# Patient Record
Sex: Female | Born: 1960 | Race: Black or African American | Hispanic: No | Marital: Married | Smoking: Current every day smoker
Health system: Southern US, Community
[De-identification: ages and names within clinical notes are randomized; demographics above are authoritative.]

## PROBLEM LIST (undated history)

## (undated) ENCOUNTER — Emergency Department (HOSPITAL_COMMUNITY): Admission: EM | Payer: 59

## (undated) ENCOUNTER — Emergency Department (HOSPITAL_COMMUNITY): Admission: EM | Payer: BLUE CROSS/BLUE SHIELD | Source: Home / Self Care

## (undated) DIAGNOSIS — R42 Dizziness and giddiness: Secondary | ICD-10-CM

## (undated) DIAGNOSIS — E785 Hyperlipidemia, unspecified: Secondary | ICD-10-CM

## (undated) DIAGNOSIS — K859 Acute pancreatitis without necrosis or infection, unspecified: Secondary | ICD-10-CM

## (undated) DIAGNOSIS — I82409 Acute embolism and thrombosis of unspecified deep veins of unspecified lower extremity: Secondary | ICD-10-CM

## (undated) HISTORY — DX: Acute embolism and thrombosis of unspecified deep veins of unspecified lower extremity: I82.409

## (undated) HISTORY — DX: Hyperlipidemia, unspecified: E78.5

## (undated) HISTORY — PX: ENDOMETRIAL ABLATION W/ NOVASURE: SUR434

## (undated) HISTORY — PX: TUBAL LIGATION: SHX77

---

## 1998-02-18 ENCOUNTER — Inpatient Hospital Stay (HOSPITAL_COMMUNITY): Admission: AD | Admit: 1998-02-18 | Discharge: 1998-02-18 | Payer: Self-pay | Admitting: Obstetrics & Gynecology

## 2000-01-07 ENCOUNTER — Emergency Department (HOSPITAL_COMMUNITY): Admission: EM | Admit: 2000-01-07 | Discharge: 2000-01-07 | Payer: Self-pay | Admitting: Emergency Medicine

## 2001-02-04 ENCOUNTER — Encounter: Payer: Self-pay | Admitting: Emergency Medicine

## 2001-02-04 ENCOUNTER — Emergency Department (HOSPITAL_COMMUNITY): Admission: EM | Admit: 2001-02-04 | Discharge: 2001-02-04 | Payer: Self-pay | Admitting: Emergency Medicine

## 2001-02-15 ENCOUNTER — Encounter: Admission: RE | Admit: 2001-02-15 | Discharge: 2001-02-15 | Payer: Self-pay | Admitting: Family Medicine

## 2001-02-15 ENCOUNTER — Other Ambulatory Visit: Admission: RE | Admit: 2001-02-15 | Discharge: 2001-02-15 | Payer: Self-pay | Admitting: Family Medicine

## 2001-02-23 ENCOUNTER — Encounter: Admission: RE | Admit: 2001-02-23 | Discharge: 2001-05-24 | Payer: Self-pay | Admitting: Family Medicine

## 2001-03-10 ENCOUNTER — Encounter: Admission: RE | Admit: 2001-03-10 | Discharge: 2001-03-10 | Payer: Self-pay | Admitting: Family Medicine

## 2001-07-09 ENCOUNTER — Encounter: Admission: RE | Admit: 2001-07-09 | Discharge: 2001-07-09 | Payer: Self-pay | Admitting: Family Medicine

## 2001-07-14 ENCOUNTER — Encounter: Admission: RE | Admit: 2001-07-14 | Discharge: 2001-07-14 | Payer: Self-pay | Admitting: Family Medicine

## 2002-01-31 ENCOUNTER — Encounter: Admission: RE | Admit: 2002-01-31 | Discharge: 2002-01-31 | Payer: Self-pay | Admitting: Family Medicine

## 2002-02-03 ENCOUNTER — Encounter: Admission: RE | Admit: 2002-02-03 | Discharge: 2002-02-03 | Payer: Self-pay | Admitting: Family Medicine

## 2002-03-11 ENCOUNTER — Encounter: Admission: RE | Admit: 2002-03-11 | Discharge: 2002-03-11 | Payer: Self-pay | Admitting: Family Medicine

## 2002-07-15 ENCOUNTER — Encounter: Admission: RE | Admit: 2002-07-15 | Discharge: 2002-07-15 | Payer: Self-pay | Admitting: Family Medicine

## 2002-10-06 ENCOUNTER — Encounter: Admission: RE | Admit: 2002-10-06 | Discharge: 2002-10-06 | Payer: Self-pay | Admitting: Family Medicine

## 2002-10-11 ENCOUNTER — Encounter: Payer: Self-pay | Admitting: Sports Medicine

## 2002-10-11 ENCOUNTER — Encounter: Admission: RE | Admit: 2002-10-11 | Discharge: 2002-10-11 | Payer: Self-pay | Admitting: Sports Medicine

## 2002-11-08 ENCOUNTER — Encounter: Admission: RE | Admit: 2002-11-08 | Discharge: 2002-11-08 | Payer: Self-pay | Admitting: Sports Medicine

## 2002-11-10 ENCOUNTER — Other Ambulatory Visit: Admission: RE | Admit: 2002-11-10 | Discharge: 2002-11-10 | Payer: Self-pay | Admitting: *Deleted

## 2002-11-22 ENCOUNTER — Ambulatory Visit (HOSPITAL_COMMUNITY): Admission: RE | Admit: 2002-11-22 | Discharge: 2002-11-22 | Payer: Self-pay | Admitting: *Deleted

## 2002-11-22 ENCOUNTER — Encounter (INDEPENDENT_AMBULATORY_CARE_PROVIDER_SITE_OTHER): Payer: Self-pay | Admitting: *Deleted

## 2002-11-29 ENCOUNTER — Encounter: Admission: RE | Admit: 2002-11-29 | Discharge: 2002-11-29 | Payer: Self-pay | Admitting: Family Medicine

## 2002-11-29 ENCOUNTER — Inpatient Hospital Stay (HOSPITAL_COMMUNITY): Admission: AD | Admit: 2002-11-29 | Discharge: 2002-11-29 | Payer: Self-pay | Admitting: Obstetrics and Gynecology

## 2002-11-30 ENCOUNTER — Encounter: Admission: RE | Admit: 2002-11-30 | Discharge: 2002-11-30 | Payer: Self-pay | Admitting: Family Medicine

## 2002-12-08 ENCOUNTER — Encounter: Admission: RE | Admit: 2002-12-08 | Discharge: 2002-12-08 | Payer: Self-pay | Admitting: Family Medicine

## 2003-07-14 ENCOUNTER — Encounter: Admission: RE | Admit: 2003-07-14 | Discharge: 2003-07-14 | Payer: Self-pay | Admitting: Family Medicine

## 2003-08-07 ENCOUNTER — Encounter: Admission: RE | Admit: 2003-08-07 | Discharge: 2003-08-07 | Payer: Self-pay | Admitting: Obstetrics and Gynecology

## 2003-08-18 ENCOUNTER — Encounter: Admission: RE | Admit: 2003-08-18 | Discharge: 2003-08-18 | Payer: Self-pay | Admitting: Sports Medicine

## 2003-09-08 ENCOUNTER — Encounter (INDEPENDENT_AMBULATORY_CARE_PROVIDER_SITE_OTHER): Payer: Self-pay | Admitting: *Deleted

## 2003-09-08 ENCOUNTER — Ambulatory Visit (HOSPITAL_COMMUNITY): Admission: RE | Admit: 2003-09-08 | Discharge: 2003-09-08 | Payer: Self-pay | Admitting: *Deleted

## 2003-11-22 ENCOUNTER — Emergency Department (HOSPITAL_COMMUNITY): Admission: EM | Admit: 2003-11-22 | Discharge: 2003-11-22 | Payer: Self-pay | Admitting: Emergency Medicine

## 2003-12-08 ENCOUNTER — Encounter: Admission: RE | Admit: 2003-12-08 | Discharge: 2003-12-08 | Payer: Self-pay | Admitting: Sports Medicine

## 2004-02-12 ENCOUNTER — Emergency Department (HOSPITAL_COMMUNITY): Admission: EM | Admit: 2004-02-12 | Discharge: 2004-02-12 | Payer: Self-pay | Admitting: Emergency Medicine

## 2004-05-01 ENCOUNTER — Emergency Department (HOSPITAL_COMMUNITY): Admission: EM | Admit: 2004-05-01 | Discharge: 2004-05-01 | Payer: Self-pay | Admitting: Family Medicine

## 2004-05-14 ENCOUNTER — Ambulatory Visit: Payer: Self-pay | Admitting: Sports Medicine

## 2004-08-12 ENCOUNTER — Ambulatory Visit: Payer: Self-pay

## 2004-08-14 ENCOUNTER — Ambulatory Visit: Payer: Self-pay | Admitting: Sports Medicine

## 2004-08-21 ENCOUNTER — Ambulatory Visit: Payer: Self-pay | Admitting: Family Medicine

## 2004-10-16 ENCOUNTER — Ambulatory Visit: Payer: Self-pay | Admitting: Sports Medicine

## 2004-10-29 ENCOUNTER — Ambulatory Visit: Payer: Self-pay | Admitting: Family Medicine

## 2004-11-06 ENCOUNTER — Ambulatory Visit: Payer: Self-pay | Admitting: Family Medicine

## 2004-11-29 ENCOUNTER — Ambulatory Visit: Payer: Self-pay | Admitting: Family Medicine

## 2004-12-05 ENCOUNTER — Encounter: Admission: RE | Admit: 2004-12-05 | Discharge: 2004-12-05 | Payer: Self-pay | Admitting: Family Medicine

## 2004-12-16 ENCOUNTER — Ambulatory Visit: Payer: Self-pay | Admitting: Sports Medicine

## 2005-01-13 ENCOUNTER — Ambulatory Visit (HOSPITAL_COMMUNITY): Admission: RE | Admit: 2005-01-13 | Discharge: 2005-01-13 | Payer: Self-pay | Admitting: Family Medicine

## 2005-01-13 ENCOUNTER — Encounter (INDEPENDENT_AMBULATORY_CARE_PROVIDER_SITE_OTHER): Payer: Self-pay | Admitting: *Deleted

## 2005-01-30 ENCOUNTER — Emergency Department (HOSPITAL_COMMUNITY): Admission: EM | Admit: 2005-01-30 | Discharge: 2005-01-30 | Payer: Self-pay | Admitting: Family Medicine

## 2005-04-30 ENCOUNTER — Ambulatory Visit: Payer: Self-pay | Admitting: Family Medicine

## 2005-09-03 ENCOUNTER — Encounter (INDEPENDENT_AMBULATORY_CARE_PROVIDER_SITE_OTHER): Payer: Self-pay | Admitting: *Deleted

## 2005-09-05 ENCOUNTER — Ambulatory Visit: Payer: Self-pay | Admitting: Family Medicine

## 2005-09-12 ENCOUNTER — Encounter: Admission: RE | Admit: 2005-09-12 | Discharge: 2005-09-12 | Payer: Self-pay | Admitting: Sports Medicine

## 2006-01-16 ENCOUNTER — Ambulatory Visit: Payer: Self-pay | Admitting: Family Medicine

## 2006-02-17 ENCOUNTER — Emergency Department (HOSPITAL_COMMUNITY): Admission: EM | Admit: 2006-02-17 | Discharge: 2006-02-17 | Payer: Self-pay | Admitting: Emergency Medicine

## 2006-06-26 ENCOUNTER — Ambulatory Visit: Payer: Self-pay | Admitting: Family Medicine

## 2006-07-05 ENCOUNTER — Emergency Department (HOSPITAL_COMMUNITY): Admission: EM | Admit: 2006-07-05 | Discharge: 2006-07-05 | Payer: Self-pay | Admitting: Family Medicine

## 2006-07-13 ENCOUNTER — Ambulatory Visit: Payer: Self-pay | Admitting: Family Medicine

## 2006-08-27 DIAGNOSIS — E119 Type 2 diabetes mellitus without complications: Secondary | ICD-10-CM | POA: Insufficient documentation

## 2006-08-27 DIAGNOSIS — E7800 Pure hypercholesterolemia, unspecified: Secondary | ICD-10-CM

## 2006-08-27 DIAGNOSIS — I10 Essential (primary) hypertension: Secondary | ICD-10-CM

## 2006-08-27 DIAGNOSIS — E1121 Type 2 diabetes mellitus with diabetic nephropathy: Secondary | ICD-10-CM | POA: Insufficient documentation

## 2006-08-27 DIAGNOSIS — F17200 Nicotine dependence, unspecified, uncomplicated: Secondary | ICD-10-CM

## 2006-08-27 DIAGNOSIS — E1165 Type 2 diabetes mellitus with hyperglycemia: Secondary | ICD-10-CM | POA: Insufficient documentation

## 2006-08-28 ENCOUNTER — Encounter (INDEPENDENT_AMBULATORY_CARE_PROVIDER_SITE_OTHER): Payer: Self-pay | Admitting: *Deleted

## 2006-08-31 ENCOUNTER — Ambulatory Visit: Payer: Self-pay | Admitting: Family Medicine

## 2006-09-10 ENCOUNTER — Ambulatory Visit: Payer: Self-pay | Admitting: Family Medicine

## 2006-09-14 ENCOUNTER — Encounter: Payer: Self-pay | Admitting: *Deleted

## 2006-09-29 ENCOUNTER — Telehealth: Payer: Self-pay | Admitting: *Deleted

## 2006-10-28 ENCOUNTER — Ambulatory Visit: Payer: Self-pay | Admitting: Family Medicine

## 2006-10-28 LAB — CONVERTED CEMR LAB: Hgb A1c MFr Bld: 7.6 %

## 2006-12-01 ENCOUNTER — Emergency Department (HOSPITAL_COMMUNITY): Admission: EM | Admit: 2006-12-01 | Discharge: 2006-12-01 | Payer: Self-pay | Admitting: Emergency Medicine

## 2006-12-02 ENCOUNTER — Emergency Department (HOSPITAL_COMMUNITY): Admission: EM | Admit: 2006-12-02 | Discharge: 2006-12-02 | Payer: Self-pay | Admitting: Emergency Medicine

## 2006-12-02 ENCOUNTER — Telehealth: Payer: Self-pay | Admitting: *Deleted

## 2006-12-04 ENCOUNTER — Ambulatory Visit: Payer: Self-pay | Admitting: Sports Medicine

## 2006-12-07 ENCOUNTER — Ambulatory Visit: Payer: Self-pay | Admitting: Sports Medicine

## 2006-12-22 ENCOUNTER — Ambulatory Visit: Payer: Self-pay | Admitting: Sports Medicine

## 2007-05-20 ENCOUNTER — Telehealth (INDEPENDENT_AMBULATORY_CARE_PROVIDER_SITE_OTHER): Payer: Self-pay | Admitting: Family Medicine

## 2007-06-07 ENCOUNTER — Telehealth (INDEPENDENT_AMBULATORY_CARE_PROVIDER_SITE_OTHER): Payer: Self-pay | Admitting: Family Medicine

## 2007-06-25 ENCOUNTER — Emergency Department (HOSPITAL_COMMUNITY): Admission: EM | Admit: 2007-06-25 | Discharge: 2007-06-25 | Payer: Self-pay | Admitting: Family Medicine

## 2007-07-05 ENCOUNTER — Ambulatory Visit: Payer: Self-pay | Admitting: Sports Medicine

## 2007-07-05 DIAGNOSIS — E049 Nontoxic goiter, unspecified: Secondary | ICD-10-CM | POA: Insufficient documentation

## 2007-07-09 ENCOUNTER — Encounter (INDEPENDENT_AMBULATORY_CARE_PROVIDER_SITE_OTHER): Payer: Self-pay | Admitting: Family Medicine

## 2007-07-13 ENCOUNTER — Emergency Department (HOSPITAL_COMMUNITY): Admission: EM | Admit: 2007-07-13 | Discharge: 2007-07-13 | Payer: Self-pay | Admitting: Family Medicine

## 2007-08-31 ENCOUNTER — Encounter (INDEPENDENT_AMBULATORY_CARE_PROVIDER_SITE_OTHER): Payer: Self-pay | Admitting: Family Medicine

## 2007-09-21 ENCOUNTER — Other Ambulatory Visit: Admission: RE | Admit: 2007-09-21 | Discharge: 2007-09-21 | Payer: Self-pay | Admitting: Family Medicine

## 2007-09-21 ENCOUNTER — Ambulatory Visit: Payer: Self-pay | Admitting: Family Medicine

## 2007-09-21 ENCOUNTER — Encounter (INDEPENDENT_AMBULATORY_CARE_PROVIDER_SITE_OTHER): Payer: Self-pay | Admitting: Family Medicine

## 2007-09-23 ENCOUNTER — Encounter: Admission: RE | Admit: 2007-09-23 | Discharge: 2007-09-23 | Payer: Self-pay | Admitting: Family Medicine

## 2007-09-24 ENCOUNTER — Ambulatory Visit: Payer: Self-pay | Admitting: Family Medicine

## 2007-09-24 ENCOUNTER — Encounter (INDEPENDENT_AMBULATORY_CARE_PROVIDER_SITE_OTHER): Payer: Self-pay | Admitting: Family Medicine

## 2007-09-27 ENCOUNTER — Encounter (INDEPENDENT_AMBULATORY_CARE_PROVIDER_SITE_OTHER): Payer: Self-pay | Admitting: Family Medicine

## 2007-09-28 LAB — CONVERTED CEMR LAB
Albumin: 4.1 g/dL (ref 3.5–5.2)
CO2: 20 meq/L (ref 19–32)
Cholesterol: 183 mg/dL (ref 0–200)
Glucose, Bld: 166 mg/dL — ABNORMAL HIGH (ref 70–99)
HDL: 33 mg/dL — ABNORMAL LOW (ref >39)
LDL Cholesterol: 98 mg/dL (ref 0–99)
Potassium: 4.6 meq/L (ref 3.5–5.3)
Sodium: 138 meq/L (ref 135–145)
TSH: 1.14 microintl units/mL (ref 0.350–5.50)
Total Bilirubin: 0.3 mg/dL (ref 0.3–1.2)
Total CHOL/HDL Ratio: 5.5

## 2007-10-15 ENCOUNTER — Encounter: Admission: RE | Admit: 2007-10-15 | Discharge: 2007-10-15 | Payer: Self-pay | Admitting: Family Medicine

## 2007-11-08 ENCOUNTER — Telehealth (INDEPENDENT_AMBULATORY_CARE_PROVIDER_SITE_OTHER): Payer: Self-pay | Admitting: *Deleted

## 2007-11-29 ENCOUNTER — Telehealth: Payer: Self-pay | Admitting: *Deleted

## 2008-07-17 ENCOUNTER — Emergency Department (HOSPITAL_COMMUNITY): Admission: EM | Admit: 2008-07-17 | Discharge: 2008-07-17 | Payer: Self-pay | Admitting: Family Medicine

## 2008-07-31 ENCOUNTER — Emergency Department (HOSPITAL_COMMUNITY): Admission: EM | Admit: 2008-07-31 | Discharge: 2008-07-31 | Payer: Self-pay | Admitting: Family Medicine

## 2008-08-09 ENCOUNTER — Telehealth (INDEPENDENT_AMBULATORY_CARE_PROVIDER_SITE_OTHER): Payer: Self-pay | Admitting: Family Medicine

## 2008-11-14 ENCOUNTER — Emergency Department (HOSPITAL_COMMUNITY): Admission: EM | Admit: 2008-11-14 | Discharge: 2008-11-14 | Payer: Self-pay | Admitting: Family Medicine

## 2008-12-13 ENCOUNTER — Telehealth: Payer: Self-pay | Admitting: *Deleted

## 2008-12-20 ENCOUNTER — Ambulatory Visit: Payer: Self-pay | Admitting: Family Medicine

## 2008-12-20 ENCOUNTER — Encounter (INDEPENDENT_AMBULATORY_CARE_PROVIDER_SITE_OTHER): Payer: Self-pay | Admitting: Family Medicine

## 2008-12-20 LAB — CONVERTED CEMR LAB
ALT: 15 units/L (ref 0–35)
AST: 13 units/L (ref 0–37)
Albumin: 4.3 g/dL (ref 3.5–5.2)
BUN: 15 mg/dL (ref 6–23)
Calcium: 9.1 mg/dL (ref 8.4–10.5)
Cholesterol: 211 mg/dL — ABNORMAL HIGH (ref 0–200)
Creatinine, Ser: 0.91 mg/dL (ref 0.40–1.20)
Glucose, Bld: 254 mg/dL — ABNORMAL HIGH (ref 70–99)
Hgb A1c MFr Bld: 11 %
Potassium: 4.5 meq/L (ref 3.5–5.3)
TSH: 0.672 microintl units/mL (ref 0.350–4.500)

## 2008-12-21 ENCOUNTER — Telehealth (INDEPENDENT_AMBULATORY_CARE_PROVIDER_SITE_OTHER): Payer: Self-pay | Admitting: Family Medicine

## 2009-01-08 ENCOUNTER — Emergency Department (HOSPITAL_COMMUNITY): Admission: EM | Admit: 2009-01-08 | Discharge: 2009-01-08 | Payer: Self-pay | Admitting: Family Medicine

## 2009-01-18 ENCOUNTER — Emergency Department (HOSPITAL_COMMUNITY): Admission: EM | Admit: 2009-01-18 | Discharge: 2009-01-19 | Payer: Self-pay | Admitting: Emergency Medicine

## 2009-04-09 ENCOUNTER — Emergency Department (HOSPITAL_COMMUNITY): Admission: EM | Admit: 2009-04-09 | Discharge: 2009-04-10 | Payer: Self-pay | Admitting: Emergency Medicine

## 2009-07-23 ENCOUNTER — Emergency Department (HOSPITAL_COMMUNITY): Admission: EM | Admit: 2009-07-23 | Discharge: 2009-07-23 | Payer: Self-pay | Admitting: Emergency Medicine

## 2009-08-23 ENCOUNTER — Telehealth: Payer: Self-pay | Admitting: Family Medicine

## 2009-08-24 ENCOUNTER — Ambulatory Visit: Payer: Self-pay | Admitting: Family Medicine

## 2009-09-07 ENCOUNTER — Ambulatory Visit: Payer: Self-pay | Admitting: Family Medicine

## 2009-09-07 DIAGNOSIS — J301 Allergic rhinitis due to pollen: Secondary | ICD-10-CM

## 2009-09-07 LAB — CONVERTED CEMR LAB: Hgb A1c MFr Bld: 12.1 %

## 2010-05-15 ENCOUNTER — Telehealth: Payer: Self-pay | Admitting: Family Medicine

## 2010-06-07 ENCOUNTER — Ambulatory Visit: Payer: Self-pay | Admitting: Family Medicine

## 2010-06-07 LAB — CONVERTED CEMR LAB: Hgb A1c MFr Bld: 12.2 %

## 2010-06-10 ENCOUNTER — Telehealth: Payer: Self-pay | Admitting: Family Medicine

## 2010-06-12 ENCOUNTER — Telehealth: Payer: Self-pay | Admitting: Family Medicine

## 2010-07-20 ENCOUNTER — Encounter: Payer: Self-pay | Admitting: Sports Medicine

## 2010-07-20 ENCOUNTER — Encounter: Payer: Self-pay | Admitting: Obstetrics and Gynecology

## 2010-07-21 ENCOUNTER — Encounter: Payer: Self-pay | Admitting: Sports Medicine

## 2010-07-30 NOTE — Progress Notes (Signed)
Summary: refill  Phone Note Refill Request Call back at [phone] Message from:  Patient  Refills Requested: Medication #1:  LISINOPRIL 20 MG TABS 2 tablet by mouth daily for blood pressure. [facility] has a stated they have been calling for several days and no response... pt is now out  Initial call taken by: De Nurse,  August 23, 2009 2:33 PM  Follow-up for Phone Call        we have no record of [facility] sending requests. I filled enough for 4 days & called pt to get her to come in. she will be here at 8:30am tomorow. she has not been coming in as she has no insurance. told her about [facility]. she will come in the am so she can get refills on meds. told her to bring at least $5 towards the bill. states she could do that. Follow-up by: Golden Circle RN,  August 23, 2009 2:39 PM    Prescriptions: LISINOPRIL 20 MG TABS (LISINOPRIL) 2 tablet by mouth daily for blood pressure  #8 tabs x 0   Entered by:   Golden Circle RN   Authorized by:   Helane Rima DO   Signed by:   Golden Circle RN on 08/23/2009   Method used:   Electronically to        [facility] (retail)       [address]       [place], [place]  [address]       Ph: [phone]       Fax: [phone]   RxID:   [id number]

## 2010-07-30 NOTE — Assessment & Plan Note (Signed)
Summary: meet doc/med refills/eo   Vital Signs:  Patient profile:   50 year old female Height:      66.5 inches Weight:      194 pounds BMI:     30.95 Temp:     98.2 degrees F oral Pulse rate:   100 / minute BP sitting:   123 / 87  (left arm) Cuff size:   regular  Vitals Entered By: Gladstone Pih (September 07, 2009 3:27 PM) CC: Meet New Doctor Is Patient Diabetic? Yes Did you bring your meter with you today? No Pain Assessment Patient in pain? no        Primary Care Provider:  Helane Rima DO  CC:  Meet New Doctor.  History of Present Illness: 50 yo with:  1.  HTN: Rx Lisinopril 40mg  daily.  No side effects.  Does not check BP at home.  Needs meds refilled.  No CP or shortness of breath.  2.  DM2: Uses Metformin intermittently as she does not like to take medications. She has not been watching diet or exercising.  Has not checked CBGs recently.    3.  HLD: Not taking pravastation.  Wants to wait on checking lipids.    Habits & Providers  Alcohol-Tobacco-Diet     Tobacco Status: current     Tobacco Counseling: to quit use of tobacco products     Cigarette Packs/Day: 0.5  Current Medications (verified): 1)  Bayer Childrens Aspirin 81 Mg Chew (Aspirin) .... Take 1 Tablet By Mouth Once A Day 2)  Metformin Hcl 1000 Mg  Tabs (Metformin Hcl) .Marland Kitchen.. 1 Tablet By Mouth Bid 3)  Ibuprofen 800 Mg  Tabs (Ibuprofen) .Marland Kitchen.. 1 Tablet By Mouth Two Times A Day As Needed For Pain 4)  Pravastatin Sodium 40 Mg  Tabs (Pravastatin Sodium) .Marland Kitchen.. 1 Tablet By Mouth Daily 5)  Lisinopril 20 Mg Tabs (Lisinopril) .... 2 Tablet By Mouth Daily For Blood Pressure  Allergies (verified): No Known Drug Allergies  Past History:  Past Medical History: Breast abscess (Left) 10/2002 tx`d with doxy [id number] - h/o TAB HTN HLD DM2 Goiter Tobacco Abuse Seasonal Allergies PMH-FH-SH reviewed for relevance  Social History: Smokes 1/2 ppd x 25 yrs.  Occas [employer]; single with 2 children; Currently  unemployed as of 06/2008.  Review of Systems General:  Denies chills and fever. CV:  Denies chest pain or discomfort and shortness of breath with exertion. Resp:  Denies cough and shortness of breath. GI:  Denies change in bowel habits. Derm:  Denies lesion(s) and rash. Neuro:  Denies headaches, numbness, and tingling. Psych:  Denies anxiety and depression. Allergy:  Complains of seasonal allergies.  Physical Exam  General:  Well-developed, well-nourished, in no acute distress; alert, appropriate and cooperative throughout examination. Vitals reviewed. Ears:  R ear normal and L ear normal.   Neck:  No deformities, masses, or tenderness noted. Lungs:  Normal respiratory effort, chest expands symmetrically. Lungs are clear to auscultation, no crackles or wheezes. Heart:  Normal rate and regular rhythm. S1 and S2 normal without gallop, murmur, click, rub or other extra sounds. Pulses:  R and L dorsalis pedis and posterior tibial pulses are full and equal bilaterally. Extremities:  No edema. Psych:  Oriented X3 and memory intact for recent and remote.  Slightly defensive.  Diabetes Management Exam:    Foot Exam (with socks and/or shoes not present):       Sensory-Pinprick/Light touch:          Left medial foot (L-4):  normal          Left dorsal foot (L-5): normal          Left lateral foot (S-1): normal          Right medial foot (L-4): normal          Right dorsal foot (L-5): normal          Right lateral foot (S-1): normal       Sensory-Monofilament:          Left foot: normal          Right foot: normal       Inspection:          Left foot: normal          Right foot: normal       Nails:          Left foot: normal          Right foot: normal   Impression & Recommendations:  Problem # 1:  DIABETES MELLITUS II, UNCOMPLICATED (ICD-250.00) Assessment Deteriorated The patient has been taking Metformin only when she thinks that she "needs it." She admits that she only takes it  every other day or so. She admits to eating high sugar foods and not exercising. I explained that a HgbA1c > 10 may require more that one medication. She declined adding another medication, but stated that she would start taking her Metformin more regularly. She also declined nutrition education. Her updated medication list for this problem includes:    Bayer Childrens Aspirin 81 Mg Chew (Aspirin) .Marland Kitchen... Take 1 tablet by mouth once a day    Metformin Hcl 1000 Mg Tabs (Metformin hcl) .Marland Kitchen... 1 tablet by mouth bid    Lisinopril 20 Mg Tabs (Lisinopril) .Marland Kitchen... 2 tablet by mouth daily for blood pressure  Orders: A1C-[facility] (08657) [facility]- Est  Level 4 (99214)Future Orders: Vit D, 25 OH-[facility] ([id number]) ... 09/07/2010  Problem # 2:  HYPERTENSION, BENIGN SYSTEMIC (ICD-401.1) Assessment: Unchanged  Her updated medication list for this problem includes:    Lisinopril 20 Mg Tabs (Lisinopril) .Marland Kitchen... 2 tablet by mouth daily for blood pressure  Orders: [facility]- Est  Level 4 (99214)Future Orders: Comp Met-[facility] ([id number]) ... 08/30/2010 Vit D, 25 [facility] ([id number]) ... 09/07/2010  Problem # 3:  HYPERCHOLESTEROLEMIA (ICD-272.0) Assessment: Unchanged Encouraged patient to restart medication. We will recheck her FLP in 3 months. Her updated medication list for this problem includes:    Pravastatin Sodium 40 Mg Tabs (Pravastatin sodium) .Marland Kitchen... 1 tablet by mouth daily  Orders: [facility]- Est  Level 4 (99214)Future Orders: Lipid-[facility] ([id number]) ... 09/13/2010  Problem # 4:  TOBACCO DEPENDENCE (ICD-305.1) Assessment: Unchanged Encouraged cessation. Provided information on free Smoking Cessation Classes. Orders: [facility]- Est  Level 4 (99214)Future Orders: Vit D, 25 OH-[facility] ([id number]) ... 09/07/2010  Problem # 5:  ALLERGIC RHINITIS, SEASONAL (ICD-477.0) Assessment: New Rec daily Zyrtec. However, the patient declines as she does not want to take a daily medication.  Complete Medication List: 1)  Bayer Childrens  Aspirin 81 Mg Chew (Aspirin) .... Take 1 tablet by mouth once a day 2)  Metformin Hcl 1000 Mg Tabs (Metformin hcl) .Marland Kitchen.. 1 tablet by mouth bid 3)  Ibuprofen 800 Mg Tabs (Ibuprofen) .Marland Kitchen.. 1 tablet by mouth two times a day as needed for pain 4)  Pravastatin Sodium 40 Mg Tabs (Pravastatin sodium) .Marland Kitchen.. 1 tablet by mouth daily 5)  Lisinopril 20 Mg Tabs (Lisinopril) .... 2 tablet by mouth daily for blood pressure  Other  Orders: Future Orders: TSH-[facility] ([id number]) ... 09/06/2010  Patient Instructions: 1)  Get [place]. 2)  Smoking Cessation Classes. 3)  Make an appt 3 - 6 month for Physical with Pap. Prescriptions: LISINOPRIL 20 MG TABS (LISINOPRIL) 2 tablet by mouth daily for blood pressure  #60 x 6   Entered and Authorized by:   Helane Rima DO   Signed by:   Helane Rima DO on 09/07/2009   Method used:   Electronically to        [facility] (retail)       [address]       [place], [place]  [address]       Ph: [phone]       Fax: [phone]   RxID:   [id number] PRAVASTATIN SODIUM 40 MG  TABS (PRAVASTATIN SODIUM) 1 tablet by mouth daily  #30 x 6   Entered and Authorized by:   Helane Rima DO   Signed by:   Helane Rima DO on 09/07/2009   Method used:   Electronically to        [facility] (retail)       [address]       [place], [place]  [address]       Ph: [phone]       Fax: [phone]   RxID:   [id number] IBUPROFEN 800 MG  TABS (IBUPROFEN) 1 tablet by mouth two times a day as needed for pain  #60 x 6   Entered and Authorized by:   Helane Rima DO   Signed by:   Helane Rima DO on 09/07/2009   Method used:   Electronically to        [facility] (retail)       [address]       [place], [place]  [address]       Ph: [phone]       Fax: [phone]   RxID:   [id number] METFORMIN HCL 1000 MG  TABS (METFORMIN HCL) 1 tablet by mouth bid  #60 x 6    Entered and Authorized by:   Helane Rima DO   Signed by:   Helane Rima DO on 09/07/2009   Method used:   Electronically to        [facility] (retail)       [address]       [place], [place]  [address]       Ph: [phone]       Fax: [phone]   RxID:   [id number]   Prevention & Chronic Care Immunizations   Influenza vaccine: Not documented   Influenza vaccine deferral: Refused  (09/07/2009)    Tetanus booster: Not documented   Td booster deferral: Refused  (09/07/2009)    Pneumococcal vaccine: Not documented  Other Screening   Pap smear: normal  (09/21/2007)   Pap smear action/deferral: Deferred  (09/07/2009)   Pap smear due: 09/20/2008    Mammogram: normal  (10/15/2007)   Mammogram action/deferral: Deferred  (09/07/2009)   Mammogram due: 10/14/2008   Smoking status: current  (09/07/2009)   Smoking cessation counseling: yes  (09/21/2007)  Diabetes Mellitus   HgbA1C: 12.1  (09/07/2009)   Hemoglobin A1C due: 10/03/2007    Eye exam: normal  (08/18/2007)  Diabetic eye exam action/deferral: Refused  (09/07/2009)   Eye exam due: 08/17/2008    Foot exam: yes  (09/07/2009)   High risk foot: Not documented   Foot care education: Not documented    Urine microalbumin/creatinine ratio: Not documented   Urine microalbumin action/deferral: Not indicated    Diabetes flowsheet reviewed?: Yes   Progress toward A1C goal: Deteriorated  Lipids   Total Cholesterol: 211  (12/20/2008)   LDL: See Comment mg/dL  (16/03/9603)   LDL Direct: Not documented   HDL: 31  (12/20/2008)   Triglycerides: 454  (12/20/2008)    SGOT (AST): 13  (12/20/2008)   SGPT (ALT): 15  (12/20/2008) CMP ordered    Alkaline phosphatase: 86  (12/20/2008)   Total bilirubin: 0.3  (12/20/2008)    Lipid flowsheet reviewed?: Yes   Progress toward LDL goal: Unchanged  Hypertension   Last Blood Pressure: 123 / 87  (09/07/2009)   Serum creatinine: 0.91   (12/20/2008)   Serum potassium 4.5  (12/20/2008) CMP ordered     Hypertension flowsheet reviewed?: Yes   Progress toward BP goal: At goal  Self-Management Support :   Personal Goals (by the next clinic visit) :     Personal A1C goal: 7  (09/07/2009)     Personal blood pressure goal: 130/80  (09/07/2009)     Personal LDL goal: 100  (09/07/2009)    Patient will work on the following items until the next clinic visit to reach self-care goals:     Medications and monitoring: take my medicines every day, bring all of my medications to every visit  (09/07/2009)     Eating: drink diet soda or water instead of juice or soda, eat more vegetables, use fresh or frozen vegetables, eat foods that are low in salt, eat baked foods instead of fried foods, eat fruit for snacks and desserts, limit or avoid alcohol  (09/07/2009)     Activity: take a 30 minute walk every day, take the stairs instead of the elevator, park at the far end of the parking lot  (09/07/2009)    Diabetes self-management support: Written self-care plan  (09/07/2009)   Diabetes care plan printed    Hypertension self-management support: Written self-care plan  (09/07/2009)   Hypertension self-care plan printed.    Lipid self-management support: Written self-care plan  (09/07/2009)   Lipid self-care plan printed.  Laboratory Results   Blood Tests   Date/Time Received: September 07, 2009 3:52 PM  Date/Time Reported: September 07, 2009 4:01 PM   HGBA1C: 12.1%   (Normal Range: Non-Diabetic - 3-6%   Control Diabetic - 6-8%)  Comments: ...........test performed by...........Marland KitchenTerese Door, CMA

## 2010-07-30 NOTE — Assessment & Plan Note (Signed)
Summary: dm/eo   Vital Signs:  Patient profile:   50 year old female Weight:      194.3 pounds Pulse rate:   80 / minute BP sitting:   124 / 80  (right arm) Cuff size:   large  Vitals Entered By: Arlyss Repress CMA, (June 07, 2010 8:37 AM) CC: f/up DM, HTN, HLD. refill meds. Is Patient Diabetic? Yes Pain Assessment Patient in pain? no        Primary Care Provider:  Helane Rima DO  CC:  f/up DM, HTN, and HLD. refill meds..  History of Present Illness: 50 yo with:  1.  HTN: Rx Lisinopril 40mg  daily.  No side effects.  Does not check BP at home.  Needs meds refilled.  No CP or shortness of breath.  2.  DM2: Uses Metformin intermittently as she does not like to take medications. She has not been watching diet or exercising.  Has not checked CBGs recently.    3.  HLD: Taking pravastation.  Denies myalgias.    Habits & Providers  Alcohol-Tobacco-Diet     Tobacco Status: current     Tobacco Counseling: to quit use of tobacco products     Cigarette Packs/Day: 0.5  Current Medications (verified): 1)  Bayer Childrens Aspirin 81 Mg Chew (Aspirin) .... Take 1 Tablet By Mouth Once A Day 2)  Metformin Hcl 1000 Mg  Tabs (Metformin Hcl) .Marland Kitchen.. 1 Tablet By Mouth Bid 3)  Ibuprofen 800 Mg  Tabs (Ibuprofen) .Marland Kitchen.. 1 Tablet By Mouth Two Times A Day As Needed For Pain 4)  Pravastatin Sodium 40 Mg  Tabs (Pravastatin Sodium) .Marland Kitchen.. 1 Tablet By Mouth Daily 5)  Lisinopril 20 Mg Tabs (Lisinopril) .... 2 Tablet By Mouth Daily For Blood Pressure  Allergies (verified): No Known Drug Allergies PMH-FH-SH reviewed for relevance  Review of Systems General:  Denies chills and fever. CV:  Denies chest pain or discomfort, palpitations, and shortness of breath with exertion. GI:  Denies abdominal pain, constipation, diarrhea, nausea, and vomiting. Neuro:  Denies poor balance and tingling. Endo:  Denies cold intolerance, excessive hunger, excessive thirst, excessive urination, heat intolerance, and  weight change.  Physical Exam  General:  Well-developed, well-nourished, in no acute distress; alert, appropriate and cooperative throughout examination. Vitals reviewed. Lungs:  Normal respiratory effort, chest expands symmetrically. Lungs are clear to auscultation, no crackles or wheezes. Heart:  Normal rate and regular rhythm. S1 and S2 normal without gallop, murmur, click, rub or other extra sounds. Pulses:  R and L dorsalis pedis and posterior tibial pulses are full and equal bilaterally.  Diabetes Management Exam:    Foot Exam (with socks and/or shoes not present):       Sensory-Pinprick/Light touch:          Left medial foot (L-4): normal          Left dorsal foot (L-5): normal          Left lateral foot (S-1): normal          Right medial foot (L-4): normal          Right dorsal foot (L-5): normal          Right lateral foot (S-1): normal       Sensory-Monofilament:          Left foot: normal          Right foot: normal       Inspection:          Left foot:  normal          Right foot: normal       Nails:          Left foot: normal          Right foot: normal   Impression & Recommendations:  Problem # 1:  DIABETES MELLITUS II, UNCOMPLICATED (ICD-250.00) Assessment Deteriorated Discussed A1c = 12.2 which averages around 320. Patient states that she thought she could tell when her BG high. Patient endorses wanting to restart medications, work on DM diet, attend DM education again. We discussed these issues at length. See Patient Instructions. Her updated medication list for this problem includes:    Bayer Childrens Aspirin 81 Mg Chew (Aspirin) .Marland Kitchen... Take 1 tablet by mouth once a day    Metformin Hcl 1000 Mg Tabs (Metformin hcl) .Marland Kitchen... 1 tablet by mouth bid    Lisinopril 20 Mg Tabs (Lisinopril) .Marland Kitchen... 2 tablet by mouth daily for blood pressure  Orders: A1C-[facility] ([address])  Problem # 2:  HYPERTENSION, BENIGN SYSTEMIC (ICD-401.1) Assessment: Unchanged  Her updated medication  list for this problem includes:    Lisinopril 20 Mg Tabs (Lisinopril) .Marland Kitchen... 2 tablet by mouth daily for blood pressure  Problem # 3:  HYPERCHOLESTEROLEMIA (ICD-272.0) Assessment: Unchanged Continue current treatment. Her updated medication list for this problem includes:    Pravastatin Sodium 40 Mg Tabs (Pravastatin sodium) .Marland Kitchen... 1 tablet by mouth daily  Problem # 4:  TOBACCO DEPENDENCE (ICD-305.1) Assessment: Unchanged Discussed smoking cessation.  Complete Medication List: 1)  Bayer Childrens Aspirin 81 Mg Chew (Aspirin) .... Take 1 tablet by mouth once a day 2)  Metformin Hcl 1000 Mg Tabs (Metformin hcl) .Marland Kitchen.. 1 tablet by mouth bid 3)  Ibuprofen 800 Mg Tabs (Ibuprofen) .Marland Kitchen.. 1 tablet by mouth two times a day as needed for pain 4)  Pravastatin Sodium 40 Mg Tabs (Pravastatin sodium) .Marland Kitchen.. 1 tablet by mouth daily 5)  Lisinopril 20 Mg Tabs (Lisinopril) .... 2 tablet by mouth daily for blood pressure  Patient Instructions: 1)  It was nice to see you today. 2)  Work on getting [employer]. 3)  Restart your medications. 4)  Walk 30 minutes, 4 times per week. 5)  Record your blood sugars at least every morning. 6)  Eat every 3-4 hours, limit sugar and carbohydrates. 7)  Follow-up in 1 month after you have [facility] for a FULL PHYSICAL WITH PAP. 8)  We will also send you to see the nutritionist when you have [facility].   Orders Added: 1)  A1C-[facility] [[id number]]    Laboratory Results   Blood Tests   Date/Time Received: June 07, 2010 8:39 AM  Date/Time Reported: June 07, 2010 9:10 AM   HGBA1C: 12.2%   (Normal Range: Non-Diabetic - 3-6%   Control Diabetic - 6-8%)  Comments: ...............test performed by......Marland KitchenBonnie A. [place], MLS (ASCP)cm      Prevention & Chronic Care Immunizations   Influenza vaccine: Not documented   Influenza vaccine deferral: Refused  (09/07/2009)    Tetanus booster: Not documented   Td booster deferral: Refused  (09/07/2009)    Pneumococcal  vaccine: Not documented  Other Screening   Pap smear: normal  (09/21/2007)   Pap smear action/deferral: Deferred  (09/07/2009)   Pap smear due: 09/20/2008    Mammogram: normal  (10/15/2007)   Mammogram action/deferral: Deferred  (09/07/2009)   Mammogram due: 10/14/2008   Smoking status: current  (06/07/2010)   Smoking cessation counseling: yes  (09/21/2007)  Diabetes Mellitus  HgbA1C: 12.2  (06/07/2010)   Hemoglobin A1C due: 10/03/2007    Eye exam: normal  (08/18/2007)   Diabetic eye exam action/deferral: Refused  (09/07/2009)   Eye exam due: 08/17/2008    Foot exam: yes  (06/07/2010)   High risk foot: Not documented   Foot care education: Not documented    Urine microalbumin/creatinine ratio: Not documented   Urine microalbumin action/deferral: Not indicated    Diabetes flowsheet reviewed?: Yes   Progress toward A1C goal: Deteriorated  Lipids   Total Cholesterol: 211  (12/20/2008)   LDL: See Comment mg/dL  (04/26/2535)   LDL Direct: Not documented   HDL: 31  (12/20/2008)   Triglycerides: 454  (12/20/2008)    SGOT (AST): 13  (12/20/2008)   SGPT (ALT): 15  (12/20/2008)   Alkaline phosphatase: 86  (12/20/2008)   Total bilirubin: 0.3  (12/20/2008)    Lipid flowsheet reviewed?: Yes   Progress toward LDL goal: Unchanged  Hypertension   Last Blood Pressure: 124 / 80  (06/07/2010)   Serum creatinine: 0.91  (12/20/2008)   Serum potassium 4.5  (12/20/2008)    Hypertension flowsheet reviewed?: Yes   Progress toward BP goal: At goal  Self-Management Support :   Personal Goals (by the next clinic visit) :     Personal A1C goal: 7  (09/07/2009)     Personal blood pressure goal: 130/80  (09/07/2009)     Personal LDL goal: 100  (09/07/2009)    Patient will work on the following items until the next clinic visit to reach self-care goals:     Medications and monitoring: take my medicines every day, bring all of my medications to every visit  (06/07/2010)     Eating:  drink diet soda or water instead of juice or soda, eat more vegetables, use fresh or frozen vegetables, eat foods that are low in salt, eat baked foods instead of fried foods, eat fruit for snacks and desserts, limit or avoid alcohol  (06/07/2010)     Activity: take a 30 minute walk every day, take the stairs instead of the elevator, park at the far end of the parking lot  (06/07/2010)    Diabetes self-management support: CBG self-monitoring log, Written self-care plan  (06/07/2010)   Diabetes care plan printed    Hypertension self-management support: Written self-care plan  (06/07/2010)   Hypertension self-care plan printed.    Lipid self-management support: Written self-care plan  (06/07/2010)   Lipid self-care plan printed.  Appended Document: Orders Update    Clinical Lists Changes  Orders: Added new Test order of [facility]- Est  Level 4 ([address]403) - Signed

## 2010-07-30 NOTE — Progress Notes (Signed)
----   Converted from flag ---- ---- 05/14/2010 5:52 PM, Milinda Antis MD wrote: Please call pt and let her know whe must make an appt with Dr. Earlene Plater before any medications will be given  Phone [phone] ------------------------------  called pt and told her that she needs to come in for an office visit before any meds will be refilled. she says she is completely out of her BP meds and would like enough until she can be seen. Pt has appt with Earlene Plater Dec 2.Molly Maduro [facility] CMA  May 15, 2010 4:06 PM Prescriptions: LISINOPRIL 20 MG TABS (LISINOPRIL) 2 tablet by mouth daily for blood pressure  #30 x 0   Entered and Authorized by:   Milinda Antis MD   Signed by:   Milinda Antis MD on 05/15/2010   Method used:   Electronically to        [facility] (retail)       [address]       [place], [place]  [address]       Ph: [phone]       Fax: [phone]   RxID:   [id number]   Appended Document:  called pt informed of Rx sent to pharmacy.

## 2010-07-30 NOTE — Assessment & Plan Note (Signed)
Summary: needs meds refilled/[place]    Complete Medication List: 1)  Bayer Childrens Aspirin 81 Mg Chew (Aspirin) .... Take 1 tablet by mouth once a day 2)  Metformin Hcl 1000 Mg Tabs (Metformin hcl) .Marland Kitchen.. 1 tablet by mouth bid 3)  Ibuprofen 800 Mg Tabs (Ibuprofen) .Marland Kitchen.. 1 tablet by mouth two times a day as needed for pain 4)  Pravastatin Sodium 40 Mg Tabs (Pravastatin sodium) .Marland Kitchen.. 1 tablet by mouth daily 5)  Lisinopril 20 Mg Tabs (Lisinopril) .... 2 tablet by mouth daily for blood pressure  Other Orders: No Charge Patient Arrived (NCPA0) (NCPA0)

## 2010-08-01 NOTE — Progress Notes (Signed)
Summary: meds prob  Phone Note Refill Request Call back at Home Phone [phone] Message from:  Patient  Refills Requested: Medication #1:  METFORMIN HCL 1000 MG  TABS 1 tablet by mouth bid  Medication #2:  IBUPROFEN 800 MG  TABS 1 tablet by mouth two times a day as needed for pain   Notes: used for her periods  Medication #3:  PRAVASTATIN SODIUM 40 MG  TABS 1 tablet by mouth daily  Medication #4:  LISINOPRIL 20 MG TABS 2 tablet by mouth daily for blood pressure. pt states that this was supposed to get filled last Friday- hasn't had any Lisinopril all weekend  Initial call taken by: De Nurse,  June 10, 2010 2:00 PM  Follow-up for Phone Call        rx refilled for metformin, pravastatin, lisinopril one time  . will send message to MD about ibuprofen and additioanl refills on three other three meds if she wants .  Follow-up by: Theresia Lo RN,  June 10, 2010 2:19 PM    Prescriptions: LISINOPRIL 20 MG TABS (LISINOPRIL) 2 tablet by mouth daily for blood pressure  #30 x 6   Entered and Authorized by:   Helane Rima DO   Signed by:   Helane Rima DO on 06/10/2010   Method used:   Electronically to        [facility] (retail)       [address]       [place], [place]  [address]       Ph: [phone]       Fax: [phone]   RxID:   [id number] PRAVASTATIN SODIUM 40 MG  TABS (PRAVASTATIN SODIUM) 1 tablet by mouth daily  #30 x 6   Entered and Authorized by:   Helane Rima DO   Signed by:   Helane Rima DO on 06/10/2010   Method used:   Electronically to        [facility] (retail)       [address]       [place], [place]  [address]       Ph: [phone]       Fax: [phone]   RxID:   [id number] IBUPROFEN 800 MG  TABS (IBUPROFEN) 1 tablet by mouth two times a day as needed for pain  #60 x 6   Entered and Authorized by:   Helane Rima DO   Signed by:   Helane Rima DO on  06/10/2010   Method used:   Electronically to        [facility] (retail)       [address]       [place], [place]  [address]       Ph: [phone]       Fax: [phone]   RxID:   [id number] METFORMIN HCL 1000 MG  TABS (METFORMIN HCL) 1 tablet by mouth bid  #60 x 6   Entered and Authorized by:   Helane Rima DO   Signed by:   Helane Rima DO on 06/10/2010   Method used:   Electronically to        [facility] (retail)       [address]       [place], [place]  [address]       Ph: [phone]       Fax: [phone]   RxID:   [id number]

## 2010-08-01 NOTE — Progress Notes (Signed)
  Phone Note Call from Patient   Caller: Patient Summary of Call: Pt calling regarding refill.  Lisinopril was suppose to be a 90 day supply.  Only received enough for 15 days.  Pt is taking 2 tabs a day. Initial call taken by: Abundio Miu,  June 12, 2010 11:32 AM    New/Updated Medications: LISINOPRIL 40 MG TABS (LISINOPRIL) 1 by mouth once daily Prescriptions: LISINOPRIL 40 MG TABS (LISINOPRIL) 1 by mouth once daily  #90 x 3   Entered and Authorized by:   Helane Rima DO   Signed by:   Helane Rima DO on 06/12/2010   Method used:   Electronically to        [facility] (retail)       [address]       [place], [place]  [address]       Ph: [phone]       Fax: [phone]   RxID:   [id number]

## 2010-08-03 ENCOUNTER — Encounter: Payer: Self-pay | Admitting: *Deleted

## 2010-09-12 ENCOUNTER — Emergency Department (HOSPITAL_COMMUNITY)
Admission: EM | Admit: 2010-09-12 | Discharge: 2010-09-13 | Disposition: A | Discharge status: Home / Self Care | Payer: Self-pay | Attending: Emergency Medicine | Admitting: Emergency Medicine

## 2010-09-12 DIAGNOSIS — I10 Essential (primary) hypertension: Secondary | ICD-10-CM | POA: Insufficient documentation

## 2010-09-12 DIAGNOSIS — R079 Chest pain, unspecified: Secondary | ICD-10-CM | POA: Insufficient documentation

## 2010-09-12 DIAGNOSIS — F17200 Nicotine dependence, unspecified, uncomplicated: Secondary | ICD-10-CM | POA: Insufficient documentation

## 2010-09-12 DIAGNOSIS — E119 Type 2 diabetes mellitus without complications: Secondary | ICD-10-CM | POA: Insufficient documentation

## 2010-09-12 DIAGNOSIS — R071 Chest pain on breathing: Secondary | ICD-10-CM | POA: Insufficient documentation

## 2010-09-13 ENCOUNTER — Encounter (HOSPITAL_COMMUNITY): Payer: Self-pay

## 2010-09-13 ENCOUNTER — Emergency Department (HOSPITAL_COMMUNITY): Payer: Self-pay

## 2010-09-13 LAB — BASIC METABOLIC PANEL
BUN: 21 mg/dL (ref 6–23)
CO2: 21 mEq/L (ref 19–32)
Calcium: 9.1 mg/dL (ref 8.4–10.5)
Creatinine, Ser: 0.87 mg/dL (ref 0.4–1.2)
GFR calc non Af Amer: >60 mL/min (ref >60)
Glucose, Bld: 449 mg/dL — ABNORMAL HIGH (ref 70–99)

## 2010-09-13 LAB — POCT I-STAT, CHEM 8
BUN: 26 mg/dL — ABNORMAL HIGH (ref 6–23)
Calcium, Ion: 1.14 mmol/L (ref 1.12–1.32)
Creatinine, Ser: 0.9 mg/dL (ref 0.4–1.2)
Glucose, Bld: 435 mg/dL — ABNORMAL HIGH (ref 70–99)
Hemoglobin: 14.3 g/dL (ref 12.0–15.0)
Sodium: 134 mEq/L — ABNORMAL LOW (ref 135–145)
TCO2: 19 mmol/L (ref 0–100)

## 2010-09-13 LAB — CBC
Hemoglobin: 13.3 g/dL (ref 12.0–15.0)
MCH: 26.7 pg (ref 26.0–34.0)
MCHC: 33.4 g/dL (ref 30.0–36.0)
MCV: 79.9 fL (ref 78.0–100.0)
RBC: 4.98 MIL/uL (ref 3.87–5.11)

## 2010-09-13 LAB — DIFFERENTIAL
Basophils Relative: 0 % (ref 0–1)
Eosinophils Absolute: 0.1 10*3/uL (ref 0.0–0.7)
Lymphs Abs: 4.5 10*3/uL — ABNORMAL HIGH (ref 0.7–4.0)
Monocytes Absolute: 0.6 10*3/uL (ref 0.1–1.0)
Monocytes Relative: 6 % (ref 3–12)
Neutro Abs: 5.1 10*3/uL (ref 1.7–7.7)
Neutrophils Relative %: 49 % (ref 43–77)

## 2010-09-18 ENCOUNTER — Emergency Department (HOSPITAL_COMMUNITY)
Admission: EM | Admit: 2010-09-18 | Discharge: 2010-09-18 | Disposition: A | Discharge status: Home / Self Care | Payer: Self-pay | Attending: Emergency Medicine | Admitting: Emergency Medicine

## 2010-09-18 ENCOUNTER — Emergency Department (HOSPITAL_COMMUNITY): Payer: Self-pay

## 2010-09-18 DIAGNOSIS — S92919A Unspecified fracture of unspecified toe(s), initial encounter for closed fracture: Secondary | ICD-10-CM | POA: Insufficient documentation

## 2010-09-18 DIAGNOSIS — I10 Essential (primary) hypertension: Secondary | ICD-10-CM | POA: Insufficient documentation

## 2010-09-18 DIAGNOSIS — W2209XA Striking against other stationary object, initial encounter: Secondary | ICD-10-CM | POA: Insufficient documentation

## 2010-09-18 DIAGNOSIS — E119 Type 2 diabetes mellitus without complications: Secondary | ICD-10-CM | POA: Insufficient documentation

## 2010-09-18 DIAGNOSIS — M79609 Pain in unspecified limb: Secondary | ICD-10-CM | POA: Insufficient documentation

## 2010-10-06 LAB — GLUCOSE, CAPILLARY

## 2010-11-15 NOTE — Op Note (Signed)
Kristen Garrett, Kristen Garrett                          ACCOUNT NO.:  [id number]   MEDICAL RECORD NO.:  [id number]                   PATIENT TYPE:  AMB   LOCATION:  SDC                                  [facility]ILITY:  [facility]   PHYSICIAN:  [place] B. Earlene Plater, M.D.               DATE OF BIRTH:  1961/06/27   DATE OF PROCEDURE:  09/08/2003  DATE OF DISCHARGE:                                 OPERATIVE REPORT   PREOPERATIVE DIAGNOSES:  Abnormal bleeding, desires minimally invasive  permanent treatment and tubal sterilization.   POSTOPERATIVE DIAGNOSES:  Abnormal bleeding, desires minimally invasive  permanent treatment and tubal sterilization.   PROCEDURE:  1. Open laparoscopic tubal sterilization.  2. Hysteroscopy D&C.  3. NovaSure endometrial ablation.   SURGEON:  Chester Holstein. Earlene Plater, M.D.   ANESTHESIA:  General.   FINDINGS:  Normal-appearing uterus, tubes, and ovaries with filmy tubal  adhesions bilaterally to the pelvic sidewall.  Normal-appearing upper  abdomen, normal endometrial cavity.  Blood loss less than 50 mL.  Complications none.  Fluid deficit 30 mL of saline.   INDICATION:  A patient with a several year history of heavy menses, abnormal  bleeding.  In June 2004, she underwent a diagnostic hysteroscopy and was  found to have no intracavitary defect and benign endometrial pathology.  Has  been unsuccessfully managed with birth control pills.  She desires a  minimally invasive but permanent treatment for her abnormal bleeding and  tubal sterilization.   The risks of the procedure, including infection, bleeding, uterine  perforation, damage to surrounding organs and fluid overload were discussed.  In addition the failure rate of laparoscopic tubal as well as ectopic risks  were discussed.   DESCRIPTION OF PROCEDURE:  The patient was taken to the operating room and  general anesthesia obtained.  She was prepped and draped in standard fashion  and the bladder emptied with a red rubber  catheter.  Hulka tenaculum  attached to the anterior lip of the cervix.   A 10 mm vertical infraumbilical skin fold incision was made with a knife,  carried sharply to the underlying fascia.  The fascia was divided sharply  with the knife, elevated with Kocher clamps.  The posterior sheath and  peritoneum were elevated and divided sharply.  Pursestring suture of 0  Vicryl placed around the fascial defect.  Hasson cannula inserted and  secured.  Pneumoperitoneum obtained with CO2 gas.   Trendelenburg position was obtained and bowel mobilized superiorly with  blunt probe after being inspected and free of abnormality.  The pelvis and  abdomen were with respect of the above findings noted.   The right tube was identified and followed to its fimbriated end.  It was  then grasped approximately 2 cm distal to the cornu and cauterized in a  triple-burn fashion with bipolar cautery.  Repeated on the opposite side in  a similar fashion.   The scope was removed.  Gas released, and Hasson cannula removed.  Inserted  my index finger through the fascial defect and snugged down the pursestring  suture.  This obliterated the defect and no intra-abdominal contents were  injured prior to closure.  The skin was closed with a subcuticular 4-0  Vicryl.   Attention was turned to the vagina, speculum inserted.  single-tooth  attached to the anterior lip of the cervix after the Hulka was removed.  The  cervix was dilated to #21 without difficulty.  Diagnostic hysteroscope  inserted after being flushed with saline.  With good uterine distention, no  intracavitary defects were noted.   Prior to dilation of the cervix, the uterus was sounded to 8 cm and the  cervical length estimated by Hagar dilator at 4 cm.  Therefore the cavity  length was 4 cm, and this was entered into the NovaSure computer.   The NovaSure device was then inserted to the fundus, drawn back slightly,  already deployed, and placed to the  fundus.  The standard maneuvers were  undertaken to ensure adequate coverage of the cavity.  The cavity width was  then displayed at 4 cm.   The CO2 test was performed and was satisfactory.  The ablation was then  done.  This took 100 seconds at 88 watts power.   The ray was retracted and device removed.  The hysteroscope was reinserted,  and good coverage of the endometrium was noted.   The single-tooth was removed, and the cervix was hemostatic.   The patient tolerated the procedure well, and there were no complications.  She was taken to the recovery room awake, alert, in stable condition.                                               Gerri Spore B. Earlene Plater, M.D.    WBD/MEDQ  D:  09/08/2003  T:  09/09/2003  Job:  [id number]

## 2010-11-15 NOTE — H&P (Signed)
Kristen Garrett, Kristen Garrett                          ACCOUNT NO.:  [id number]   MEDICAL RECORD NO.:  [id number]                   PATIENT TYPE:  AMB   LOCATION:  SDC                                  [facility]  [facility]   PHYSICIAN:  [place] B. Earlene Plater, M.D.               DATE OF BIRTH:  1961-05-22   DATE OF ADMISSION:  DATE OF DISCHARGE:                                HISTORY & PHYSICAL   CHIEF COMPLAINT:  Abnormal bleeding.   INTENDED PROCEDURES:  1. Hysteroscopy.  2. Dilatation and curettage.   HISTORY OF PRESENT ILLNESS:  A 50 year old [place] American female, gravida  3, para 2, A 1, initially seen at the request of Douglass Rivers, M.D., for  evaluation of menorrhagia and dysmenorrhea.  She has not responded to  nonsteroidals and has bleeding for about seven days per month, which is  excessive, making her wear a diaper due to the bleeding.  She had a pelvic  ultrasound which showed an inhomogeneous thickening to the endometrial  stripe.  The patient refused endometrial biopsy or saline sonogram due  concerns regarding.  She presents for additional evaluation under sedation.   PAST MEDICAL HISTORY:  1. Hypercholesterolemia.  2. Diabetes.   PAST OBSTETRICAL HISTORY:  Vaginal delivery x 2.   PAST SURGICAL HISTORY:  D&C for spontaneous abortion.   MEDICATIONS:  1. Aspirin.  2. Glucophage.  3. Ibuprofen.  4. Lipitor.   ALLERGIES:  None.   SOCIAL HISTORY:  One-and-a-half a day smoker.  Occasional alcohol.  No other  drugs.   FAMILY HISTORY:  Noncontributory.   REVIEW OF SYSTEMS:  Otherwise negative.   PHYSICAL EXAMINATION:  VITAL SIGNS:  Blood pressure 130/86, pulse 84.  WEIGHT:  216 pounds.  GENERAL APPEARANCE:  An obese, African American female in no acute distress.  SKIN:  Warm and dry.  No lesions.  HEART:  Regular rate and rhythm.  LUNGS:  Clear to auscultation.  ABDOMEN:  Liver and spleen normal.  No hernia.  PELVIC:  Normal external genitalia, vagina, and cervix.  On  bimanual exam,  the uterus is difficult to palpate due to body habitus, but does not feel  enlarged.  No adnexal masses or tenderness.   ASSESSMENT:  1. Abnormal bleeding.  2. Thickened endometrium on pelvic ultrasound as the only abnormality.   RECOMMENDATIONS:  Recommend additional endometrial evaluation.  The patient  declines any office based procedures, such as endometrial biopsy or saline  sonogram due to concern about pain.  Therefore, presents for hysteroscopy  and D&C.  Operative risks discussed, including infection, bleeding, uterine  perforation, or damage to surrounding organs.  All questions answered.  The  patient wishes to proceed.  Gerri Spore B. Earlene Plater, M.D.    WBD/MEDQ  D:  11/21/2002  T:  11/21/2002  Job:  [id number]   cc:   Douglass Rivers, M.D.  [facility].  Family [facility] Resident  [place], [place] [address]  Fax: [phone]

## 2010-11-15 NOTE — Op Note (Signed)
[name], Kristen Garrett                          ACCOUNT NO.:  [id number]   MEDICAL RECORD NO.:  [id number]                   PATIENT TYPE:  AMB   LOCATION:  SDC                                  [facility]:  [facility]   PHYSICIAN:  [place] B. Earlene Plater, M.D.               DATE OF BIRTH:  02/09/1961   DATE OF PROCEDURE:  11/22/2002  DATE OF DISCHARGE:                                 OPERATIVE REPORT   PREOPERATIVE DIAGNOSIS:  Abnormal uterine bleeding, irregularly thickened  endometrial stripe; the patient declined endometrial biopsy or  sonohysterogram due to concerns about pain.   POSTOPERATIVE DIAGNOSIS:  Abnormal uterine bleeding, irregularly thickened  endometrial stripe; the patient declined endometrial biopsy or  sonohysterogram due to concerns about pain.   PROCEDURE:  Hysteroscopy D&C.   SURGEON:  Chester Holstein. Earlene Plater, M.D.   ANESTHESIA:  LMA general.   FINDINGS:  Shaggy endometrium, no definitive polyp identified, with normal-  appearing tubal ostia.   ESTIMATED BLOOD LOSS:  Less than 50 mL.   FLUID DEFICIT:  40 mL of sorbitol.   COMPLICATIONS:  None.   SPECIMEN:  Endometrial curettings.   INDICATION:  The patient with a history of abnormal uterine bleeding which  has been heavy and irregular.  Office ultrasound showed an irregularly-  thickened endometrial stripe.  The patient was offered endometrial biopsy or  saline sonogram as additional office-based steps for evaluation; however,  she declined these due to concern about pain.  Had had previous bad  experience with endometrial biopsy.   DESCRIPTION OF PROCEDURE:  The patient taken to the operating room and LMA  general anesthesia obtained.  She was placed in the decubitus position and  prepped and draped in the standard fashion.  The bladder was emptied with a  red rubber catheter.  Exam under anesthesia showed normal-size anterior  uterus and no adnexal masses palpable.   Speculum inserted.  Single tooth tenaculum  attached to the anterior lip of  the cervix.  The uterus sounded to 7 cm.  The cervix easily dilated to #21.  The diagnostic hysteroscope was flushed with sorbitol and inserted.  With  good uterine distention a shaggy endometrium was noted and no definitive  polyps were seen; no focal lesions were seen.  The uterus was then gently  curetted and specimens sent to pathology.   The single tooth was removed and the cervix was hemostatic.   The patient tolerated the procedure well and there were no complications.  She was taken to the recovery room awake, alert, in stable condition.                                               Gerri Spore B. Earlene Plater, M.D.    WBD/MEDQ  D:  11/22/2002  T:  11/22/2002  Job:  [id number]

## 2011-04-17 LAB — POCT URINALYSIS DIP (DEVICE)
Bilirubin Urine: NEGATIVE
Bilirubin Urine: NEGATIVE
Glucose, UA: NEGATIVE
Hgb urine dipstick: NEGATIVE
Ketones, ur: NEGATIVE
Ketones, ur: NEGATIVE
Nitrite: NEGATIVE
Specific Gravity, Urine: 1.015
Specific Gravity, Urine: 1.015
pH: 5.5
pH: 5.5

## 2011-05-20 ENCOUNTER — Emergency Department (HOSPITAL_COMMUNITY)
Admission: EM | Admit: 2011-05-20 | Discharge: 2011-05-20 | Disposition: A | Discharge status: Home / Self Care | Payer: Self-pay

## 2011-06-26 ENCOUNTER — Other Ambulatory Visit: Payer: Self-pay | Admitting: Family Medicine

## 2011-06-26 NOTE — Telephone Encounter (Signed)
Refill request

## 2011-07-02 ENCOUNTER — Telehealth: Payer: Self-pay | Admitting: Family Medicine

## 2011-07-02 ENCOUNTER — Other Ambulatory Visit: Payer: Self-pay | Admitting: Family Medicine

## 2011-07-02 MED ORDER — LISINOPRIL 40 MG PO TABS: 40.0000 mg | ORAL_TABLET | Freq: Every day | ORAL | Status: DC

## 2011-07-02 NOTE — Telephone Encounter (Signed)
Will ask administrative staff to please call pt and let her know that I did send in a refill for lisinopril for 30 days to get her through until she can follow up here at the office.  We need to see her here in the office and perform routine blood work before giving further prescription refills.  Please call pt to schedule follow up appointment.  Thanks, [employer]

## 2011-07-05 ENCOUNTER — Other Ambulatory Visit: Payer: Self-pay | Admitting: Family Medicine

## 2011-07-05 NOTE — Telephone Encounter (Signed)
Refill request

## 2011-09-30 ENCOUNTER — Other Ambulatory Visit: Payer: Self-pay | Admitting: Family Medicine

## 2011-10-10 ENCOUNTER — Encounter: Payer: Self-pay | Admitting: Family Medicine

## 2011-10-10 ENCOUNTER — Ambulatory Visit (INDEPENDENT_AMBULATORY_CARE_PROVIDER_SITE_OTHER): Payer: Self-pay | Admitting: Family Medicine

## 2011-10-10 VITALS — BP 154/92 | HR 78 | Ht 66.5 in | Wt 180.0 lb

## 2011-10-10 DIAGNOSIS — E049 Nontoxic goiter, unspecified: Secondary | ICD-10-CM

## 2011-10-10 DIAGNOSIS — I10 Essential (primary) hypertension: Secondary | ICD-10-CM

## 2011-10-10 DIAGNOSIS — E7800 Pure hypercholesterolemia, unspecified: Secondary | ICD-10-CM

## 2011-10-10 DIAGNOSIS — Z0000 Encounter for general adult medical examination without abnormal findings: Secondary | ICD-10-CM

## 2011-10-10 DIAGNOSIS — M25512 Pain in left shoulder: Secondary | ICD-10-CM

## 2011-10-10 DIAGNOSIS — F17200 Nicotine dependence, unspecified, uncomplicated: Secondary | ICD-10-CM

## 2011-10-10 DIAGNOSIS — M25519 Pain in unspecified shoulder: Secondary | ICD-10-CM

## 2011-10-10 DIAGNOSIS — E119 Type 2 diabetes mellitus without complications: Secondary | ICD-10-CM

## 2011-10-10 LAB — COMPREHENSIVE METABOLIC PANEL
ALT: 12 U/L (ref 0–35)
AST: 11 U/L (ref 0–37)
Albumin: 4.1 g/dL (ref 3.5–5.2)
BUN: 12 mg/dL (ref 6–23)
CO2: 24 mEq/L (ref 19–32)
Calcium: 9.7 mg/dL (ref 8.4–10.5)
Chloride: 104 mEq/L (ref 96–112)
Potassium: 4.7 mEq/L (ref 3.5–5.3)

## 2011-10-10 LAB — LIPID PANEL
LDL Cholesterol: 133 mg/dL — ABNORMAL HIGH (ref 0–99)
Triglycerides: 189 mg/dL — ABNORMAL HIGH (ref <150)

## 2011-10-10 LAB — TSH: TSH: 0.74 u[IU]/mL (ref 0.350–4.500)

## 2011-10-10 MED ORDER — IBUPROFEN 800 MG PO TABS: 800.0000 mg | ORAL_TABLET | Freq: Three times a day (TID) | ORAL | Status: DC | PRN

## 2011-10-10 MED ORDER — METFORMIN HCL 1000 MG PO TABS: 1000.0000 mg | ORAL_TABLET | Freq: Two times a day (BID) | ORAL | Status: DC

## 2011-10-10 MED ORDER — LISINOPRIL 20 MG PO TABS: 40.0000 mg | ORAL_TABLET | Freq: Every day | ORAL | Status: DC

## 2011-10-10 NOTE — Progress Notes (Signed)
  Subjective:    Patient ID: Kristen Garrett, female    DOB: [birth year], 51 y.o.   MRN: [id number]  HPI Cholesterol- Not taking medication.  Didn't like pravastatin because caused aching.   DM- Not taking metformin.  Not checking BG at home. Not up to date on eye and dental appointments.    Thyroid- Has noticed thyroid enlargment 8 years ago.  Thyroid was checked and everything was normal.  No fatigue.  No hairloss.  No constipation. No tremors.     Left should pain- Pain in left shoulder, can't lift, problems with sleeping, numbness occasionally in arm.   x1 year.  Off and on.  Worse with laying on shoulder.  Pain with overuse- carrying things.  Not moving makes pain better.  Motrin makes this better.  No joint swelling.  No joint redness.  No fever.   Blood pressure- Elevated 154/92, out of blood pressure medicine.  Runs 120/80's at home per patient.    Review of Systems As per above    Objective:   Physical Exam  Constitutional: She appears well-developed and well-nourished.  HENT:  Head: Normocephalic and atraumatic.  Eyes: Pupils are equal, round, and reactive to light.  Neck: Normal range of motion. Thyromegaly (no nodules of thyroid palpated) present.  Cardiovascular: Normal rate, regular rhythm and normal heart sounds.   No murmur heard. Pulmonary/Chest: Effort normal. No respiratory distress. She has no wheezes. She has no rales.  Abdominal: Soft. She exhibits no distension.  Musculoskeletal: She exhibits no edema.       Left shoulder exam: Unable to abduct arm above shoulder height.  Pain when raising above level of head.  Pain with internal rotation of left shoulder.  + empty can sign.  No problems with sensation.  Grip strength equal bilateral.   See diabetic foot exam.   Lymphadenopathy:    She has no cervical adenopathy.  Neurological: She is alert.  Skin: No rash noted.  Psychiatric: She has a normal mood and affect.          Assessment & Plan:

## 2011-10-10 NOTE — Patient Instructions (Signed)
Left shoulder pain: I feel is due to a rotator cuff injury.   Lab work: I will mail it to you.  Or if I need to adjust medication I will call.  HTN: Refill is sent in.  Continue to monitor.  BP goal is to be less than 130/80.  Cigarettes: Dr. Raymondo Band- consider meeting with him for smoking cessation call.   Diabetes: Will check A1C.  Go to [employer] and buy the cheapest.    Return once you get the orange card.  For complete physical and followup.     Impingement Syndrome, Rotator Cuff, Bursitis with Rehab Impingement syndrome is a condition that involves inflammation of the tendons of the rotator cuff and the subacromial bursa, that causes pain in the shoulder. The rotator cuff consists of four tendons and muscles that control much of the shoulder and upper arm function. The subacromial bursa is a fluid filled sac that helps reduce friction between the rotator cuff and one of the bones of the shoulder (acromion). Impingement syndrome is usually an overuse injury that causes swelling of the bursa (bursitis), swelling of the tendon (tendonitis), and/or a tear of the tendon (strain). Strains are classified into three categories. Grade 1 strains cause pain, but the tendon is not lengthened. Grade 2 strains include a lengthened ligament, due to the ligament being stretched or partially ruptured. With grade 2 strains there is still function, although the function may be decreased. Grade 3 strains include a complete tear of the tendon or muscle, and function is usually impaired. SYMPTOMS   Pain around the shoulder, often at the outer portion of the upper arm.   Pain that gets worse with shoulder function, especially when reaching overhead or lifting.   Sometimes, aching when not using the arm.   Pain that wakes you up at night.   Sometimes, tenderness, swelling, warmth, or redness over the affected area.   Loss of strength.   Limited motion of the shoulder, especially reaching behind the back  (to the back pocket or to unhook bra) or across your body.   Crackling sound (crepitation) when moving the arm.   Biceps tendon pain and inflammation (in the front of the shoulder). Worse when bending the elbow or lifting.  CAUSES  Impingement syndrome is often an overuse injury, in which chronic (repetitive) motions cause the tendons or bursa to become inflamed. A strain occurs when a force is paced on the tendon or muscle that is greater than it can withstand. Common mechanisms of injury include: Stress from sudden increase in duration, frequency, or intensity of training.  Direct hit (trauma) to the shoulder.   Aging, erosion of the tendon with normal use.   Bony bump on shoulder (acromial spur).  RISK INCREASES WITH:  Contact sports (football, wrestling, boxing).   Throwing sports (baseball, tennis, volleyball).   Weightlifting and bodybuilding.   Heavy labor.   Previous injury to the rotator cuff, including impingement.   Poor shoulder strength and flexibility.   Failure to warm up properly before activity.   Inadequate protective equipment.   Old age.   Bony bump on shoulder (acromial spur).  PREVENTION   Warm up and stretch properly before activity.   Allow for adequate recovery between workouts.   Maintain physical fitness:   Strength, flexibility, and endurance.   Cardiovascular fitness.   Learn and use proper exercise technique.  PROGNOSIS  If treated properly, impingement syndrome usually goes away within 6 weeks. Sometimes surgery is required.  RELATED COMPLICATIONS  Longer healing time if not properly treated, or if not given enough time to heal.   Recurring symptoms, that result in a chronic condition.   Shoulder stiffness, frozen shoulder, or loss of motion.   Rotator cuff tendon tear.   Recurring symptoms, especially if activity is resumed too soon, with overuse, with a direct blow, or when using poor technique.  TREATMENT  Treatment  first involves the use of ice and medicine, to reduce pain and inflammation. The use of strengthening and stretching exercises may help reduce pain with activity. These exercises may be performed at home or with a therapist. If non-surgical treatment is unsuccessful after more than 6 months, surgery may be advised. After surgery and rehabilitation, activity is usually possible in 3 months.  MEDICATION  If pain medicine is needed, nonsteroidal anti-inflammatory medicines (aspirin and ibuprofen), or other minor pain relievers (acetaminophen), are often advised.   Do not take pain medicine for 7 days before surgery.   Prescription pain relievers may be given, if your caregiver thinks they are needed. Use only as directed and only as much as you need.   Corticosteroid injections may be given by your caregiver. These injections should be reserved for the most serious cases, because they may only be given a certain number of times.  HEAT AND COLD  Cold treatment (icing) should be applied for 10 to 15 minutes every 2 to 3 hours for inflammation and pain, and immediately after activity that aggravates your symptoms. Use ice packs or an ice massage.   Heat treatment may be used before performing stretching and strengthening activities prescribed by your caregiver, physical therapist, or athletic trainer. Use a heat pack or a warm water soak.  SEEK MEDICAL CARE IF:   Symptoms get worse or do not improve in 4 to 6 weeks, despite treatment.   New, unexplained symptoms develop. (Drugs used in treatment may produce side effects.)  EXERCISES  RANGE OF MOTION (ROM) AND STRETCHING EXERCISES - Impingement Syndrome (Rotator Cuff  Tendinitis, Bursitis) These exercises may help you when beginning to rehabilitate your injury. Your symptoms may go away with or without further involvement from your physician, physical therapist or athletic trainer. While completing these exercises, remember:   Restoring tissue  flexibility helps normal motion to return to the joints. This allows healthier, less painful movement and activity.   An effective stretch should be held for at least 30 seconds.   A stretch should never be painful. You should only feel a gentle lengthening or release in the stretched tissue.  STRETCH - Flexion, Standing  Stand with good posture. With an underhand grip on your right / left hand, and an overhand grip on the opposite hand, grasp a broomstick or cane so that your hands are a little more than shoulder width apart.   Keeping your right / left elbow straight and shoulder muscles relaxed, push the stick with your opposite hand, to raise your right / left arm in front of your body and then overhead. Raise your arm until you feel a stretch in your right / left shoulder, but before you have increased shoulder pain.   Try to avoid shrugging your right / left shoulder as your arm rises, by keeping your shoulder blade tucked down and toward your mid-back spine. Hold for __________ seconds.   Slowly return to the starting position.  Repeat __________ times. Complete this exercise __________ times per day. STRETCH - Abduction, Supine  Lie on your back. With an underhand grip  on your right / left hand and an overhand grip on the opposite hand, grasp a broomstick or cane so that your hands are a little more than shoulder width apart.   Keeping your right / left elbow straight and your shoulder muscles relaxed, push the stick with your opposite hand, to raise your right / left arm out to the side of your body and then overhead. Raise your arm until you feel a stretch in your right / left shoulder, but before you have increased shoulder pain.   Try to avoid shrugging your right / left shoulder as your arm rises, by keeping your shoulder blade tucked down and toward your mid-back spine. Hold for __________ seconds.   Slowly return to the starting position.  Repeat __________ times. Complete this  exercise __________ times per day. ROM - Flexion, Active-Assisted  Lie on your back. You may bend your knees for comfort.   Grasp a broomstick or cane so your hands are about shoulder width apart. Your right / left hand should grip the end of the stick, so that your hand is positioned "thumbs-up," as if you were about to shake hands.   Using your healthy arm to lead, raise your right / left arm overhead, until you feel a gentle stretch in your shoulder. Hold for __________ seconds.   Use the stick to assist in returning your right / left arm to its starting position.  Repeat __________ times. Complete this exercise __________ times per day.  ROM - Internal Rotation, Supine   Lie on your back on a firm surface. Place your right / left elbow about 60 degrees away from your side. Elevate your elbow with a folded towel, so that the elbow and shoulder are the same height.   Using a broomstick or cane and your strong arm, pull your right / left hand toward your body until you feel a gentle stretch, but no increase in your shoulder pain. Keep your shoulder and elbow in place throughout the exercise.   Hold for __________ seconds. Slowly return to the starting position.  Repeat __________ times. Complete this exercise __________ times per day. STRETCH - Internal Rotation  Place your right / left hand behind your back, palm up.   Throw a towel or belt over your opposite shoulder. Grasp the towel with your right / left hand.   While keeping an upright posture, gently pull up on the towel, until you feel a stretch in the front of your right / left shoulder.   Avoid shrugging your right / left shoulder as your arm rises, by keeping your shoulder blade tucked down and toward your mid-back spine.   Hold for __________ seconds. Release the stretch, by lowering your healthy hand.  Repeat __________ times. Complete this exercise __________ times per day. ROM - Internal Rotation   Using an underhand  grip, grasp a stick behind your back with both hands.   While standing upright with good posture, slide the stick up your back until you feel a mild stretch in the front of your shoulder.   Hold for __________ seconds. Slowly return to your starting position.  Repeat __________ times. Complete this exercise __________ times per day.  STRETCH - Posterior Shoulder Capsule   Stand or sit with good posture. Grasp your right / left elbow and draw it across your chest, keeping it at the same height as your shoulder.   Pull your elbow, so your upper arm comes in closer to your chest.  Pull until you feel a gentle stretch in the back of your shoulder.   Hold for __________ seconds.  Repeat __________ times. Complete this exercise __________ times per day. STRENGTHENING EXERCISES - Impingement Syndrome (Rotator Cuff Tendinitis, Bursitis) These exercises may help you when beginning to rehabilitate your injury. They may resolve your symptoms with or without further involvement from your physician, physical therapist or athletic trainer. While completing these exercises, remember:  Muscles can gain both the endurance and the strength needed for everyday activities through controlled exercises.   Complete these exercises as instructed by your physician, physical therapist or athletic trainer. Increase the resistance and repetitions only as guided.   You may experience muscle soreness or fatigue, but the pain or discomfort you are trying to eliminate should never worsen during these exercises. If this pain does get worse, stop and make sure you are following the directions exactly. If the pain is still present after adjustments, discontinue the exercise until you can discuss the trouble with your clinician.   During your recovery, avoid activity or exercises which involve actions that place your injured hand or elbow above your head or behind your back or head. These positions stress the tissues which you  are trying to heal.  STRENGTH - Scapular Depression and Adduction   With good posture, sit on a firm chair. Support your arms in front of you, with pillows, arm rests, or on a table top. Have your elbows in line with the sides of your body.   Gently draw your shoulder blades down and toward your mid-back spine. Gradually increase the tension, without tensing the muscles along the top of your shoulders and the back of your neck.   Hold for __________ seconds. Slowly release the tension and relax your muscles completely before starting the next repetition.   After you have practiced this exercise, remove the arm support and complete the exercise in standing as well as sitting position.  Repeat __________ times. Complete this exercise __________ times per day.  STRENGTH - Shoulder Abductors, Isometric  With good posture, stand or sit about 4-6 inches from a wall, with your right / left side facing the wall.   Bend your right / left elbow. Gently press your right / left elbow into the wall. Increase the pressure gradually, until you are pressing as hard as you can, without shrugging your shoulder or increasing any shoulder discomfort.   Hold for __________ seconds.   Release the tension slowly. Relax your shoulder muscles completely before you begin the next repetition.  Repeat __________ times. Complete this exercise __________ times per day.  STRENGTH - External Rotators, Isometric  Keep your right / left elbow at your side and bend it 90 degrees.   Step into a door frame so that the outside of your right / left wrist can press against the door frame without your upper arm leaving your side.   Gently press your right / left wrist into the door frame, as if you were trying to swing the back of your hand away from your stomach. Gradually increase the tension, until you are pressing as hard as you can, without shrugging your shoulder or increasing any shoulder discomfort.   Hold for  __________ seconds.   Release the tension slowly. Relax your shoulder muscles completely before you begin the next repetition.  Repeat __________ times. Complete this exercise __________ times per day.  STRENGTH - Supraspinatus   Stand or sit with good posture. Grasp a __________ weight,  or an exercise band or tubing, so that your hand is "thumbs-up," like you are shaking hands.   Slowly lift your right / left arm in a "V" away from your thigh, diagonally into the space between your side and straight ahead. Lift your hand to shoulder height or as far as you can, without increasing any shoulder pain. At first, many people do not lift their hands above shoulder height.   Avoid shrugging your right / left shoulder as your arm rises, by keeping your shoulder blade tucked down and toward your mid-back spine.   Hold for __________ seconds. Control the descent of your hand, as you slowly return to your starting position.  Repeat __________ times. Complete this exercise __________ times per day.  STRENGTH - External Rotators  Secure a rubber exercise band or tubing to a fixed object (table, pole) so that it is at the same height as your right / left elbow when you are standing or sitting on a firm surface.   Stand or sit so that the secured exercise band is at your uninjured side.   Bend your right / left elbow 90 degrees. Place a folded towel or small pillow under your right / left arm, so that your elbow is a few inches away from your side.   Keeping the tension on the exercise band, pull it away from your body, as if pivoting on your elbow. Be sure to keep your body steady, so that the movement is coming only from your rotating shoulder.   Hold for __________ seconds. Release the tension in a controlled manner, as you return to the starting position.  Repeat __________ times. Complete this exercise __________ times per day.  STRENGTH - Internal Rotators   Secure a rubber exercise band or  tubing to a fixed object (table, pole) so that it is at the same height as your right / left elbow when you are standing or sitting on a firm surface.   Stand or sit so that the secured exercise band is at your right / left side.   Bend your elbow 90 degrees. Place a folded towel or small pillow under your right / left arm so that your elbow is a few inches away from your side.   Keeping the tension on the exercise band, pull it across your body, toward your stomach. Be sure to keep your body steady, so that the movement is coming only from your rotating shoulder.   Hold for __________ seconds. Release the tension in a controlled manner, as you return to the starting position.  Repeat __________ times. Complete this exercise __________ times per day.  STRENGTH - Scapular Protractors, Standing   Stand arms length away from a wall. Place your hands on the wall, keeping your elbows straight.   Begin by dropping your shoulder blades down and toward your mid-back spine.   To strengthen your protractors, keep your shoulder blades down, but slide them forward on your rib cage. It will feel as if you are lifting the back of your rib cage away from the wall. This is a subtle motion and can be challenging to complete. Ask your caregiver for further instruction, if you are not sure you are doing the exercise correctly.   Hold for __________ seconds. Slowly return to the starting position, resting the muscles completely before starting the next repetition.  Repeat __________ times. Complete this exercise __________ times per day. STRENGTH - Scapular Protractors, Supine  Lie on your back on  a firm surface. Extend your right / left arm straight into the air while holding a __________ weight in your hand.   Keeping your head and back in place, lift your shoulder off the floor.   Hold for __________ seconds. Slowly return to the starting position, and allow your muscles to relax completely before starting  the next repetition.  Repeat __________ times. Complete this exercise __________ times per day. STRENGTH - Scapular Protractors, Quadruped  Get onto your hands and knees, with your shoulders directly over your hands (or as close as you can be, comfortably).   Keeping your elbows locked, lift the back of your rib cage up into your shoulder blades, so your mid-back rounds out. Keep your neck muscles relaxed.   Hold this position for __________ seconds. Slowly return to the starting position and allow your muscles to relax completely before starting the next repetition.  Repeat __________ times. Complete this exercise __________ times per day.  STRENGTH - Scapular Retractors  Secure a rubber exercise band or tubing to a fixed object (table, pole), so that it is at the height of your shoulders when you are either standing, or sitting on a firm armless chair.   With a palm down grip, grasp an end of the band in each hand. Straighten your elbows and lift your hands straight in front of you, at shoulder height. Step back, away from the secured end of the band, until it becomes tense.   Squeezing your shoulder blades together, draw your elbows back toward your sides, as you bend them. Keep your upper arms lifted away from your body throughout the exercise.   Hold for __________ seconds. Slowly ease the tension on the band, as you reverse the directions and return to the starting position.  Repeat __________ times. Complete this exercise __________ times per day. STRENGTH - Shoulder Extensors   Secure a rubber exercise band or tubing to a fixed object (table, pole) so that it is at the height of your shoulders when you are either standing, or sitting on a firm armless chair.   With a thumbs-up grip, grasp an end of the band in each hand. Straighten your elbows and lift your hands straight in front of you, at shoulder height. Step back, away from the secured end of the band, until it becomes tense.    Squeezing your shoulder blades together, pull your hands down to the sides of your thighs. Do not allow your hands to go behind you.   Hold for __________ seconds. Slowly ease the tension on the band, as you reverse the directions and return to the starting position.  Repeat __________ times. Complete this exercise __________ times per day.  STRENGTH - Scapular Retractors and External Rotators   Secure a rubber exercise band or tubing to a fixed object (table, pole) so that it is at the height as your shoulders, when you are either standing, or sitting on a firm armless chair.   With a palm down grip, grasp an end of the band in each hand. Bend your elbows 90 degrees and lift your elbows to shoulder height, at your sides. Step back, away from the secured end of the band, until it becomes tense.   Squeezing your shoulder blades together, rotate your shoulders so that your upper arms and elbows remain stationary, but your fists travel upward to head height.   Hold for __________ seconds. Slowly ease the tension on the band, as you reverse the directions and return to  the starting position.  Repeat __________ times. Complete this exercise __________ times per day.  STRENGTH - Scapular Retractors and External Rotators, Rowing   Secure a rubber exercise band or tubing to a fixed object (table, pole) so that it is at the height of your shoulders, when you are either standing, or sitting on a firm armless chair.   With a palm down grip, grasp an end of the band in each hand. Straighten your elbows and lift your hands straight in front of you, at shoulder height. Step back, away from the secured end of the band, until it becomes tense.   Step 1: Squeeze your shoulder blades together. Bending your elbows, draw your hands to your chest, as if you are rowing a boat. At the end of this motion, your hands and elbow should be at shoulder height and your elbows should be out to your sides.   Step 2:  Rotate your shoulders, to raise your hands above your head. Your forearms should be vertical and your upper arms should be horizontal.   Hold for __________ seconds. Slowly ease the tension on the band, as you reverse the directions and return to the starting position.  Repeat __________ times. Complete this exercise __________ times per day.  STRENGTH - Scapular Depressors  Find a sturdy chair without wheels, such as a dining room chair.   Keeping your feet on the floor, and your hands on the chair arms, lift your bottom up from the seat, and lock your elbows.   Keeping your elbows straight, allow gravity to pull your body weight down. Your shoulders will rise toward your ears.   Raise your body against gravity by drawing your shoulder blades down your back, shortening the distance between your shoulders and ears. Although your feet should always maintain contact with the floor, your feet should progressively support less body weight, as you get stronger.   Hold for __________ seconds. In a controlled and slow manner, lower your body weight to begin the next repetition.  Repeat __________ times. Complete this exercise __________ times per day.  Document Released: 06/16/2005 Document Revised: 06/05/2011 Document Reviewed: 09/28/2008 [facility] Patient Information 2012 [place], [place].Rotator Cuff Injury The rotator cuff is the collective set of muscles and tendons that make up the stabilizing unit of your shoulder. This unit holds in the ball of the humerus (upper arm bone) in the socket of the scapula (shoulder blade). Injuries to this stabilizing unit most commonly come from sports or activities that cause the arm to be moved repeatedly over the head. Examples of this include throwing, weight lifting, swimming, racquet sports, or an injury such as falling on your arm. Chronic (longstanding) irritation of this unit can cause inflammation (soreness), bursitis, and eventual damage to the tendons to  the point of rupture (tear). An acute (sudden) injury of the rotator cuff can result in a partial or complete tear. You may need surgery with complete tears. Small or partial rotator cuff tears may be treated conservatively with temporary immobilization, exercises and rest. Physical therapy may be needed. HOME CARE INSTRUCTIONS   Apply ice to the injury for 15 to 20 minutes 3 to 4 times per day for the first 2 days. Put the ice in a plastic bag and place a towel between the bag of ice and your skin.   If you have a shoulder immobilizer (sling and straps), do not remove it for as long as directed by your caregiver or until you see a caregiver  for a follow-up examination. If you need to remove it, move your arm as little as possible.   You may want to sleep on several pillows or in a recliner at night to lessen swelling and pain.   Only take over-the-counter or prescription medicines for pain, discomfort, or fever as directed by your caregiver.   Do simple hand squeezing exercises with a soft rubber ball to decrease hand swelling.  SEEK MEDICAL CARE IF:   Pain in your shoulder increases or new pain or numbness develops in your arm, hand, or fingers.   Your hand or fingers are colder than your other hand.  SEEK IMMEDIATE MEDICAL CARE IF:   Your arm, hand, or fingers are numb or tingling.   Your arm, hand, or fingers are increasingly swollen and painful, or turn white or blue.  Document Released: 06/13/2000 Document Revised: 06/05/2011 Document Reviewed: 06/06/2008 [facility] Patient Information 2012 [place], [place].Left shoulder:

## 2011-10-11 ENCOUNTER — Encounter: Payer: Self-pay | Admitting: Family Medicine

## 2011-10-11 DIAGNOSIS — Z0000 Encounter for general adult medical examination without abnormal findings: Secondary | ICD-10-CM | POA: Insufficient documentation

## 2011-10-11 DIAGNOSIS — M25512 Pain in left shoulder: Secondary | ICD-10-CM | POA: Insufficient documentation

## 2011-10-11 DIAGNOSIS — E049 Nontoxic goiter, unspecified: Secondary | ICD-10-CM | POA: Insufficient documentation

## 2011-10-11 NOTE — Assessment & Plan Note (Signed)
Symptoms consistent with rotator cuff injury.  Home exercises given.  Motrin prn can be used for pain relief.  If no improvement with home exercises will refer for formal physical therapy.

## 2011-10-11 NOTE — Assessment & Plan Note (Signed)
A chronic issue- no nodules palpated.  Will recheck TSH.

## 2011-10-11 NOTE — Assessment & Plan Note (Signed)
Elevated at 154/92- recommended pt restart bp medication.  Recommended daily exercise.

## 2011-10-11 NOTE — Assessment & Plan Note (Signed)
Lipid panel ordered today.  Pt to return in the next 2-3 weeks and we will review results and decide on treatment plan.

## 2011-10-11 NOTE — Assessment & Plan Note (Addendum)
Pt a1c checked at the end of todays visit- >14.  Pt to go purchase glucometer and track blood glucose at home.  Pt sent a letter with results and the need to f/up asap.  Pt is in the process of applying for assistance through [facility].  Pt is to restart metformin. Discussed the importance of starting a regular exercise plan.  Also discussed diet- plate method.  See pt instructions.

## 2011-10-11 NOTE — Assessment & Plan Note (Signed)
Encouraged smoking cessation.  Recommended that pt attend Dr. Macky Lower smoking cessation classes.

## 2011-10-11 NOTE — Assessment & Plan Note (Signed)
At future appt need to review recommended screenings and get pt uptdate on pap smear.

## 2012-05-06 ENCOUNTER — Inpatient Hospital Stay (HOSPITAL_COMMUNITY)
Admission: AD | Admit: 2012-05-06 | Discharge: 2012-05-06 | Disposition: A | Discharge status: Home / Self Care | Payer: Self-pay | Source: Ambulatory Visit | Attending: Obstetrics & Gynecology | Admitting: Obstetrics & Gynecology

## 2012-05-06 DIAGNOSIS — N644 Mastodynia: Secondary | ICD-10-CM | POA: Insufficient documentation

## 2012-05-06 DIAGNOSIS — M67919 Unspecified disorder of synovium and tendon, unspecified shoulder: Secondary | ICD-10-CM

## 2012-05-06 DIAGNOSIS — M25519 Pain in unspecified shoulder: Secondary | ICD-10-CM | POA: Insufficient documentation

## 2012-05-06 MED ORDER — HYDROCODONE-ACETAMINOPHEN 5-325 MG PO TABS: 1.0000 | ORAL_TABLET | ORAL | Status: DC | PRN

## 2012-05-06 NOTE — MAU Note (Signed)
Pt reports having sharp/hot pain in her left breast. Has been going on x 3 weeks and not getting any better

## 2012-05-06 NOTE — MAU Note (Addendum)
Pt reports intermittent L breast pain; "feels like shooting hot darts" running from top of breast down toward nipple. Reports no lumps felt on palpation, no nipple discharge.  Last mammogram was 2 years ago. Pt reports she wore a bra which was too small for about 3 weeks and that is when she believes pain started.

## 2012-05-06 NOTE — MAU Provider Note (Signed)
Attestation of Attending Supervision of Advanced Practitioner (CNM/NP): Evaluation and management procedures were performed by the Advanced Practitioner under my supervision and collaboration.  I have reviewed the Advanced Practitioner's note and chart, and I agree with the management and plan.  HARRAWAY-SMITH, Adalynd 4:14 PM     

## 2012-05-06 NOTE — MAU Provider Note (Signed)
History     CSN: [id number]  Arrival date and time: 05/06/12 1204   First Provider Initiated Contact with Patient 05/06/12 1310      Chief Complaint  Patient presents with  . Breast Pain   HPI Kristen Garrett is a 51 y.o. female who presents to MAU with breast pain. The pain started 3 weeks ago.   The pain is located in the left breast.  She describes the pains as sharp shooting that comes and goes. She rates the pain as 8/10. She denies having felt any mass an no discharge from nipple. The history was provided by the patient.  OB History    Grav Para Term Preterm Abortions TAB SAB Ect Mult Living                  Past Medical History  Diagnosis Date  . Diabetes mellitus   . Hyperlipidemia     Past Surgical History  Procedure Date  . Tubal ligation   . Endometrial ablation w/ novasure     Family History  Problem Relation Age of Onset  . Diabetes Mother   . Hypertension Mother   . Hyperlipidemia Mother   . Diabetes Father   . Hyperlipidemia Father     History  Substance Use Topics  . Smoking status: Current Every Day Smoker -- 0.5 packs/day  . Smokeless tobacco: Not on file  . Alcohol Use: No    Allergies: No Known Allergies  Prescriptions prior to admission  Medication Sig Dispense Refill  . aspirin 81 MG chewable tablet Chew 81 mg by mouth daily.        Marland Kitchen ibuprofen (ADVIL,MOTRIN) 800 MG tablet Take 1 tablet (800 mg total) by mouth every 8 (eight) hours as needed for pain.  60 tablet  0  . lisinopril (PRINIVIL,ZESTRIL) 20 MG tablet Take 2 tablets (40 mg total) by mouth daily.  180 tablet  11  . metFORMIN (GLUCOPHAGE) 1000 MG tablet Take 1 tablet (1,000 mg total) by mouth 2 (two) times daily with a meal.  60 tablet  1  . pravastatin (PRAVACHOL) 40 MG tablet Take 40 mg by mouth daily.          Review of Systems  Constitutional: Negative for fever, chills and weight loss.  HENT: Negative for ear pain, nosebleeds, congestion, sore throat and neck pain.     Eyes: Negative for blurred vision, double vision, photophobia and pain.  Respiratory: Negative for cough, shortness of breath and wheezing.   Cardiovascular: Negative for chest pain, palpitations and leg swelling.       Left breast pain  Gastrointestinal: Negative for heartburn, nausea, vomiting, abdominal pain, diarrhea and constipation.  Genitourinary: Negative for dysuria, urgency and frequency.  Musculoskeletal: Positive for back pain. Negative for myalgias.       Rotator cuff injury   Skin: Negative for itching and rash.  Neurological: Negative for dizziness, sensory change, speech change, seizures, weakness and headaches.  Endo/Heme/Allergies: Does not bruise/bleed easily.  Psychiatric/Behavioral: Negative for depression. The patient is not nervous/anxious.    Physical Exam   Blood pressure 131/69, pulse 84, temperature 98.3 F (36.8 C), temperature source Oral, resp. rate 18, height 5' 6.5" (1.689 m), weight 174 lb 3.2 oz (79.017 kg).  Physical Exam  Nursing note and vitals reviewed. Constitutional: She is oriented to person, place, and time. She appears well-developed and well-nourished. No distress.  HENT:  Head: Normocephalic and atraumatic.  Eyes: EOM are normal.  Neck: Neck supple.  Cardiovascular:  Normal rate.   Respiratory: Effort normal. Left breast exhibits no inverted nipple, no mass, no nipple discharge, no skin change and no tenderness.       Unable to reproduce the shooting pain the patient describes on exam.  GI: Soft. There is no tenderness.  Musculoskeletal:       The patient has difficulty raising her left arm over her head due to pain over the rotator cuff.  Neurological: She is alert and oriented to person, place, and time.  Skin: Skin is warm and dry.  Psychiatric: She has a normal mood and affect. Her behavior is normal. Judgment and thought content normal.   Assessment: 51 y.o. female with left breast pain   Rotator cuff pain  Plan:  Schedule for  mammogram and evaluation at [facility], I spoke with them and they   will call the patient.   Pain management.  I discussed with the patient that if is difficult to determine if the shooting pain is related to the rotator cuff injury or if it is from the breast itself. However, since it has been 2 years since last mammogram will start there and then she can follow up with her PCP. Patient voices understanding.      Medication List     As of 05/06/2012  1:43 PM    START taking these medications         HYDROcodone-acetaminophen 5-325 MG per tablet   Commonly known as: NORCO/VICODIN   Take 1 tablet by mouth every 4 (four) hours as needed for pain.      CONTINUE taking these medications         aspirin 81 MG chewable tablet      ibuprofen 800 MG tablet   Commonly known as: ADVIL,MOTRIN   Take 1 tablet (800 mg total) by mouth every 8 (eight) hours as needed for pain.      lisinopril 20 MG tablet   Commonly known as: PRINIVIL,ZESTRIL   Take 2 tablets (40 mg total) by mouth daily.      metFORMIN 1000 MG tablet   Commonly known as: GLUCOPHAGE   Take 1 tablet (1,000 mg total) by mouth 2 (two) times daily with a meal.      pravastatin 40 MG tablet   Commonly known as: PRAVACHOL          Where to get your medications    These are the prescriptions that you need to pick up.   You may get these medications from any pharmacy.         HYDROcodone-acetaminophen 5-325 MG per tablet           MAU Course  Procedures   Bryanna Yim, RN, FNP, [facility] 05/06/2012, 1:10 PM

## 2012-05-07 ENCOUNTER — Other Ambulatory Visit: Payer: Self-pay | Admitting: Nurse Practitioner

## 2012-05-07 DIAGNOSIS — N644 Mastodynia: Secondary | ICD-10-CM

## 2012-05-10 ENCOUNTER — Encounter: Payer: Self-pay | Admitting: Home Health Services

## 2012-05-12 ENCOUNTER — Encounter: Payer: Self-pay | Admitting: Home Health Services

## 2012-05-19 ENCOUNTER — Ambulatory Visit: Payer: Self-pay

## 2012-05-19 ENCOUNTER — Encounter (HOSPITAL_COMMUNITY): Payer: Self-pay | Admitting: *Deleted

## 2012-05-19 IMAGING — CT CT ANGIO CHEST
2 of 6 series · 18 of 46 positions shown · IV contrast (APPLIED)
Comparison: Chest radiograph performed earlier today at [DATE] a.m.

CLINICAL DATA: Left-sided chest pain radiating to the back.
History of diabetes and smoking.

CT ANGIOGRAPHY CHEST WITH CONTRAST
TECHNIQUE: Multidetector CT imaging of the chest was performed
using the standard protocol during bolus administration of
intravenous contrast.  Multiplanar CT image reconstructions
including MIPs were obtained to evaluate the vascular anatomy.
Contrast:  100 mL of Omnipaque 300 IV contrast

[Series 5: pe thins @ 1mm · axial · 0.63mm/px · z∈[-55,+186]mm · 15 of 265 slices shown]
[im 12/265  lung]
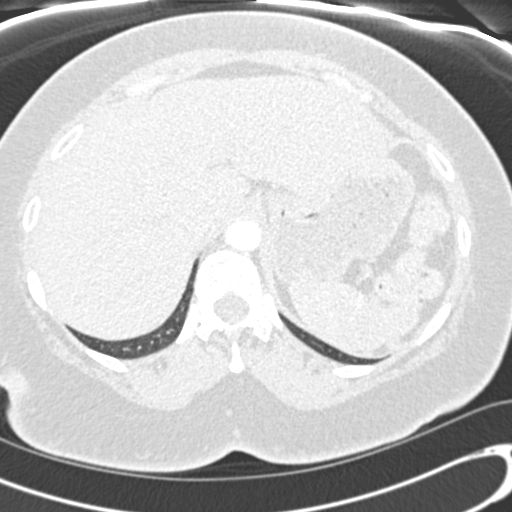
[im 35/265  soft-tissue]
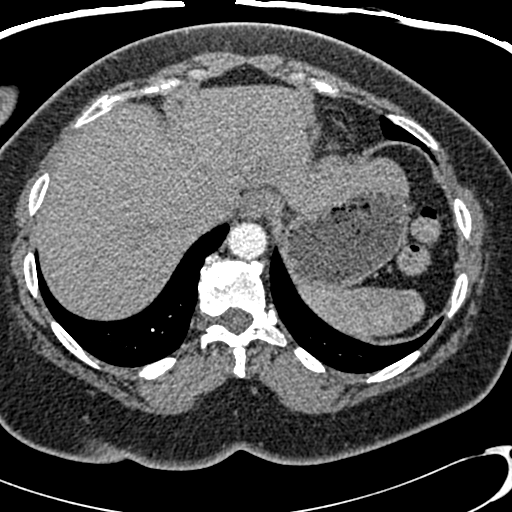
[im 46/265  lung]
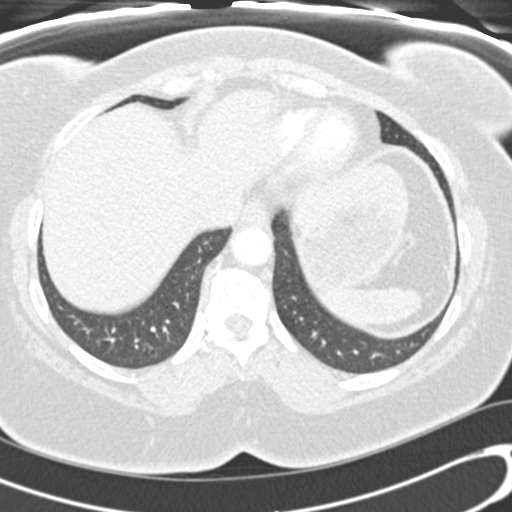
[im 69/265  soft-tissue]
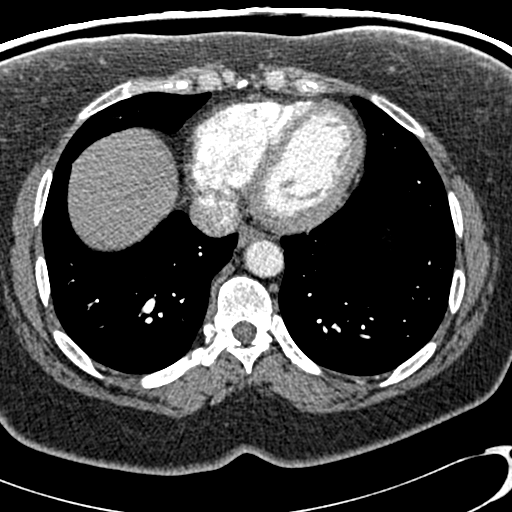
[im 81/265  lung]
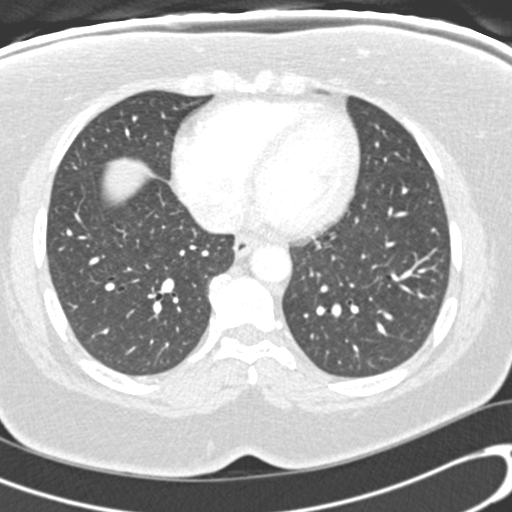
[im 104/265  soft-tissue]
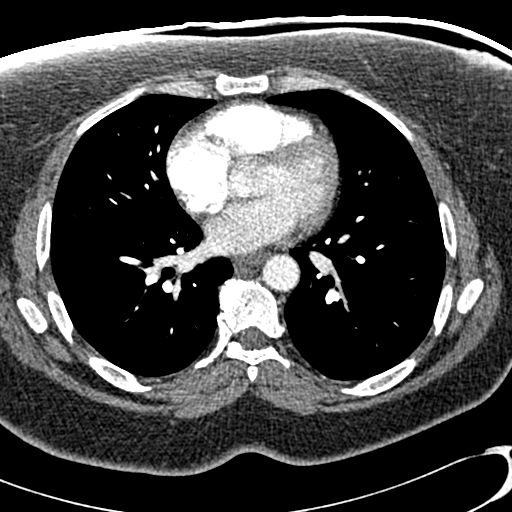
[im 115/265  lung]
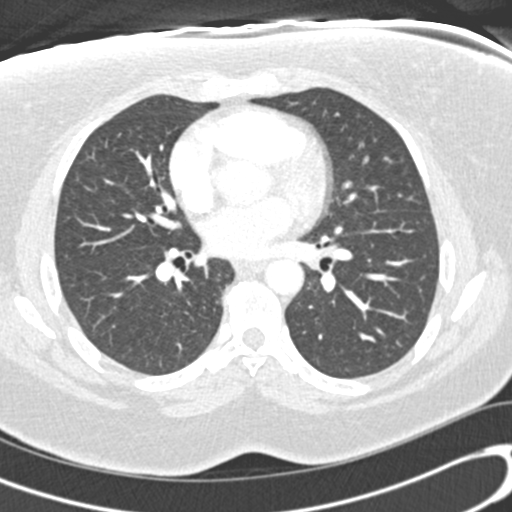
[im 138/265  soft-tissue]
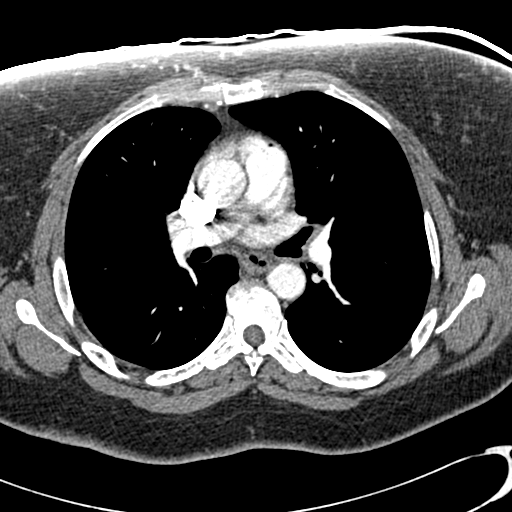
[im 150/265  lung]
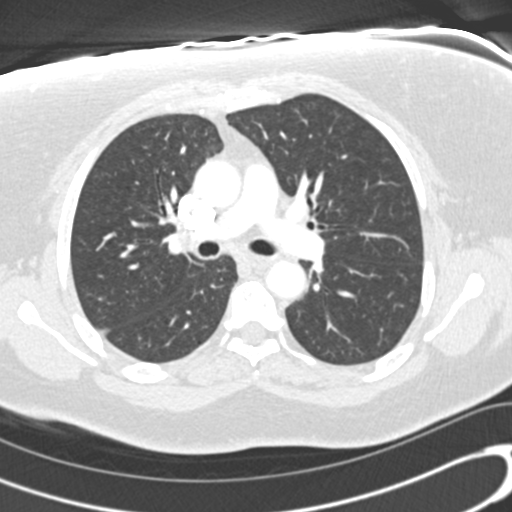
[im 161/265  soft-tissue]
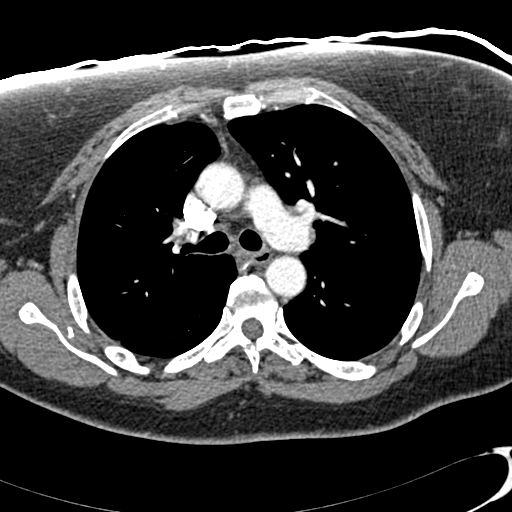
[im 184/265  lung]
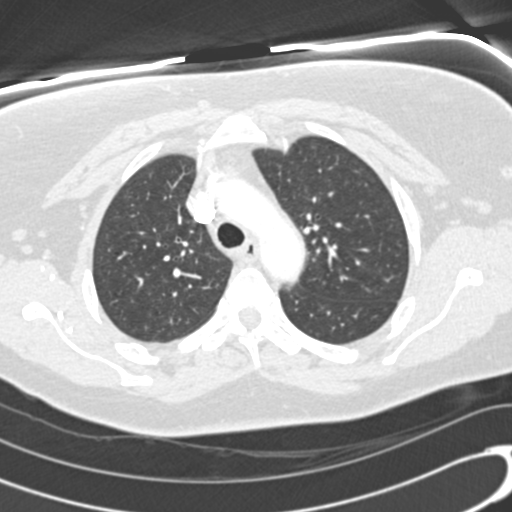
[im 196/265  soft-tissue]
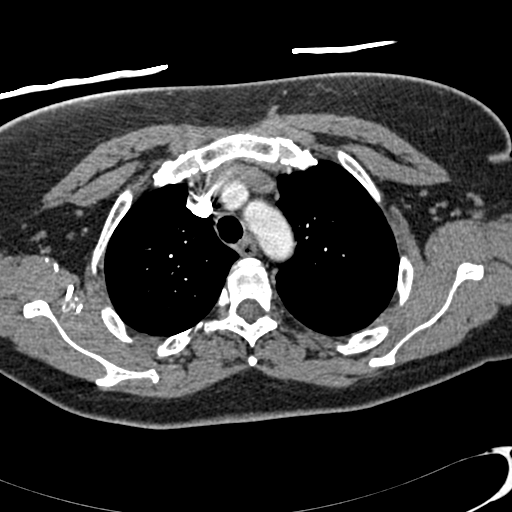
[im 219/265  lung]
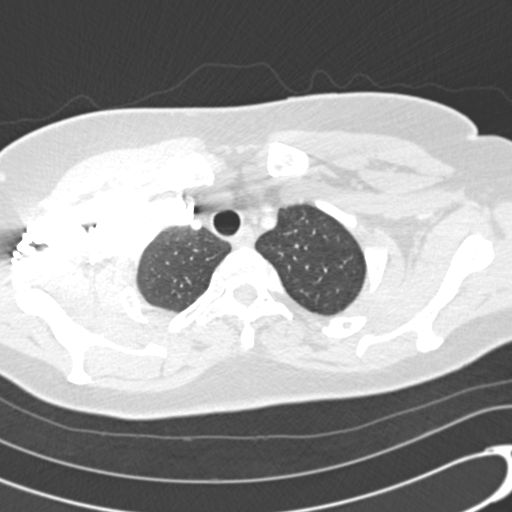
[im 230/265  soft-tissue]
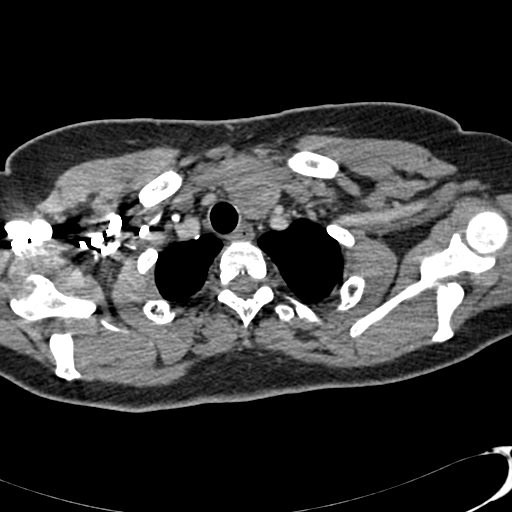
[im 253/265  lung]
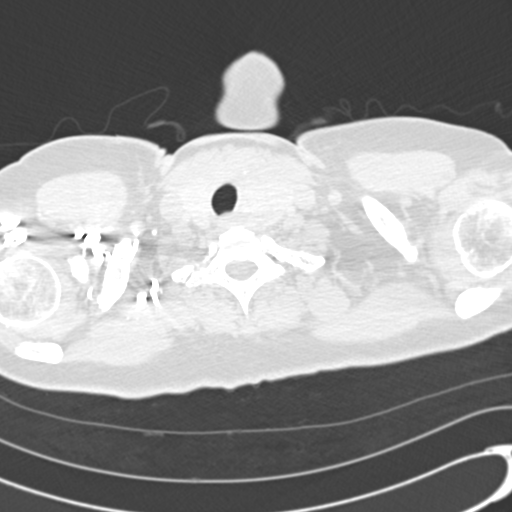

[Series 604: <mpr thick range> · coronal · 0.63mm/px · 3 of 106 slices shown]
[im 27/106  soft-tissue]
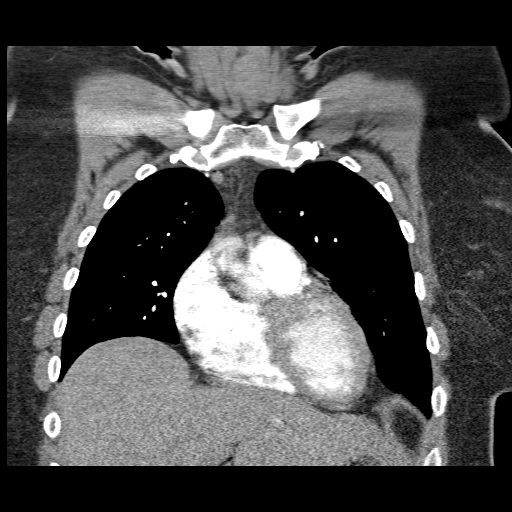
[im 53/106  soft-tissue]
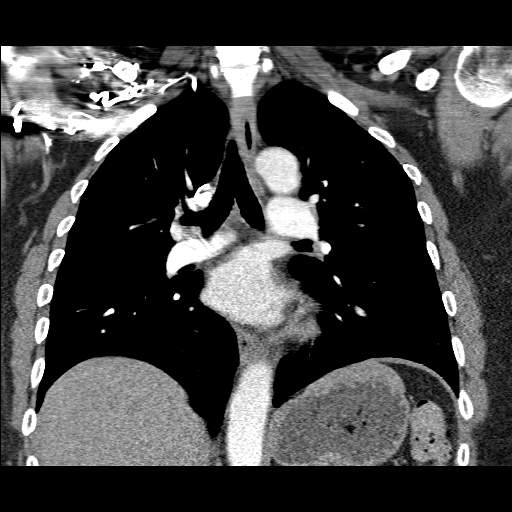
[im 79/106  soft-tissue]
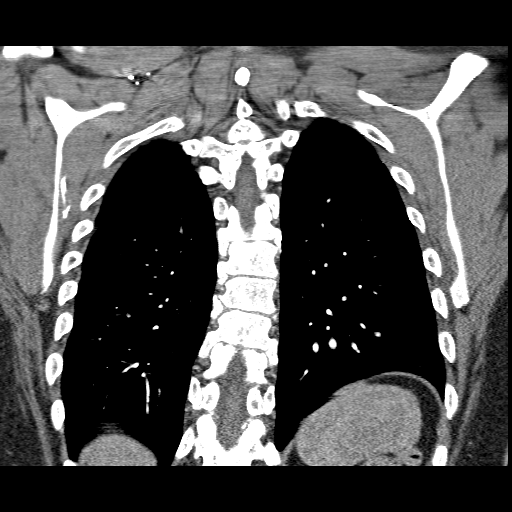

[18 of 46 positions shown; findings below may reference images not displayed]

FINDINGS: There is no evidence of pulmonary embolus.

A single small bleb is noted within the posterior right upper lobe.
The lungs are otherwise clear.  There is no evidence of significant
focal consolidation, pleural effusion or pneumothorax.  No masses
are identified; no abnormal focal contrast enhancement is seen.

The mediastinum is unremarkable in appearance.  There is no
evidence of mediastinal lymphadenopathy.  Scattered small
precarinal and periaortic nodes remain within normal limits.  No
pericardial effusion is seen.  The great vessels are unremarkable
in appearance.  Incidental note is made of the left vertebral
artery arising off the origin of the left subclavian artery.

No axillary lymphadenopathy is seen.  A poorly characterized 4.6 cm
mildly heterogeneous mass is noted arising from the left thyroid
gland.  Thyroid ultrasound would be helpful for further evaluation.

The visualized portions of the liver and spleen are unremarkable.

No acute osseous abnormalities are seen.

Review of the MIP images confirms the above findings.
IMPRESSION: 1.  No evidence of pulmonary embolus.
2.  Single small bleb in the posterior right upper lung lobe; lungs
otherwise clear.
3.  Incidental note of left vertebral artery arising off the origin
of the left subclavian artery.
4.  4.6 cm mildly heterogeneous mass arising from the left thyroid
gland; thyroid ultrasound would be helpful for further evaluation,
to exclude malignancy.

## 2012-06-01 ENCOUNTER — Other Ambulatory Visit: Payer: Self-pay | Admitting: Nurse Practitioner

## 2012-06-01 ENCOUNTER — Encounter (HOSPITAL_COMMUNITY): Payer: Self-pay

## 2012-06-01 ENCOUNTER — Ambulatory Visit (HOSPITAL_COMMUNITY)
Admission: RE | Admit: 2012-06-01 | Discharge: 2012-06-01 | Disposition: A | Discharge status: Home / Self Care | Payer: Self-pay | Source: Ambulatory Visit | Attending: Obstetrics and Gynecology | Admitting: Obstetrics and Gynecology

## 2012-06-01 ENCOUNTER — Other Ambulatory Visit: Payer: Self-pay | Admitting: Obstetrics and Gynecology

## 2012-06-01 VITALS — BP 136/86 | Temp 98.4°F | Ht 66.5 in | Wt 178.0 lb

## 2012-06-01 DIAGNOSIS — N63 Unspecified lump in unspecified breast: Secondary | ICD-10-CM

## 2012-06-01 DIAGNOSIS — N644 Mastodynia: Secondary | ICD-10-CM

## 2012-06-01 DIAGNOSIS — N6320 Unspecified lump in the left breast, unspecified quadrant: Secondary | ICD-10-CM

## 2012-06-01 DIAGNOSIS — Z1239 Encounter for other screening for malignant neoplasm of breast: Secondary | ICD-10-CM

## 2012-06-01 HISTORY — DX: Unspecified lump in the left breast, unspecified quadrant: N63.20

## 2012-06-01 NOTE — Addendum Note (Signed)
Encounter addended by: Saintclair Halsted, RN on: 06/01/2012  2:45 PM<BR>     Documentation filed: Visit Diagnoses, Patient Instructions Section

## 2012-06-01 NOTE — Progress Notes (Signed)
Complaints of left upper breast pain x 2 months that comes and goes. Patient rates pain at a 9-10 out of 10 and describes it as "feels like on fire."  Pap Smear:    Pap smear not performed today. Patients last Pap smear was 09/21/2007 and normal. Per patient she has no history of abnormal Pap smears. Patient stated she has been using monistat 7 cream for a yeast infection. Did not complete Pap smear due to patient using of the Monistat 7 but have rescheduled Pap smear for Tuesday, July 06, 2012. Pap smear result above is in EPIC.  Physical exam: Breasts Breasts symmetrical. No skin abnormalities bilateral breasts. No nipple retraction bilateral breasts. No nipple discharge bilateral breasts. No lymphadenopathy. No lumps palpated right breast. Palpated lump in the left breast around 3:30 o'clock 5 cm from the nipple. Patient complained of tenderness when palpated left breast. [facility] for Diagnostic Mammogram and possible left breast ultrasound. Appointment scheduled for Wednesday, June 09, 2012 at 0900.         Pelvic/Bimanual No Pap smear completed today since patient is currently using Monistat 7 cream. Have rescheduled Pap smear for 07/06/2012.

## 2012-06-01 NOTE — Patient Instructions (Addendum)
Taught patient how to perform BSE and gave educational materials to take home. Did not complete Pap smear today since patient is currently using Monistat 7 cream for a yeast infection. Let her know that she is due for a Pap smear since last Pap smear was 09/21/2007.  Let her know [facility] will cover Pap smears every 3 years unless has a history of abnormal Pap smears. Patient complained of menstrual symptoms that have been changing her mood and are affecting her relationships. [facility]. Appointment scheduled for Thursday, June 17, 2012 at 1500. Let patient know that [facility] does not cover the follow up appointment at the Clinics. Let her know she will need to pay a $20 co-pay and fill out financial assistance paperwork. Patient referred to [facility] for Diagnostic Mammogram and possible left breast ultrasound. Appointment scheduled for Wednesday, June 09, 2012 at 0900. Patient aware of appointments and will be there. Let her know if needs to change either of those appointments to call their offices directly. Patient and daughter verbalized understanding.

## 2012-06-08 NOTE — Addendum Note (Signed)
Encounter addended by: Saintclair Halsted, RN on: 06/08/2012  9:57 AM<BR>     Documentation filed: Visit Diagnoses

## 2012-06-09 ENCOUNTER — Other Ambulatory Visit: Payer: Self-pay

## 2012-06-16 ENCOUNTER — Ambulatory Visit
Admission: RE | Admit: 2012-06-16 | Discharge: 2012-06-16 | Disposition: A | Discharge status: Home / Self Care | Payer: No Typology Code available for payment source | Source: Ambulatory Visit | Attending: Obstetrics and Gynecology | Admitting: Obstetrics and Gynecology

## 2012-06-16 DIAGNOSIS — N644 Mastodynia: Secondary | ICD-10-CM

## 2012-06-16 DIAGNOSIS — N63 Unspecified lump in unspecified breast: Secondary | ICD-10-CM

## 2012-06-17 ENCOUNTER — Encounter: Payer: Self-pay | Admitting: Family

## 2012-07-01 NOTE — Addendum Note (Signed)
Encounter addended by: Saintclair Halsted, RN on: 07/01/2012  4:46 PM<BR>     Documentation filed: Visit Diagnoses, Charges VN

## 2012-07-06 ENCOUNTER — Ambulatory Visit (HOSPITAL_COMMUNITY): Payer: Self-pay

## 2012-08-05 ENCOUNTER — Telehealth: Payer: Self-pay | Admitting: Family Medicine

## 2012-08-05 NOTE — Telephone Encounter (Signed)
Patient reports she has been on metformin for years and she has had diarrhea off and on . She would stop the med for a while then restart.    Night before last developed diarrhea  after taking  metformin. Yesterday AM was better so she took itagain and started diarrhea again. Also started a month ago with tingling  and numbenss in feet . Has appointment 02/12 scheduled. Will forward message to Dr. Clinton Sawyer ( he will be in clinic today ).

## 2012-08-05 NOTE — Telephone Encounter (Signed)
Pt has not been taking her metformin since it makes her sick - she started having symptoms of tingling in her feet and decided to start it again - she started out with 500mg  - it still makes her sick - has an appt w/ [facility] on 2/12 but she would like to talk to nurse about what to do in the mean time.  She doesn't have a DM meter

## 2012-08-05 NOTE — Telephone Encounter (Signed)
Patient notified

## 2012-08-05 NOTE — Telephone Encounter (Signed)
Patient can stop taking metformin until her visit.

## 2012-08-10 ENCOUNTER — Ambulatory Visit (HOSPITAL_COMMUNITY): Payer: Self-pay

## 2012-08-11 ENCOUNTER — Ambulatory Visit: Payer: Self-pay | Admitting: Family Medicine

## 2012-09-15 ENCOUNTER — Encounter: Payer: Self-pay | Admitting: *Deleted

## 2012-09-20 ENCOUNTER — Ambulatory Visit (INDEPENDENT_AMBULATORY_CARE_PROVIDER_SITE_OTHER): Payer: Self-pay | Admitting: Family Medicine

## 2012-09-20 ENCOUNTER — Encounter: Payer: Self-pay | Admitting: Family Medicine

## 2012-09-20 VITALS — BP 139/73 | HR 79 | Temp 98.9°F | Ht 66.5 in | Wt 176.3 lb

## 2012-09-20 DIAGNOSIS — I10 Essential (primary) hypertension: Secondary | ICD-10-CM

## 2012-09-20 DIAGNOSIS — E1165 Type 2 diabetes mellitus with hyperglycemia: Secondary | ICD-10-CM

## 2012-09-20 DIAGNOSIS — E7800 Pure hypercholesterolemia, unspecified: Secondary | ICD-10-CM

## 2012-09-20 DIAGNOSIS — E049 Nontoxic goiter, unspecified: Secondary | ICD-10-CM

## 2012-09-20 MED ORDER — GLYBURIDE 2.5 MG PO TABS: 2.5000 mg | ORAL_TABLET | Freq: Every day | ORAL | Status: DC

## 2012-09-20 NOTE — Patient Instructions (Signed)
Your toes are at the point where we can get things turned around by turning around your blood sugars.  Set up a time to meet with Britta Mccreedy.  Come back as soon as this is done.   Come back in the next 1-2 weeks to get your blood levels checked.  Set up a lab appointment.  I will send in the medication for you.  Take this once a day with food.    I will get you set up with the health coach after you see Britta Mccreedy.

## 2012-09-21 ENCOUNTER — Other Ambulatory Visit: Payer: Self-pay

## 2012-09-21 NOTE — Assessment & Plan Note (Signed)
Endorses that lisinopril and ASA are the only medications she takes -- though not regularly for these either.

## 2012-09-21 NOTE — Assessment & Plan Note (Signed)
Finances main cncern for today. She does not have the [employer]. She does have information which she needs to meet with Britta Mccreedy.  Has this with her today.   Will start on Glyburide, but not likely to obtain goal A1C without insulin. Long discussion (>25 minutes face time with patient) regarding diabetes and its long-term implications.  She knows most of this and has even seen a nutritionist in past. She needs referral to both health coaching as well as pharmacy clinic.  Can place these once [employer] in place.   Will forward note to our health coaching team so she is not lost to follow-up.

## 2012-09-21 NOTE — Assessment & Plan Note (Signed)
TSH next blood draw.

## 2012-09-21 NOTE — Assessment & Plan Note (Signed)
Lipid panel next blood draw.  Needs statin no matter her panel results.  This will be a long conversation with her as she doesn't want to be on "too many medications."

## 2012-09-21 NOTE — Progress Notes (Signed)
Subjective:    Kristen Garrett is a 52 y.o. female who presents to [facility] today with complaints of toe numbness:  1.  Toe numbness:  Present for past 1-2 months.  States "I know this is because of my diabetes."  Bilateral toes.  Worse at night.  Also with paresthesias.  Denies any overt "burning pain" in feet, mostly tingling and "pins and needles."  No injuries to feet.  No back pain.  No other numbness symptoms elsewhere  2.   Diabetes:  Currently on not taking anything, has not been for "many months."  Previously on Metformin but took herself off of this secondary to GI distress.   Last A1C as below.  As above for paresthesias..  Measures blood sugars at home every:    Does not measure.  Has been out of medications due to insurance reasons.  Never had eye/retinal exam.   Lab Results  Component Value Date   HGBA1C >14.0 10/10/2011    3.   HLD:  Last lipid panel listed below.    Currently is not on Statin due finances.   Lab Results  Component Value Date   CHOL 209* 10/10/2011   CHOL 211* 12/20/2008   CHOL 183 09/24/2007   Lab Results  Component Value Date   HDL 38* 10/10/2011   HDL 31* 12/20/2008   HDL 33* 09/24/2007   Lab Results  Component Value Date   LDLCALC 133* 10/10/2011   LDLCALC See Comment mg/dL 1/61/0960   LDLCALC 98 09/24/2007   Lab Results  Component Value Date   TRIG 189* 10/10/2011   TRIG 454* 12/20/2008   TRIG 261* 09/24/2007   Lab Results  Component Value Date   CHOLHDL 5.5 10/10/2011   CHOLHDL 6.8 Ratio 12/20/2008   CHOLHDL 5.5 Ratio 09/24/2007      The following portions of the patient's history were reviewed and updated as appropriate: allergies, current medications, past medical history, family and social history, and problem list. Patient is a nonsmoker.    PMH reviewed.  Past Medical History  Diagnosis Date  . Diabetes mellitus   . Hyperlipidemia   . Hyperlipidemia    Past Surgical History  Procedure Laterality Date  . Tubal ligation    . Endometrial  ablation w/ novasure      Medications reviewed. Current Outpatient Prescriptions  Medication Sig Dispense Refill  . aspirin 81 MG chewable tablet Chew 81 mg by mouth daily.        Marland Kitchen glyBURIDE (DIABETA) 2.5 MG tablet Take 1 tablet (2.5 mg total) by mouth daily with breakfast.  30 tablet  1  . HYDROcodone-acetaminophen (NORCO/VICODIN) 5-325 MG per tablet Take 1 tablet by mouth every 4 (four) hours as needed for pain.  20 tablet  0  . ibuprofen (ADVIL,MOTRIN) 800 MG tablet Take 1 tablet (800 mg total) by mouth every 8 (eight) hours as needed for pain.  60 tablet  0  . lisinopril (PRINIVIL,ZESTRIL) 20 MG tablet Take 2 tablets (40 mg total) by mouth daily.  180 tablet  11  . oxymetazoline (AFRIN) 0.05 % nasal spray Place 2 sprays into the nose 2 (two) times daily as needed. For congestion       No current facility-administered medications for this visit.    ROS as above otherwise neg.  No chest pain, palpitations, SOB, Fever, Chills, Abd pain, N/V/D.   Objective:   Physical Exam BP 139/73  Pulse 79  Temp(Src) 98.9 F (37.2 C) (Oral)  Ht 5' 6.5" (1.689 m)  Wt 176 lb 4.8 oz (79.969 kg)  BMI 28.03 kg/m2 Gen:  Alert, cooperative patient who appears stated age in no acute distress.  Vital signs reviewed. HEENT: EOMI,  MMM Cardiac:  Regular rate and rhythm, Grade I/VI SEM noted.   Pulm:  Clear to auscultation bilaterally with good air movement.  No wheezes or rales noted.   Abd:  Soft/nondistended/nontender.  Good bowel sounds throughout all four quadrants.  No masses noted.  Exts: Non edematous BL  LE, warm and well perfused.  Psych:  Appears somewhat anxious Neuro:  Alert and oriented.  Proprioception intact BL feet.  Diminished but still somewhat intact sensation to monofilament testing, even distal toes.  Strength 5/5 BL feet.    No results found for this or any previous visit (from the past 72 hour(s)).

## 2012-09-22 ENCOUNTER — Other Ambulatory Visit (INDEPENDENT_AMBULATORY_CARE_PROVIDER_SITE_OTHER): Payer: Self-pay

## 2012-09-22 DIAGNOSIS — E049 Nontoxic goiter, unspecified: Secondary | ICD-10-CM

## 2012-09-22 DIAGNOSIS — IMO0001 Reserved for inherently not codable concepts without codable children: Secondary | ICD-10-CM

## 2012-09-22 DIAGNOSIS — E7800 Pure hypercholesterolemia, unspecified: Secondary | ICD-10-CM

## 2012-09-22 LAB — LIPID PANEL
Cholesterol: 196 mg/dL (ref 0–200)
HDL: 32 mg/dL — ABNORMAL LOW (ref >39)
LDL Cholesterol: 136 mg/dL — ABNORMAL HIGH (ref 0–99)
Triglycerides: 142 mg/dL (ref <150)

## 2012-09-22 NOTE — Progress Notes (Signed)
FLP,TSH AND A1C DONE TODAY Kristen Garrett

## 2012-09-23 ENCOUNTER — Encounter: Payer: Self-pay | Admitting: Family Medicine

## 2012-09-23 ENCOUNTER — Other Ambulatory Visit: Payer: Self-pay | Admitting: Family Medicine

## 2012-09-23 DIAGNOSIS — E119 Type 2 diabetes mellitus without complications: Secondary | ICD-10-CM

## 2012-10-05 ENCOUNTER — Telehealth: Payer: Self-pay | Admitting: Family Medicine

## 2012-10-05 NOTE — Telephone Encounter (Signed)
Now that the blood tests have been done, the patient needs to know what the dosage of the Glyburide is going to be and that med with the new dose needs to be sent to [facility] on [address].  And it was Dr. Gwendolyn Grant that prescribed this.  Also, the dose she is currently taking is 2.5mg  and she wants to know if she can take 5mg .

## 2012-10-06 MED ORDER — GLYBURIDE 5 MG PO TABS: 5.0000 mg | ORAL_TABLET | Freq: Every day | ORAL | Status: DC

## 2012-10-06 NOTE — Telephone Encounter (Signed)
I have never seen this patient. Therefore, I will defer to Dr. Gwendolyn Grant who saw her in clinic for this issue.

## 2012-10-06 NOTE — Telephone Encounter (Signed)
Will increase to 5 mg daily.  She needs to be evaluated at Pharmacy clinic for better diabetic control as she's not tolerant of Metformin.    White Team, can you schedule this patient for Pharmacy clinic BEFORE calling her about the 5 mg daily?  That way she can come right in for Pharmacy clinic.  Thanks, Trey Paula

## 2012-10-07 NOTE — Telephone Encounter (Signed)
Left message on voicemail also requested a return call. Kristen Garrett, Kristen Garrett

## 2012-10-18 ENCOUNTER — Encounter: Payer: Self-pay | Admitting: *Deleted

## 2012-10-18 ENCOUNTER — Telehealth (HOSPITAL_COMMUNITY): Payer: Self-pay | Admitting: *Deleted

## 2012-10-18 ENCOUNTER — Encounter (HOSPITAL_COMMUNITY): Payer: Self-pay | Admitting: *Deleted

## 2012-10-18 NOTE — Telephone Encounter (Signed)
Telephoned home # and left message to return call to [facility] 

## 2012-10-18 NOTE — Telephone Encounter (Signed)
Patient returned call. Advised patient was calling to schedule pap smear. Patient states not able to come May 20 due to working and not able to schedule May 27. Patient will call back to schedule.

## 2012-10-25 ENCOUNTER — Other Ambulatory Visit: Payer: Self-pay | Admitting: *Deleted

## 2012-10-26 MED ORDER — LISINOPRIL 20 MG PO TABS: 40.0000 mg | ORAL_TABLET | Freq: Every day | ORAL | Status: DC

## 2012-11-02 ENCOUNTER — Encounter: Payer: Self-pay | Admitting: Family Medicine

## 2012-11-02 ENCOUNTER — Ambulatory Visit (INDEPENDENT_AMBULATORY_CARE_PROVIDER_SITE_OTHER): Payer: Self-pay | Admitting: Family Medicine

## 2012-11-02 VITALS — BP 150/82 | HR 80 | Temp 99.1°F | Ht 66.5 in | Wt 182.0 lb

## 2012-11-02 DIAGNOSIS — E1149 Type 2 diabetes mellitus with other diabetic neurological complication: Secondary | ICD-10-CM

## 2012-11-02 DIAGNOSIS — E1165 Type 2 diabetes mellitus with hyperglycemia: Secondary | ICD-10-CM

## 2012-11-02 DIAGNOSIS — E1142 Type 2 diabetes mellitus with diabetic polyneuropathy: Secondary | ICD-10-CM

## 2012-11-02 DIAGNOSIS — E785 Hyperlipidemia, unspecified: Secondary | ICD-10-CM

## 2012-11-02 DIAGNOSIS — E7800 Pure hypercholesterolemia, unspecified: Secondary | ICD-10-CM

## 2012-11-02 MED ORDER — ATORVASTATIN CALCIUM 40 MG PO TABS: 40.0000 mg | ORAL_TABLET | Freq: Every day | ORAL | Status: DC

## 2012-11-02 MED ORDER — GLYBURIDE 5 MG PO TABS: 10.0000 mg | ORAL_TABLET | Freq: Every day | ORAL | Status: DC

## 2012-11-02 NOTE — Progress Notes (Signed)
  Subjective:    Patient ID: Kristen Garrett, female    DOB: 1960-09-06, 52 y.o.   MRN: [id number]  HPI  52 year old F with type 2 DM and HTN who presents for evaluation of left toe pain and tingling. Located in 3rd, 4th, 5th digits of left foot. Started in past several months and very concerning to patient. Patient evaluated for this problem 3/24 by Dr. Gwendolyn Grant and concern was for diabetic neuropathy. At the time an A1C was 9.4 and her lipids were checked as well.    Review of Systems     Objective:   Physical Exam BP 150/82  Pulse 80  Temp(Src) 99.1 F (37.3 C) (Oral)  Ht 5' 6.5" (1.689 m)  Wt 182 lb (82.555 kg)  BMI 28.94 kg/m2  Gen: middle aged AAF, non ill appearing, pleasant and conversant  Left foot: monofilament test negative, no ulcers, 2+ distal pulses, good cap refill, no skin changes, digits normal appearing Right foot: monofilament test negative, no ulcers, 2+ distal pulses, good cap refill, no skin changes, digits normal appearing      Assessment & Plan:

## 2012-11-03 DIAGNOSIS — E1142 Type 2 diabetes mellitus with diabetic polyneuropathy: Secondary | ICD-10-CM | POA: Insufficient documentation

## 2012-11-03 NOTE — Assessment & Plan Note (Signed)
Small fiber neuropathy as monofilament testing still normal. This is very concerning to patient but not bothersome enough to want medication. Instructed to always wear protective shoes. Continue to follow.

## 2012-11-03 NOTE — Assessment & Plan Note (Signed)
Patient with DM and LDL > 70, so needs statin therapy. Started on lipitor 40 mg.

## 2012-11-03 NOTE — Assessment & Plan Note (Signed)
Patient will increase Glyburide to 10 mg daily and will likely need another agent. Cannot tolerate Metformin. Follow up in 2 weeks.

## 2012-11-11 ENCOUNTER — Encounter: Payer: Self-pay | Admitting: Family Medicine

## 2012-12-23 ENCOUNTER — Telehealth (HOSPITAL_COMMUNITY): Payer: Self-pay | Admitting: *Deleted

## 2012-12-23 NOTE — Telephone Encounter (Signed)
Telephoned patient at home # and left message to return call to [facility] 

## 2013-01-12 ENCOUNTER — Encounter: Payer: Self-pay | Admitting: Family Medicine

## 2013-01-12 ENCOUNTER — Telehealth: Payer: Self-pay | Admitting: Family Medicine

## 2013-01-12 ENCOUNTER — Ambulatory Visit (INDEPENDENT_AMBULATORY_CARE_PROVIDER_SITE_OTHER): Payer: 59 | Admitting: Family Medicine

## 2013-01-12 VITALS — BP 150/88 | HR 89 | Ht 65.6 in | Wt 179.0 lb

## 2013-01-12 DIAGNOSIS — M79662 Pain in left lower leg: Secondary | ICD-10-CM

## 2013-01-12 DIAGNOSIS — E1165 Type 2 diabetes mellitus with hyperglycemia: Secondary | ICD-10-CM

## 2013-01-12 DIAGNOSIS — M79661 Pain in right lower leg: Secondary | ICD-10-CM | POA: Insufficient documentation

## 2013-01-12 DIAGNOSIS — M79604 Pain in right leg: Secondary | ICD-10-CM

## 2013-01-12 DIAGNOSIS — M79609 Pain in unspecified limb: Secondary | ICD-10-CM

## 2013-01-12 DIAGNOSIS — IMO0002 Reserved for concepts with insufficient information to code with codable children: Secondary | ICD-10-CM

## 2013-01-12 LAB — POCT GLYCOSYLATED HEMOGLOBIN (HGB A1C): Hemoglobin A1C: 7.8

## 2013-01-12 LAB — D-DIMER, QUANTITATIVE: D-Dimer, Quant: 1.12 ug/mL-FEU — ABNORMAL HIGH (ref 0.00–0.48)

## 2013-01-12 NOTE — Assessment & Plan Note (Signed)
Differential diagnosis includes neuropathic-type pain from years of uncontrolled DM type 2, DVT, statin myopathy, distal myositis. While DVT is unlikely, this is most concerning given her history of this problem.  - Check D-dimer, if elevated, obtain bilateral duplex doppler of LE - Obtain CK and ESR to look for muscle injury - F/u in 1 week

## 2013-01-12 NOTE — Progress Notes (Signed)
Subjective:     Patient ID: Kristen Garrett, female   DOB: [birth year], 52 y.o.   MRN: [id number]  HPI  52 year old F who presents today with numerous complaints. Her most concerning issue is bilateral leg pain.   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52, unprovoked, cannot remember what side, did take blood thinner for several month Denies: shortness of breath, pain or soreness in thighs, arms and shoulder, denies fever, weight loss or chills   Review of Systems Positive for irregular menstrual cycles, intermittent dysphoria, toe pain and coldness, desire to quit smoking     Objective:   Physical Exam BP 150/88  Pulse 89  Ht 5' 5.6" (1.666 m)  Wt 179 lb (81.194 kg)  BMI 29.25 kg/m2 Gen: middle aged AAF, non ill appearing, talkative Lungs: normal work of breathing, clear to auscultation bilaterally  MSK:   - upper extremities: no tenderness, 5/5 strength bilaterally  - Lower extremities: no tenderness of thighs; marked tenderness of calves bilaterally, mild edema; left calf circumference 38 cm, right calf circumference 37 cm      Assessment:     52 year old F with history of unprovoked DVT and bilateral calf pain and swelling.     Plan:

## 2013-01-12 NOTE — Telephone Encounter (Signed)
Please let the patient know that I would like her to have doppler studies done on her legs on Thursday to make sure she doesn't have a blood clot. I have already placed an order.

## 2013-01-12 NOTE — Patient Instructions (Signed)
Ms. Strom,   We will check labs today and have you come back next week for a Pap smear.   My nurse will call you tomorrow to let you know whether or not we need the ultrasound of the legs.   Sincerely,   Dr. Clinton Sawyer

## 2013-01-13 ENCOUNTER — Emergency Department (HOSPITAL_COMMUNITY)
Admission: EM | Admit: 2013-01-13 | Discharge: 2013-01-13 | Disposition: A | Discharge status: Home / Self Care | Payer: 59 | Attending: Emergency Medicine | Admitting: Emergency Medicine

## 2013-01-13 ENCOUNTER — Inpatient Hospital Stay (HOSPITAL_COMMUNITY): Admission: RE | Admit: 2013-01-13 | Payer: 59 | Source: Ambulatory Visit

## 2013-01-13 ENCOUNTER — Telehealth: Payer: Self-pay | Admitting: *Deleted

## 2013-01-13 ENCOUNTER — Encounter (HOSPITAL_COMMUNITY): Payer: Self-pay | Admitting: Adult Health

## 2013-01-13 DIAGNOSIS — M79669 Pain in unspecified lower leg: Secondary | ICD-10-CM

## 2013-01-13 DIAGNOSIS — M79609 Pain in unspecified limb: Secondary | ICD-10-CM | POA: Insufficient documentation

## 2013-01-13 DIAGNOSIS — Z7982 Long term (current) use of aspirin: Secondary | ICD-10-CM | POA: Insufficient documentation

## 2013-01-13 DIAGNOSIS — Z79899 Other long term (current) drug therapy: Secondary | ICD-10-CM | POA: Insufficient documentation

## 2013-01-13 DIAGNOSIS — E785 Hyperlipidemia, unspecified: Secondary | ICD-10-CM | POA: Insufficient documentation

## 2013-01-13 DIAGNOSIS — E119 Type 2 diabetes mellitus without complications: Secondary | ICD-10-CM | POA: Insufficient documentation

## 2013-01-13 DIAGNOSIS — Z86718 Personal history of other venous thrombosis and embolism: Secondary | ICD-10-CM | POA: Insufficient documentation

## 2013-01-13 DIAGNOSIS — F17200 Nicotine dependence, unspecified, uncomplicated: Secondary | ICD-10-CM | POA: Insufficient documentation

## 2013-01-13 DIAGNOSIS — M7989 Other specified soft tissue disorders: Secondary | ICD-10-CM

## 2013-01-13 DIAGNOSIS — IMO0001 Reserved for inherently not codable concepts without codable children: Secondary | ICD-10-CM | POA: Insufficient documentation

## 2013-01-13 LAB — COMPREHENSIVE METABOLIC PANEL
Albumin: 3.9 g/dL (ref 3.5–5.2)
Alkaline Phosphatase: 87 U/L (ref 39–117)
BUN: 11 mg/dL (ref 6–23)
CO2: 27 mEq/L (ref 19–32)
Calcium: 9.3 mg/dL (ref 8.4–10.5)
Glucose, Bld: 226 mg/dL — ABNORMAL HIGH (ref 70–99)
Potassium: 4.1 mEq/L (ref 3.5–5.3)
Sodium: 135 mEq/L (ref 135–145)
Total Protein: 6.4 g/dL (ref 6.0–8.3)

## 2013-01-13 LAB — SEDIMENTATION RATE: Sed Rate: 7 mm/hr (ref 0–22)

## 2013-01-13 LAB — CK: Total CK: 122 U/L (ref 7–177)

## 2013-01-13 LAB — CBC
HCT: 38 % (ref 36.0–46.0)
Hemoglobin: 12.6 g/dL (ref 12.0–15.0)
MCHC: 33.2 g/dL (ref 30.0–36.0)
RBC: 4.72 MIL/uL (ref 3.87–5.11)

## 2013-01-13 NOTE — ED Provider Notes (Signed)
Medical screening examination/treatment/procedure(s) were performed by non-physician practitioner and as supervising physician I was immediately available for consultation/collaboration.   Marice Angelino Ann Dustyn Armbrister, MD 01/13/13 2309 

## 2013-01-13 NOTE — Telephone Encounter (Signed)
Pt called back. I told the pt, that I would ask Dr.Williamson if she needs the doppler. I will call her back. Kristen Garrett, [name]

## 2013-01-13 NOTE — Telephone Encounter (Signed)
I left message for patient that all labs were normal except D-dimer slightly elevated. Therefore, I would like to get ultrasound studies of her legs to rule out a blood clot. I noted that the nurse would schedule the appointment with the vascular lab and call her afterwards.

## 2013-01-13 NOTE — Telephone Encounter (Signed)
1.) scheduled appt Monday at 4:00 pm. 2.)called pt. Informed of appt. Also informed of Dr.Williamson message. Pt verbalized understanding. [name], [name]

## 2013-01-13 NOTE — ED Provider Notes (Signed)
History    This chart was scribed for Wynetta Emery, non-physician practitioner working with Ashby Dawes, MD by Leone Payor, ED Scribe. This patient was seen in room TR07C/TR07C and the patient's care was started at 1732.  CSN: [id number] Arrival date & time 01/13/13  1732  First MD Initiated Contact with Patient 01/13/13 1748     Chief Complaint  Patient presents with  . Leg Pain    The history is provided by the patient. No language interpreter was used.    HPI Comments: Kristen Garrett is a 52 y.o. female with past medical history of DVT of lower limb, DM, HLD who presents to the Emergency Department complaining of ongoing bilateral lower leg pain starting several months ago. Pt states she was sent her by PCP to have a vascular ultrasound performed to rule out DVTs bilaterally. Pt takes a daily aspirin but denies any regular blood thinners. She rates the pain as 9/10 at max. She has taken ibuprofen with some relief. States her pain is dependent on the type of shoes she was wearing during the day. She denies SOB, chest pain.   Past Medical History  Diagnosis Date  . Diabetes mellitus   . Hyperlipidemia   . Hyperlipidemia   . DVT of lower limb, acute Age 40    Went on blood thinner for a few months   Past Surgical History  Procedure Laterality Date  . Tubal ligation    . Endometrial ablation w/ novasure     Family History  Problem Relation Age of Onset  . Diabetes Mother   . Hypertension Mother   . Hyperlipidemia Mother   . Diabetes Father   . Hyperlipidemia Father    History  Substance Use Topics  . Smoking status: Current Every Day Smoker -- 0.50 packs/day  . Smokeless tobacco: Not on file  . Alcohol Use: No   OB History   Grav Para Term Preterm Abortions TAB SAB Ect Mult Living   3 2 1 1      2      Review of Systems  Constitutional: Negative for fever.  Respiratory: Negative for shortness of breath.   Cardiovascular: Negative for chest pain.   Gastrointestinal: Negative for nausea, vomiting, abdominal pain and diarrhea.  Musculoskeletal: Positive for myalgias.  All other systems reviewed and are negative.    Allergies  Review of patient's allergies indicates no known allergies.  Home Medications   Current Outpatient Rx  Name  Route  Sig  Dispense  Refill  . aspirin 81 MG chewable tablet   Oral   Chew 81 mg by mouth daily.           Marland Kitchen atorvastatin (LIPITOR) 40 MG tablet   Oral   Take 1 tablet (40 mg total) by mouth daily.   30 tablet   11   . glyBURIDE (DIABETA) 5 MG tablet   Oral   Take 2 tablets (10 mg total) by mouth daily with breakfast.   60 tablet   11   . ibuprofen (ADVIL,MOTRIN) 800 MG tablet   Oral   Take 1 tablet (800 mg total) by mouth every 8 (eight) hours as needed for pain.   60 tablet   0     NEEDS MD APPOINTMENT   . lisinopril (PRINIVIL,ZESTRIL) 20 MG tablet   Oral   Take 2 tablets (40 mg total) by mouth daily.   180 tablet   3   . oxymetazoline (AFRIN) 0.05 % nasal spray  Nasal   Place 2 sprays into the nose 2 (two) times daily as needed. For congestion          BP 130/82  Pulse 92  Temp(Src) 99.2 F (37.3 C) (Oral)  SpO2 97% Physical Exam  Nursing note and vitals reviewed. Constitutional: She is oriented to person, place, and time. She appears well-developed and well-nourished. No distress.  HENT:  Head: Normocephalic.  Eyes: Conjunctivae and EOM are normal.  Cardiovascular: Normal rate.   Pulmonary/Chest: Effort normal. No stridor.  Musculoskeletal: Normal range of motion.  Bilateral varicosities. No calf asymmetry, superficial collaterals, palpable cords, edema, Homans sign negative bilaterally.    Neurological: She is alert and oriented to person, place, and time.  Psychiatric: She has a normal mood and affect.    ED Course  Procedures (including critical care time)  DIAGNOSTIC STUDIES: Oxygen Saturation is 97% on RA, adequate by my interpretation.     COORDINATION OF CARE: 6:00 PM Discussed treatment plan with pt at bedside and pt agreed to plan.   Labs Reviewed - No data to display No results found.  1. Calf pain, unspecified laterality     MDM   Filed Vitals:   01/13/13 1742  BP: 130/82  Pulse: 92  Temp: 99.2 F (37.3 C)  TempSrc: Oral  SpO2: 97%     Kristen Garrett is a 52 y.o. female with bilateral calf pain worsening over the course last several months patient has a history of DVT, is not currently anticoagulated.   Verbal report from sonographer says the patient has no SVT or DVTs.  Pt is hemodynamically stable, appropriate for, and amenable to discharge at this time. Pt verbalized understanding and agrees with care plan. Outpatient follow-up and specific return precautions discussed.     Wynetta Emery, PA-C 01/13/13 1906

## 2013-01-13 NOTE — Progress Notes (Signed)
VASCULAR LAB PRELIMINARY  PRELIMINARY  PRELIMINARY  PRELIMINARY  Bilateral lower extremity venous Dopplers completed.    Preliminary report:  There is no DVT or SVT noted in the bilateral lower extremities.  Carlton Buskey, RVT 01/13/2013, 6:32 PM

## 2013-01-13 NOTE — Telephone Encounter (Signed)
I am concerned about the patient waiting until Monday to have an venous duplex doppler ultrasound of her legs. Therefore, I called and left a message that the patient should go to the emergency department after work for evaluation of a possible blood clot.

## 2013-01-13 NOTE — Telephone Encounter (Signed)
Pt needs doppler studies (order in chart) we will schedule it, once pt calls back. Left message for pt to call back. [name], [name]

## 2013-01-13 NOTE — ED Notes (Signed)
Pt reports months of bilateral leg pain, no swelling noted. Sent over by Doctor to have vascular ultrasound to r/o DVTs bilaterally. Denies SOB, denies chest pain.

## 2013-01-17 ENCOUNTER — Ambulatory Visit (HOSPITAL_COMMUNITY): Payer: 59 | Attending: Family Medicine

## 2013-01-31 ENCOUNTER — Other Ambulatory Visit (HOSPITAL_COMMUNITY)
Admission: RE | Admit: 2013-01-31 | Discharge: 2013-01-31 | Disposition: A | Discharge status: Home / Self Care | Payer: 59 | Source: Ambulatory Visit | Attending: Family Medicine | Admitting: Family Medicine

## 2013-01-31 ENCOUNTER — Encounter: Payer: Self-pay | Admitting: Family Medicine

## 2013-01-31 ENCOUNTER — Ambulatory Visit (INDEPENDENT_AMBULATORY_CARE_PROVIDER_SITE_OTHER): Payer: 59 | Admitting: Family Medicine

## 2013-01-31 VITALS — BP 120/78 | HR 91 | Ht 66.5 in | Wt 175.0 lb

## 2013-01-31 DIAGNOSIS — A599 Trichomoniasis, unspecified: Secondary | ICD-10-CM

## 2013-01-31 DIAGNOSIS — Z1151 Encounter for screening for human papillomavirus (HPV): Secondary | ICD-10-CM | POA: Insufficient documentation

## 2013-01-31 DIAGNOSIS — Z124 Encounter for screening for malignant neoplasm of cervix: Secondary | ICD-10-CM

## 2013-01-31 DIAGNOSIS — Z0000 Encounter for general adult medical examination without abnormal findings: Secondary | ICD-10-CM

## 2013-01-31 DIAGNOSIS — N898 Other specified noninflammatory disorders of vagina: Secondary | ICD-10-CM

## 2013-01-31 DIAGNOSIS — Z01419 Encounter for gynecological examination (general) (routine) without abnormal findings: Secondary | ICD-10-CM | POA: Insufficient documentation

## 2013-01-31 DIAGNOSIS — M79661 Pain in right lower leg: Secondary | ICD-10-CM

## 2013-01-31 DIAGNOSIS — M79609 Pain in unspecified limb: Secondary | ICD-10-CM

## 2013-01-31 LAB — POCT WET PREP (WET MOUNT)

## 2013-01-31 MED ORDER — METRONIDAZOLE 375 MG PO CAPS: 375.0000 mg | ORAL_CAPSULE | Freq: Two times a day (BID) | ORAL | Status: DC

## 2013-01-31 NOTE — Patient Instructions (Addendum)
Dear Kristen Garrett,   You have trichomonas. This is easily treated with Flagyl. You husband could have it too, so he should go to an urgent care to be tested and treated if needed.   Please follow up in 2 weeks.   Sincerely,   Dr. Clinton Sawyer     Trichomoniasis Trichomoniasis is an infection, caused by the Trichomonas organism, that affects both women and men. In women, the outer female genitalia and the vagina are affected. In men, the penis is mainly affected, but the prostate and other reproductive organs can also be involved. Trichomoniasis is a sexually transmitted disease (STD) and is most often passed to another person through sexual contact. The majority of people who get trichomoniasis do so from a sexual encounter and are also at risk for other STDs. CAUSES   Sexual intercourse with an infected partner.  It can be present in swimming pools or hot tubs. SYMPTOMS   Abnormal gray-green frothy vaginal discharge in women.  Vaginal itching and irritation in women.  Itching and irritation of the area outside the vagina in women.  Penile discharge with or without pain in males.  Inflammation of the urethra (urethritis), causing painful urination.  Bleeding after sexual intercourse. RELATED COMPLICATIONS  Pelvic inflammatory disease.  Infection of the uterus (endometritis).  Infertility.  Tubal (ectopic) pregnancy.  It can be associated with other STDs, including gonorrhea and chlamydia, hepatitis B, and HIV. COMPLICATIONS DURING PREGNANCY  Early (premature) delivery.  Premature rupture of the membranes (PROM).  Low birth weight. DIAGNOSIS   Visualization of Trichomonas under the microscope from the vagina discharge.  Ph of the vagina greater than 4.5, tested with a test tape.  Trich Rapid Test.  Culture of the organism, but this is not usually needed.  It may be found on a Pap test.  Having a "strawberry cervix,"which means the cervix looks very red like a  strawberry. TREATMENT   You may be given medication to fight the infection. Inform your caregiver if you could be or are pregnant. Some medications used to treat the infection should not be taken during pregnancy.  Over-the-counter medications or creams to decrease itching or irritation may be recommended.  Your sexual partner will need to be treated if infected. HOME CARE INSTRUCTIONS   Take all medication prescribed by your caregiver.  Take over-the-counter medication for itching or irritation as directed by your caregiver.  Do not have sexual intercourse while you have the infection.  Do not douche or wear tampons.  Discuss your infection with your partner, as your partner may have acquired the infection from you. Or, your partner may have been the person who transmitted the infection to you.  Have your sex partner examined and treated if necessary.  Practice safe, informed, and protected sex.  See your caregiver for other STD testing. SEEK MEDICAL CARE IF:   You still have symptoms after you finish the medication.  You have an oral temperature above 102 F (38.9 C).  You develop belly (abdominal) pain.  You have pain when you urinate.  You have bleeding after sexual intercourse.  You develop a rash.  The medication makes you sick or makes you throw up (vomit). Document Released: 12/10/2000 Document Revised: 09/08/2011 Document Reviewed: 01/05/2009 [facility] Patient Information 2014 [place], [place].

## 2013-02-02 DIAGNOSIS — A599 Trichomoniasis, unspecified: Secondary | ICD-10-CM | POA: Insufficient documentation

## 2013-02-02 NOTE — Assessment & Plan Note (Signed)
Performed Pap smear. Patient still need mammogram.

## 2013-02-02 NOTE — Progress Notes (Signed)
  Subjective:    Patient ID: Kristen Garrett, female    DOB: [birth year], 52 y.o.   MRN: [id number]  HPI  52 year old F who presents for a pap smear and evaluation of vaginal discharge.   Cervical Cancer Screening: She has not had a pap smear in "years." No history of abnormal pap. No vaginal bleeding  VAGINAL DISCHARGE Onset - several months Course - comes and goes, mildly improved with monistat but never cured Appearance - white, thick Smell - No Denies bleeding, dyspareunia, dysuria, abdominal pain Sexually active with husband only   Past Surgical History  Procedure Laterality Date  . Tubal ligation    . Endometrial ablation w/ novasure        Review of Systems See HPI    Objective:   Physical Exam  BP 120/78  Pulse 91  Ht 5' 6.5" (1.689 m)  Wt 175 lb (79.379 kg)  BMI 27.83 kg/m2 Gen: mildly distressed but non ill appearing  GU: External: no lesions Vagina: no blood in vault Cervix: no lesion; white, thin cervical discharge, no CMT Uterus: small, mobile Adnexa: no masses; non tender   Microscopic wet-mount exam shows trichomonads.      Assessment & Plan:  52 year old F with trichomoniasis.

## 2013-02-02 NOTE — Assessment & Plan Note (Signed)
NOT A DVT.

## 2013-02-02 NOTE — Assessment & Plan Note (Signed)
Treat with flagyl. Instructed her to have husband tested.

## 2013-02-04 ENCOUNTER — Other Ambulatory Visit: Payer: 59

## 2013-02-07 ENCOUNTER — Encounter: Payer: Self-pay | Admitting: Family Medicine

## 2013-02-08 ENCOUNTER — Other Ambulatory Visit: Payer: 59

## 2013-02-14 ENCOUNTER — Ambulatory Visit (INDEPENDENT_AMBULATORY_CARE_PROVIDER_SITE_OTHER): Payer: 59 | Admitting: Family Medicine

## 2013-02-14 ENCOUNTER — Encounter: Payer: Self-pay | Admitting: Family Medicine

## 2013-02-14 VITALS — BP 126/80 | HR 84 | Ht 66.5 in | Wt 184.0 lb

## 2013-02-14 DIAGNOSIS — A599 Trichomoniasis, unspecified: Secondary | ICD-10-CM

## 2013-02-14 DIAGNOSIS — I872 Venous insufficiency (chronic) (peripheral): Secondary | ICD-10-CM

## 2013-02-14 NOTE — Assessment & Plan Note (Signed)
52 year old female with bilateral leg pain and swelling also likely secondary to venous insufficiency, recent lab work was negative for renal or hepatic cause, no signs or symptoms of cardiac disease, patient did have recent venous Doppler that was negative for DVT  -Patient was counseled to wear compression stockings while at work -If symptoms not improved could consider addition of her HCTZ

## 2013-02-14 NOTE — Assessment & Plan Note (Signed)
Patient is asymptomatic status post treatment with Flagyl. No additional treatment prescribed today.

## 2013-02-14 NOTE — Progress Notes (Signed)
  Subjective:    Patient ID: Kristen Garrett, female    DOB: [birth year], 52 y.o.   MRN: [id number]  HPI 52 year old female presents for followup of recent lab work. She was diagnosed with trichomonas and treated with course of Flagyl. She has been asymptomatic and denies vaginal discharge. Her husband was also treated with course of Flagyl. Pap smear was obtained and was negative for intraepithelial lesion, high-risk HPV was negative  Patient has also had bilateral lower extremity pain off-and-on for the past few years, she had recent evaluation in the ER which included bilateral lower extremity Dopplers which were negative, she states the pain is worse at the end of the day and is associated with some lower extremity swelling, she is relatively pain-free in the morning, the pain is worse in the calves however does radiate to bilateral feet, she has minimal swelling in the morning, she works as a Teacher, English as a foreign language and her swelling worsens throughout the day, she is able to lay flat at night, denies orthopnea, denies paroxysmal nocturnal dyspnea   Review of Systems  Constitutional: Negative for fever, chills and unexpected weight change.  Respiratory: Negative for apnea, cough and chest tightness.   Cardiovascular: Negative for chest pain.  Musculoskeletal: Positive for arthralgias.  Skin: Negative for pallor and rash.       Objective:   Physical Exam Vitals: Reviewed General: Pleasant female in no acute distress Cardiac: Regular rate and rhythm, S1 and S2, no murmurs, no gallops, no heaves or thrills  Respiratory: Clear to auscultation bilaterally, good effort  Extremities: Bilateral lower extremity tenderness to palpation, no palpable cords Vasculature: 2+ dorsalis pedis and posterior tibialis pulses, trace bilateral lower extremity edema       Assessment & Plan:  Please see problem specific assessment and plan

## 2013-02-14 NOTE — Patient Instructions (Addendum)
You are clear of the infection. You do not need to return to the office for this unless you have symptoms.  Please purchase compression stockings to wear when you are on your feet.

## 2013-03-03 ENCOUNTER — Other Ambulatory Visit: Payer: Self-pay | Admitting: Family Medicine

## 2013-03-04 ENCOUNTER — Ambulatory Visit: Payer: 59 | Admitting: Family Medicine

## 2013-03-07 ENCOUNTER — Other Ambulatory Visit: Payer: Self-pay | Admitting: Family Medicine

## 2013-03-16 ENCOUNTER — Ambulatory Visit: Payer: 59 | Admitting: Family Medicine

## 2013-03-18 ENCOUNTER — Encounter: Payer: Self-pay | Admitting: Family Medicine

## 2013-03-18 ENCOUNTER — Ambulatory Visit (INDEPENDENT_AMBULATORY_CARE_PROVIDER_SITE_OTHER): Payer: 59 | Admitting: Family Medicine

## 2013-03-18 VITALS — BP 143/84 | HR 78 | Temp 98.3°F | Ht 66.5 in | Wt 183.0 lb

## 2013-03-18 DIAGNOSIS — B349 Viral infection, unspecified: Secondary | ICD-10-CM

## 2013-03-18 DIAGNOSIS — B9789 Other viral agents as the cause of diseases classified elsewhere: Secondary | ICD-10-CM

## 2013-03-18 DIAGNOSIS — M791 Myalgia, unspecified site: Secondary | ICD-10-CM

## 2013-03-18 DIAGNOSIS — IMO0001 Reserved for inherently not codable concepts without codable children: Secondary | ICD-10-CM

## 2013-03-18 MED ORDER — OSELTAMIVIR PHOSPHATE 75 MG PO CAPS: 75.0000 mg | ORAL_CAPSULE | Freq: Every day | ORAL | Status: AC

## 2013-03-18 NOTE — Progress Notes (Signed)
  Subjective:    Patient ID: Neema Barreira, female    DOB: [birth year], 52 y.o.   MRN: [id number]  HPI  52 year old F with coughing, muscle aches, fatigue, chills, and headache. This started suddenly 1 day ago. She does not know if she has a fever, because she have been taking tylenol daily. Additionally, she denies nausea, vomiting and diarrhea. She does have decreased PO intake, but can tolerate liquids. No sick contacts at home. Pt works in [employer] and notes one of the residents has been sick recently.   No flu vaccine at work.   Review of Systems See HPI    Objective:   Physical Exam BP 143/84  Pulse 78  Temp(Src) 98.3 F (36.8 C) (Oral)  Ht 5' 6.5" (1.689 m)  Wt 183 lb (83.008 kg)  BMI 29.1 kg/m2  Gen: middle aged AAF, ill appearing but non toxic Face: equivocal sinus tenderness Throat: mucous membranes moist, no exudate  Neck: shotty submental and submandibular lymphadenopathy, marked thyromegaly without nodules or tenderness (which patient says is unchanged from baseline) CV: RRR Pulm: CTA-B Muscle: general tenderness without joint effusions     Assessment & Plan:

## 2013-03-18 NOTE — Patient Instructions (Signed)
Dear Kristen Garrett,   I am concerned that you may have the flu. Therefore we will start a medication called Tamiflu to take once daily for 5 days. I also want to take Tylenol as needed. He needs to stay well-hydrated and stay away from the elderly and small children. He may return to work which her symptoms have improved for 24 hours.  Please return for followup in one to 2 weeks.   Sincerely, Dr. Clinton Sawyer

## 2013-03-19 DIAGNOSIS — B349 Viral infection, unspecified: Secondary | ICD-10-CM | POA: Insufficient documentation

## 2013-03-19 NOTE — Assessment & Plan Note (Signed)
Assessment: given diffuse symptoms without clear nidus and sudden onset, presumed viral infection Plan: treat with Tamiflu to see if this reduces symptoms mildly, otherwise supportive care

## 2013-03-28 ENCOUNTER — Telehealth: Payer: Self-pay | Admitting: Family Medicine

## 2013-03-28 NOTE — Telephone Encounter (Signed)
Will fwd. To Dr.Williamson for review. [name], [name]

## 2013-03-28 NOTE — Telephone Encounter (Signed)
Pt needs letter stating she is clear to return to work Please advise

## 2013-03-29 NOTE — Telephone Encounter (Signed)
Please see message: pt wants to go back to work. Thanks. [name], [name]

## 2013-03-29 NOTE — Telephone Encounter (Signed)
If she needed to stay out of work longer than the time that I specified in my previous letter, then she needs a follow up appointment with me or another physician.

## 2013-03-30 ENCOUNTER — Encounter: Payer: Self-pay | Admitting: Family Medicine

## 2013-03-30 ENCOUNTER — Ambulatory Visit (INDEPENDENT_AMBULATORY_CARE_PROVIDER_SITE_OTHER): Payer: 59 | Admitting: Family Medicine

## 2013-03-30 VITALS — BP 119/74 | HR 82 | Temp 98.7°F | Ht 66.5 in | Wt 178.0 lb

## 2013-03-30 DIAGNOSIS — G44209 Tension-type headache, unspecified, not intractable: Secondary | ICD-10-CM

## 2013-03-30 DIAGNOSIS — B9789 Other viral agents as the cause of diseases classified elsewhere: Secondary | ICD-10-CM

## 2013-03-30 DIAGNOSIS — B349 Viral infection, unspecified: Secondary | ICD-10-CM

## 2013-03-30 MED ORDER — KETOROLAC TROMETHAMINE 60 MG/2ML IM SOLN: 60.0000 mg | Freq: Once | INTRAMUSCULAR | Status: DC

## 2013-03-30 NOTE — Assessment & Plan Note (Signed)
Pt's Sx have improved immensely.  Will clear for return to work, and recommend influenza vaccine.

## 2013-03-30 NOTE — Telephone Encounter (Signed)
Letter completed for patient to return to work. She may pick it up.

## 2013-03-30 NOTE — Assessment & Plan Note (Deleted)
Pt with very classic signs of tension HA, no focal deficits.  Will try toradol 60 mg IM x 1, reassess and then have pt take Advil or aleve scheduled for the next couple days.  Red flags to f/u include focal deficit, worst HA of life, blurred vision, or intractable HA after a 2-3 days of compliant tx.

## 2013-03-30 NOTE — Patient Instructions (Addendum)

## 2013-03-30 NOTE — Telephone Encounter (Signed)
LMVM that letter up front for pick up.  Bethania Schlotzhauer, Darlyne Russian, CMA

## 2013-03-30 NOTE — Progress Notes (Signed)
Kristen Garrett is a 52 y.o. female who presents today for f/u for influenza like Sx.  Pt originally with productive yellow cough, myalgias, chills, subjective fevers.  Originially seen on 03/18/13 and has made great improvement over the last three days.  Decrease in cough, decrease in sputum production and has changed from yellow to clear, no fever, no chills.    Current Outpatient Prescriptions on File Prior to Visit  Medication Sig Dispense Refill  . aspirin 81 MG chewable tablet Chew 81 mg by mouth daily.        Marland Kitchen atorvastatin (LIPITOR) 40 MG tablet Take 1 tablet (40 mg total) by mouth daily.  30 tablet  11  . glyBURIDE (DIABETA) 5 MG tablet Take 2 tablets (10 mg total) by mouth daily with breakfast.  60 tablet  11  . ibuprofen (ADVIL,MOTRIN) 800 MG tablet Take 1 tablet (800 mg total) by mouth every 8 (eight) hours as needed for pain.  60 tablet  0  . lisinopril (PRINIVIL,ZESTRIL) 20 MG tablet Take 2 tablets (40 mg total) by mouth daily.  180 tablet  3  . metFORMIN (GLUCOPHAGE) 1000 MG tablet TAKE ONE TABLET BY MOUTH TWICE DAILY WITH MEALS *NEED APPOINTMENT FOR FURTHER REFILLS*  60 tablet  0  . oxymetazoline (AFRIN) 0.05 % nasal spray Place 2 sprays into the nose 2 (two) times daily as needed. For congestion       No current facility-administered medications on file prior to visit.    ROS: Per HPI.  All other systems reviewed and are negative.   Physical Exam Filed Vitals:   03/30/13 0859  BP: 119/74  Pulse: 82  Temp: 98.7 F (37.1 C)    Physical Examination: General appearance - alert, well appearing, and in no distress Neck - supple, no significant adenopathy Chest - clear to auscultation, no wheezes, rales or rhonchi, symmetric air entry Heart - normal rate and regular rhythm, no murmurs noted

## 2013-04-05 ENCOUNTER — Ambulatory Visit: Payer: 59 | Admitting: Family Medicine

## 2013-06-14 ENCOUNTER — Ambulatory Visit: Payer: 59 | Admitting: Family Medicine

## 2013-06-20 ENCOUNTER — Emergency Department (HOSPITAL_COMMUNITY)
Admission: EM | Admit: 2013-06-20 | Discharge: 2013-06-20 | Disposition: A | Discharge status: Home / Self Care | Payer: 59 | Attending: Emergency Medicine | Admitting: Emergency Medicine

## 2013-06-20 ENCOUNTER — Encounter (HOSPITAL_COMMUNITY): Payer: Self-pay | Admitting: Emergency Medicine

## 2013-06-20 DIAGNOSIS — E785 Hyperlipidemia, unspecified: Secondary | ICD-10-CM | POA: Insufficient documentation

## 2013-06-20 DIAGNOSIS — Z86718 Personal history of other venous thrombosis and embolism: Secondary | ICD-10-CM | POA: Insufficient documentation

## 2013-06-20 DIAGNOSIS — Z7982 Long term (current) use of aspirin: Secondary | ICD-10-CM | POA: Insufficient documentation

## 2013-06-20 DIAGNOSIS — R062 Wheezing: Secondary | ICD-10-CM | POA: Insufficient documentation

## 2013-06-20 DIAGNOSIS — E119 Type 2 diabetes mellitus without complications: Secondary | ICD-10-CM | POA: Insufficient documentation

## 2013-06-20 DIAGNOSIS — Z79899 Other long term (current) drug therapy: Secondary | ICD-10-CM | POA: Insufficient documentation

## 2013-06-20 DIAGNOSIS — F17200 Nicotine dependence, unspecified, uncomplicated: Secondary | ICD-10-CM | POA: Insufficient documentation

## 2013-06-20 DIAGNOSIS — J111 Influenza due to unidentified influenza virus with other respiratory manifestations: Secondary | ICD-10-CM | POA: Insufficient documentation

## 2013-06-20 MED ORDER — HYDROCOD POLST-CHLORPHEN POLST 10-8 MG/5ML PO LQCR: 5.0000 mL | Freq: Two times a day (BID) | ORAL | Status: DC | PRN

## 2013-06-20 NOTE — ED Provider Notes (Signed)
CSN: [id number]     Arrival date & time 06/20/13  0935 History   First MD Initiated Contact with Patient 06/20/13 1013     Chief Complaint  Patient presents with  . Nasal Congestion  . Cough  . Sore Throat   (Consider location/radiation/quality/duration/timing/severity/associated sxs/prior Treatment) HPI Comments: Patient's initiated Q. the onset of sore throat, congestion, and sinus pressure, cough, body aches, and fever, 3-4, days, ago.  Most of her coworkers with same.  She has tried rest and decongestant without any relief she states the cough is the most concerning of her symptoms, as it keeps her awake at night  Patient is a 52 y.o. female presenting with cough and pharyngitis. The history is provided by the patient.  Cough Cough characteristics:  Non-productive Severity:  Moderate Onset quality:  Sudden Duration:  4 days Timing:  Intermittent Progression:  Unchanged Chronicity:  New Context: sick contacts   Relieved by:  Nothing Worsened by:  Nothing tried Ineffective treatments:  Rest Associated symptoms: chills, fever, headaches, myalgias, sinus congestion and sore throat   Associated symptoms: no shortness of breath and no wheezing   Sore Throat Associated symptoms include chills, congestion, coughing, a fever, headaches, myalgias and a sore throat. Pertinent negatives include no nausea or vomiting.    Past Medical History  Diagnosis Date  . Diabetes mellitus   . Hyperlipidemia   . Hyperlipidemia   . DVT of lower limb, acute Age 41    Went on blood thinner for a few months   Past Surgical History  Procedure Laterality Date  . Tubal ligation    . Endometrial ablation w/ novasure     Family History  Problem Relation Age of Onset  . Diabetes Mother   . Hypertension Mother   . Hyperlipidemia Mother   . Diabetes Father   . Hyperlipidemia Father    History  Substance Use Topics  . Smoking status: Current Every Day Smoker -- 0.50 packs/day  . Smokeless  tobacco: Not on file     Comment: decreased smoking  . Alcohol Use: No   OB History   Grav Para Term Preterm Abortions TAB SAB Ect Mult Living   3 2 1 1      2      Review of Systems  Constitutional: Positive for fever and chills.  HENT: Positive for congestion, sinus pressure and sore throat.   Respiratory: Positive for cough. Negative for shortness of breath and wheezing.   Gastrointestinal: Negative for nausea and vomiting.  Musculoskeletal: Positive for myalgias.  Neurological: Positive for headaches.  All other systems reviewed and are negative.    Allergies  Review of patient's allergies indicates no known allergies.  Home Medications   Current Outpatient Rx  Name  Route  Sig  Dispense  Refill  . aspirin 81 MG chewable tablet   Oral   Chew 81 mg by mouth daily.           Marland Kitchen atorvastatin (LIPITOR) 40 MG tablet   Oral   Take 1 tablet (40 mg total) by mouth daily.   30 tablet   11   . glyBURIDE (DIABETA) 5 MG tablet   Oral   Take 2 tablets (10 mg total) by mouth daily with breakfast.   60 tablet   11   . ibuprofen (ADVIL,MOTRIN) 200 MG tablet   Oral   Take 600 mg by mouth every 6 (six) hours as needed.         Marland Kitchen lisinopril (PRINIVIL,ZESTRIL) 20  MG tablet   Oral   Take 2 tablets (40 mg total) by mouth daily.   180 tablet   3   . metFORMIN (GLUCOPHAGE) 1000 MG tablet      TAKE ONE TABLET BY MOUTH TWICE DAILY WITH MEALS *NEED APPOINTMENT FOR FURTHER REFILLS*   60 tablet   0   . oxymetazoline (AFRIN) 0.05 % nasal spray   Nasal   Place 2 sprays into the nose 2 (two) times daily as needed. For congestion         . chlorpheniramine-HYDROcodone (TUSSIONEX PENNKINETIC ER) 10-8 MG/5ML LQCR   Oral   Take 5 mLs by mouth every 12 (twelve) hours as needed for cough.   120 mL   0    BP 133/93  Pulse 96  Temp(Src) 98.2 F (36.8 C) (Oral)  Resp 20  Ht 5' 6.5" (1.689 m)  Wt 180 lb 4 oz (81.761 kg)  BMI 28.66 kg/m2  SpO2 99%  LMP 06/16/2013 Physical  Exam  Nursing note and vitals reviewed. Constitutional: She appears well-developed and well-nourished. No distress.  HENT:  Right Ear: External ear normal.  Left Ear: External ear normal.  Mouth/Throat: Oropharynx is clear and moist.  Eyes: Pupils are equal, round, and reactive to light.  Neck: Normal range of motion.  Cardiovascular: Normal rate.   Pulmonary/Chest: Effort normal. She has wheezes.  Nonproductive cough during examination  Abdominal: Soft.  Musculoskeletal: Normal range of motion.  Lymphadenopathy:    She has no cervical adenopathy.  Neurological: She is alert.  Skin: Skin is warm. No rash noted.    ED Course  Procedures (including critical care time) Labs Review Labs Reviewed - No data to display Imaging Review No results found.  EKG Interpretation   None       MDM   1. Influenza     Vision has been advised to take alternating doses of Tylenol or ibuprofen for fever or body aches.  She has been given a prescription for Tussionex to help control the cough.  She did take it out of work until next week to allow for rest.    Arman Filter, NP 06/20/13 1042

## 2013-06-20 NOTE — ED Provider Notes (Deleted)
CSN: [id number]     Arrival date & time 06/20/13  0935 History   First MD Initiated Contact with Patient 06/20/13 1013     Chief Complaint  Patient presents with  . Nasal Congestion  . Cough  . Sore Throat   (Consider location/radiation/quality/duration/timing/severity/associated sxs/prior Treatment) HPI  Past Medical History  Diagnosis Date  . Diabetes mellitus   . Hyperlipidemia   . Hyperlipidemia   . DVT of lower limb, acute Age 52    Went on blood thinner for a few months   Past Surgical History  Procedure Laterality Date  . Tubal ligation    . Endometrial ablation w/ novasure     Family History  Problem Relation Age of Onset  . Diabetes Mother   . Hypertension Mother   . Hyperlipidemia Mother   . Diabetes Father   . Hyperlipidemia Father    History  Substance Use Topics  . Smoking status: Current Every Day Smoker -- 0.50 packs/day  . Smokeless tobacco: Not on file     Comment: decreased smoking  . Alcohol Use: No   OB History   Grav Para Term Preterm Abortions TAB SAB Ect Mult Living   3 2 1 1      2      Review of Systems  Allergies  Review of patient's allergies indicates no known allergies.  Home Medications   Current Outpatient Rx  Name  Route  Sig  Dispense  Refill  . aspirin 81 MG chewable tablet   Oral   Chew 81 mg by mouth daily.           Marland Kitchen atorvastatin (LIPITOR) 40 MG tablet   Oral   Take 1 tablet (40 mg total) by mouth daily.   30 tablet   11   . glyBURIDE (DIABETA) 5 MG tablet   Oral   Take 2 tablets (10 mg total) by mouth daily with breakfast.   60 tablet   11   . ibuprofen (ADVIL,MOTRIN) 200 MG tablet   Oral   Take 600 mg by mouth every 6 (six) hours as needed.         Marland Kitchen lisinopril (PRINIVIL,ZESTRIL) 20 MG tablet   Oral   Take 2 tablets (40 mg total) by mouth daily.   180 tablet   3   . metFORMIN (GLUCOPHAGE) 1000 MG tablet      TAKE ONE TABLET BY MOUTH TWICE DAILY WITH MEALS *NEED APPOINTMENT FOR FURTHER  REFILLS*   60 tablet   0   . oxymetazoline (AFRIN) 0.05 % nasal spray   Nasal   Place 2 sprays into the nose 2 (two) times daily as needed. For congestion         . chlorpheniramine-HYDROcodone (TUSSIONEX PENNKINETIC ER) 10-8 MG/5ML LQCR   Oral   Take 5 mLs by mouth every 12 (twelve) hours as needed for cough.   120 mL   0    BP 133/93  Pulse 96  Temp(Src) 98.2 F (36.8 C) (Oral)  Resp 20  Ht 5' 6.5" (1.689 m)  Wt 180 lb 4 oz (81.761 kg)  BMI 28.66 kg/m2  SpO2 99%  LMP 06/16/2013 Physical Exam  ED Course  Procedures (including critical care time) Labs Review Labs Reviewed - No data to display Imaging Review No results found.  EKG Interpretation   None       MDM   1. Influenza        Arman Filter, NP 06/20/13 1059

## 2013-06-20 NOTE — ED Provider Notes (Signed)
Medical screening examination/treatment/procedure(s) were performed by non-physician practitioner and as supervising physician I was immediately available for consultation/collaboration.  EKG Interpretation   None         Karne Ozga F Demba Nigh, MD 06/20/13 1932 

## 2013-06-20 NOTE — ED Notes (Signed)
Pt c/o nasal congestion, sinus pain, cough, and sore throat x 4 days.  Pain score 8/10.  Congested cough noted.

## 2013-07-27 ENCOUNTER — Encounter: Payer: Self-pay | Admitting: Family Medicine

## 2013-07-27 ENCOUNTER — Ambulatory Visit (INDEPENDENT_AMBULATORY_CARE_PROVIDER_SITE_OTHER): Payer: 59 | Admitting: Family Medicine

## 2013-07-27 VITALS — BP 120/78 | HR 93 | Temp 98.4°F | Resp 18 | Wt 188.0 lb

## 2013-07-27 DIAGNOSIS — M79662 Pain in left lower leg: Secondary | ICD-10-CM

## 2013-07-27 DIAGNOSIS — E1165 Type 2 diabetes mellitus with hyperglycemia: Secondary | ICD-10-CM

## 2013-07-27 DIAGNOSIS — M79609 Pain in unspecified limb: Secondary | ICD-10-CM

## 2013-07-27 DIAGNOSIS — M79661 Pain in right lower leg: Secondary | ICD-10-CM

## 2013-07-27 DIAGNOSIS — E1142 Type 2 diabetes mellitus with diabetic polyneuropathy: Secondary | ICD-10-CM

## 2013-07-27 DIAGNOSIS — IMO0002 Reserved for concepts with insufficient information to code with codable children: Secondary | ICD-10-CM

## 2013-07-27 DIAGNOSIS — E1149 Type 2 diabetes mellitus with other diabetic neurological complication: Secondary | ICD-10-CM

## 2013-07-27 DIAGNOSIS — IMO0001 Reserved for inherently not codable concepts without codable children: Secondary | ICD-10-CM

## 2013-07-27 LAB — POCT GLYCOSYLATED HEMOGLOBIN (HGB A1C): Hemoglobin A1C: 10.2

## 2013-07-27 MED ORDER — GABAPENTIN 300 MG PO CAPS
300.0000 mg | ORAL_CAPSULE | Freq: Three times a day (TID) | ORAL | Status: DC
Start: 2013-07-27 — End: 2014-02-03

## 2013-07-27 MED ORDER — METFORMIN HCL 500 MG PO TABS: 500.0000 mg | ORAL_TABLET | Freq: Two times a day (BID) | ORAL | Status: DC

## 2013-07-27 MED ORDER — IBUPROFEN 200 MG PO TABS: 600.0000 mg | ORAL_TABLET | Freq: Four times a day (QID) | ORAL | Status: DC | PRN

## 2013-07-27 NOTE — Patient Instructions (Signed)
Mrs. Christell ConstantMoore,   It was great to see you. Please read below.   1. Labs - I am very sorry that we could not get these today. Please schedule a time to come back in the next week just to the lab, and they will draw your blood.   2. Diabetes Medications - Please starting taking metformin 500 mg BID and glyburide 10 mg with breakfast and dinner.   3. Meals - You should get a book about diabetic diets, b/c it is very important to improve your eating habits.   4. Foot pain - Please start Neurontin 300 mg each night and increase as needed to 300 mg 3 times per day.   5. Eye Exam - Please let me know the results of your exam.   Come back in 3 months or sooner if needed.   Sincerely,   Dr. Clinton SawyerWilliamson

## 2013-07-27 NOTE — Assessment & Plan Note (Addendum)
A: pt with hgb A1C 10.2 unfortunately due to multiple factors including non compliance with meds, no specific diet, and no regular exercise P:  - Coached on taking meds regularly, using metformin 500 mg BID (> 500 mg causes diarrhea) and glyburide 10 mg BID - Encouraged reguar meals each day and reading about diabetic diet - Pt needs DM eye exam

## 2013-07-27 NOTE — Assessment & Plan Note (Signed)
A: small fiber, since sensation still intact P: start neurontin for symptomatic control, titrate as needed

## 2013-07-27 NOTE — Progress Notes (Signed)
Patient ID: Kristen Garrett, female   DOB: [birth year], 53 y.o.   MRN: [id number]   Subjective:   Diabetes  Recent Issues:  Hypoglycemia: No  Medication Compliance: Diabetes medication: No - intermittently taking glyburide  Taking ACE-I: Yes Taking statin: Yes  Behavioral: Home CBG Monitoring: No Diet changes: No Exercise: No  Health Maintenance: Visual problems: No Last eye exam: uknown Last dental visit: unknown Foot ulcers: No, but having lots of pain in her feet bilaterally, described as burning and cold, worse at night, midfoot distally  Current Outpatient Prescriptions on File Prior to Visit  Medication Sig Dispense Refill  . aspirin 81 MG chewable tablet Chew 81 mg by mouth daily.        Marland Kitchen. atorvastatin (LIPITOR) 40 MG tablet Take 1 tablet (40 mg total) by mouth daily.  30 tablet  11  . chlorpheniramine-HYDROcodone (TUSSIONEX PENNKINETIC ER) 10-8 MG/5ML LQCR Take 5 mLs by mouth every 12 (twelve) hours as needed for cough.  120 mL  0  . glyBURIDE (DIABETA) 5 MG tablet Take 2 tablets (10 mg total) by mouth daily with breakfast.  60 tablet  11  . ibuprofen (ADVIL,MOTRIN) 200 MG tablet Take 600 mg by mouth every 6 (six) hours as needed.      Marland Kitchen. lisinopril (PRINIVIL,ZESTRIL) 20 MG tablet Take 2 tablets (40 mg total) by mouth daily.  180 tablet  3  . metFORMIN (GLUCOPHAGE) 1000 MG tablet TAKE ONE TABLET BY MOUTH TWICE DAILY WITH MEALS *NEED APPOINTMENT FOR FURTHER REFILLS*  60 tablet  0  . oxymetazoline (AFRIN) 0.05 % nasal spray Place 2 sprays into the nose 2 (two) times daily as needed. For congestion       No current facility-administered medications on file prior to visit.     Review of Systems:  Musculoskeletal:positive for myalgias     Objective:   Physical Exam: BP 120/78  Pulse 93  Temp(Src) 98.4 F (36.9 C) (Oral)  Resp 18  Wt 188 lb (85.276 kg)  SpO2 100%  LMP 06/12/2013  General: alert, well appearing, and in no distress, obese Eyes: fundoscopic exam  normal Cardiovascular: RRR, nl S1 and S2 Pulmonary: clear to auscultation bilaterally Feet: warm, good capillary refill, blanching with elevation noted and normal monofilament exam  Labs:   Diabetic Labs:  Lab Results  Component Value Date   HGBA1C 10.2 07/27/2013   HGBA1C 7.8 01/12/2013   HGBA1C 9.4 09/22/2012   Lab Results  Component Value Date   LDLCALC 136* 09/22/2012   CREATININE 0.64 01/12/2013   Last microalbumin: No results found for this basename: MICROALBUR,  [id number]        Assessment & Plan:

## 2013-07-27 NOTE — Assessment & Plan Note (Signed)
A: persistent, no evidence of swelling, r/o DVT in July 2014 P: refill ibuprofen PRN

## 2013-11-14 ENCOUNTER — Other Ambulatory Visit: Payer: Self-pay | Admitting: Family Medicine

## 2014-01-27 ENCOUNTER — Ambulatory Visit: Payer: 59

## 2014-02-03 ENCOUNTER — Telehealth: Payer: Self-pay | Admitting: Family Medicine

## 2014-02-03 ENCOUNTER — Other Ambulatory Visit: Payer: Self-pay | Admitting: *Deleted

## 2014-02-03 DIAGNOSIS — E1142 Type 2 diabetes mellitus with diabetic polyneuropathy: Secondary | ICD-10-CM

## 2014-02-03 MED ORDER — GABAPENTIN 300 MG PO CAPS: 300.0000 mg | ORAL_CAPSULE | Freq: Three times a day (TID) | ORAL | Status: DC

## 2014-02-03 NOTE — Telephone Encounter (Signed)
Pt informed of refill being sent in. Sharronda Schweers CMA

## 2014-02-03 NOTE — Telephone Encounter (Signed)
Patient requesting refill on her Gabapentin.  Out of medication completely.  Please send into [facility] on [address] today if possible.

## 2014-02-03 NOTE — Telephone Encounter (Signed)
Refilled patient's gabapentin.

## 2014-02-23 ENCOUNTER — Other Ambulatory Visit: Payer: Self-pay | Admitting: *Deleted

## 2014-02-23 MED ORDER — LISINOPRIL 20 MG PO TABS: ORAL_TABLET | ORAL | Status: DC

## 2014-02-23 NOTE — Telephone Encounter (Signed)
I have successfully refilled patient's prescription for lisinopril. This medication prescription should be waiting for them at their listed pharmacy. Please call to inform them of this refill and to set up an appointment to meet her new PCP  Thank you!

## 2014-02-23 NOTE — Telephone Encounter (Signed)
Pt informed of medication refill.  Appointment 03/07/2014 at 3 PM.  Clovis Pu, RN

## 2014-02-24 ENCOUNTER — Other Ambulatory Visit: Payer: Self-pay | Admitting: *Deleted

## 2014-02-24 DIAGNOSIS — E1165 Type 2 diabetes mellitus with hyperglycemia: Secondary | ICD-10-CM

## 2014-02-24 DIAGNOSIS — IMO0002 Reserved for concepts with insufficient information to code with codable children: Secondary | ICD-10-CM

## 2014-02-24 MED ORDER — GLYBURIDE 5 MG PO TABS: 10.0000 mg | ORAL_TABLET | Freq: Every day | ORAL | Status: DC

## 2014-02-24 NOTE — Telephone Encounter (Signed)
I have successfully refilled patient's prescription for Diabeta. This medication prescription should be waiting for them at their listed pharmacy.  I look forward to meeting her at her appointment in 2 weeks.   Thank you!

## 2014-02-27 ENCOUNTER — Other Ambulatory Visit: Payer: Self-pay | Admitting: *Deleted

## 2014-02-28 MED ORDER — LISINOPRIL 20 MG PO TABS
ORAL_TABLET | ORAL | Status: DC
Start: 2014-02-28 — End: 2014-04-13

## 2014-03-07 ENCOUNTER — Ambulatory Visit: Payer: 59 | Admitting: Family Medicine

## 2014-03-26 ENCOUNTER — Emergency Department (INDEPENDENT_AMBULATORY_CARE_PROVIDER_SITE_OTHER)
Admission: EM | Admit: 2014-03-26 | Discharge: 2014-03-26 | Disposition: A | Discharge status: Home / Self Care | Payer: Self-pay | Source: Home / Self Care | Attending: Family Medicine | Admitting: Family Medicine

## 2014-03-26 ENCOUNTER — Encounter (HOSPITAL_COMMUNITY): Payer: Self-pay | Admitting: Emergency Medicine

## 2014-03-26 DIAGNOSIS — M5430 Sciatica, unspecified side: Secondary | ICD-10-CM

## 2014-03-26 DIAGNOSIS — M5442 Lumbago with sciatica, left side: Secondary | ICD-10-CM

## 2014-03-26 MED ORDER — HYDROCODONE-ACETAMINOPHEN 5-325 MG PO TABS
ORAL_TABLET | ORAL | Status: AC
  Filled 2014-03-26: qty 1

## 2014-03-26 MED ORDER — HYDROCODONE-ACETAMINOPHEN 5-325 MG PO TABS
1.0000 | ORAL_TABLET | Freq: Once | ORAL | Status: AC
  Administered 2014-03-26: 1 via ORAL

## 2014-03-26 MED ORDER — HYDROCODONE-ACETAMINOPHEN 5-325 MG PO TABS
1.0000 | ORAL_TABLET | Freq: Four times a day (QID) | ORAL | Status: DC | PRN
Start: 2014-03-26 — End: 2014-04-13

## 2014-03-26 NOTE — ED Notes (Signed)
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  No relief with otc pain meds.  Dx with sprained lumbar.  Taking muscle relaxer and naproxen.  Worked last night and spasms started again but more severe this time.

## 2014-03-26 NOTE — ED Provider Notes (Signed)
Medical screening examination/treatment/procedure(s) were performed by non-physician practitioner and as supervising physician I was immediately available for consultation/collaboration.  Leslee Home, M.D.  Reuben Likes, MD 03/26/14 [phone]

## 2014-03-26 NOTE — ED Provider Notes (Signed)
CSN: [id number]     Arrival date & time 03/26/14  1532 History   First MD Initiated Contact with Patient 03/26/14 1559     Chief Complaint  Patient presents with  . Back Pain   (Consider location/radiation/quality/duration/timing/severity/associated sxs/prior Treatment) HPI Comments: Patient presents today with ongoing lower back pain. 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 She did claim workers comp and was see by "their" physician. She was diagnosed with a strain and released to light duty. 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 At times her pain radiates to right leg and buttock. No tingling, loss of strength or loss of bowel of bladder control.  Pain worse with any ROM.   The history is provided by the patient.    Past Medical History  Diagnosis Date  . Diabetes mellitus   . Hyperlipidemia   . Hyperlipidemia   . DVT of lower limb, acute Age 53    Went on blood thinner for a few months   Past Surgical History  Procedure Laterality Date  . Tubal ligation    . Endometrial ablation w/ novasure     Family History  Problem Relation Age of Onset  . Diabetes Mother   . Hypertension Mother   . Hyperlipidemia Mother   . Diabetes Father   . Hyperlipidemia Father    History  Substance Use Topics  . Smoking status: Current Every Day Smoker -- 0.50 packs/day  . Smokeless tobacco: Not on file     Comment: decreased smoking  . Alcohol Use: No   OB History   Grav Para Term Preterm Abortions TAB SAB Ect Mult Living   Review of Systems  All other systems reviewed and are negative.   Allergies  Review of patient's allergies indicates no known allergies.  Home Medications   Prior to Admission medications   Medication Sig Start Date End Date Taking? Authorizing Provider  aspirin 81 MG chewable tablet Chew 81 mg by mouth daily.     Yes Historical  Provider, MD  atorvastatin (LIPITOR) 40 MG tablet Take 1 tablet (40 mg total) by mouth daily. 11/06/12  Yes Garnetta Buddy, MD  gabapentin (NEURONTIN) 300 MG capsule Take 1 capsule (300 mg total) by mouth 3 (three) times daily. 02/03/14  Yes Jacquiline Doe, MD  glyBURIDE (DIABETA) 5 MG tablet Take 2 tablets (10 mg total) by mouth daily with breakfast. 02/24/14  Yes Kathee Delton, MD  ibuprofen (ADVIL,MOTRIN) 200 MG tablet Take 3 tablets (600 mg total) by mouth every 6 (six) hours as needed. 07/27/13  Yes Garnetta Buddy, MD  lisinopril (PRINIVIL,ZESTRIL) 20 MG tablet TAKE TWO TABLETS BY MOUTH ONCE DAILY 02/28/14  Yes Kathee Delton, MD  metFORMIN (GLUCOPHAGE) 500 MG tablet Take 1 tablet (500 mg total) by mouth 2 (two) times daily with a meal. 07/27/13  Yes Garnetta Buddy, MD  HYDROcodone-acetaminophen (NORCO/VICODIN) 5-325 MG per tablet Take 1-2 tablets by mouth every 6 (six) hours as needed for moderate pain. 03/26/14   Riki Sheer, PA-C   BP 164/86  Pulse 83  Temp(Src) 98.1 F (36.7 C) (Oral)  Resp 16  SpO2 100%  LMP 02/25/2014 Physical Exam  Nursing note and vitals reviewed. Constitutional: She appears well-developed and well-nourished. She appears distressed.  HENT:  Head: Normocephalic and atraumatic.  Musculoskeletal:  Painful and decreased ROM in the lumbar spine. Pain with light palpation para spinal region. No definite spasm  Neurological: She is alert. She displays normal reflexes. She exhibits normal muscle tone. Coordination normal.  Skin: Skin is warm and dry. She is not diaphoretic.  Psychiatric: Her behavior is normal.    ED Course  Procedures (including critical care time) Labs Review Labs Reviewed - No data to display  Imaging Review No results found.   MDM   1. Low back pain with left-sided sciatica, unspecified back pain laterality    Add Norco for added relief. Refused Toradol injection here. Work note given; however ultimately will need f/u with   initial physician seen for this. May need Ortho referral. No neuro deficit. Gentle ROM and rest. Ice.  Work note given. F/U if worsens.     Riki Sheer, PA-C 03/26/14 1616

## 2014-03-26 NOTE — Discharge Instructions (Signed)
Back Pain, Adult Back pain is very common. The pain often gets better over time. The cause of back pain is usually not dangerous. Most people can learn to manage their back pain on their own.  HOME CARE   Stay active. Start with short walks on flat ground if you can. Try to walk farther each day.  Do not sit, drive, or stand in one place for more than 30 minutes. Do not stay in bed.  Do not avoid exercise or work. Activity can help your back heal faster.  Be careful when you bend or lift an object. Bend at your knees, keep the object close to you, and do not twist.  Sleep on a firm mattress. Lie on your side, and bend your knees. If you lie on your back, put a pillow under your knees.  Only take medicines as told by your doctor.  Put ice on the injured area.  Put ice in a plastic bag.  Place a towel between your skin and the bag.  Leave the ice on for 15-20 minutes, 03-04 times a day for the first 2 to 3 days. After that, you can switch between ice and heat packs.  Ask your doctor about back exercises or massage.  Avoid feeling anxious or stressed. Find good ways to deal with stress, such as exercise. GET HELP RIGHT AWAY IF:   Your pain does not go away with rest or medicine.  Your pain does not go away in 1 week.  You have new problems.  You do not feel well.  The pain spreads into your legs.  You cannot control when you poop (bowel movement) or pee (urinate).  Your arms or legs feel weak or lose feeling (numbness).  You feel sick to your stomach (nauseous) or throw up (vomit).  You have belly (abdominal) pain.  You feel like you may pass out (faint). MAKE SURE YOU:   Understand these instructions.  Will watch your condition.  Will get help right away if you are not doing well or get worse. Document Released: 12/03/2007 Document Revised: 09/08/2011 Document Reviewed: 10/18/2013 [facility] Patient Information 2015 [place], [place]. This information is not intended  to replace advice given to you by your health care provider. Make sure you discuss any questions you have with your health care provider.  Ice to area 20 min on and off, avoid sleeping on a couch or recliner, use a firm surface. Ok for work note, though will need to make your employer aware of this. Pain meds as needed. Continue use of Naproxyen and muscle relaxer as needed.

## 2014-03-26 NOTE — ED Notes (Signed)
Also reports shooting pain down right leg with standing.  Mw,cma

## 2014-04-13 ENCOUNTER — Emergency Department (HOSPITAL_COMMUNITY): Payer: 59

## 2014-04-13 ENCOUNTER — Encounter (HOSPITAL_COMMUNITY): Payer: Self-pay | Admitting: Emergency Medicine

## 2014-04-13 ENCOUNTER — Emergency Department (HOSPITAL_COMMUNITY)
Admission: EM | Admit: 2014-04-13 | Discharge: 2014-04-13 | Disposition: A | Discharge status: Home / Self Care | Payer: 59 | Attending: Emergency Medicine | Admitting: Emergency Medicine

## 2014-04-13 DIAGNOSIS — M5442 Lumbago with sciatica, left side: Secondary | ICD-10-CM | POA: Insufficient documentation

## 2014-04-13 DIAGNOSIS — Z86718 Personal history of other venous thrombosis and embolism: Secondary | ICD-10-CM | POA: Insufficient documentation

## 2014-04-13 DIAGNOSIS — Z7982 Long term (current) use of aspirin: Secondary | ICD-10-CM | POA: Insufficient documentation

## 2014-04-13 DIAGNOSIS — E119 Type 2 diabetes mellitus without complications: Secondary | ICD-10-CM | POA: Insufficient documentation

## 2014-04-13 DIAGNOSIS — Z79899 Other long term (current) drug therapy: Secondary | ICD-10-CM | POA: Insufficient documentation

## 2014-04-13 DIAGNOSIS — E785 Hyperlipidemia, unspecified: Secondary | ICD-10-CM | POA: Insufficient documentation

## 2014-04-13 MED ORDER — TRAMADOL HCL 50 MG PO TABS
50.0000 mg | ORAL_TABLET | Freq: Once | ORAL | Status: AC
  Administered 2014-04-13: 50 mg via ORAL
  Filled 2014-04-13: qty 1

## 2014-04-13 MED ORDER — TRAMADOL HCL 50 MG PO TABS: 50.0000 mg | ORAL_TABLET | Freq: Four times a day (QID) | ORAL | Status: DC | PRN

## 2014-04-13 NOTE — Discharge Instructions (Signed)
Back Pain, Adult Back pain is very common. The pain often gets better over time. The cause of back pain is usually not dangerous. Most people can learn to manage their back pain on their own.  HOME CARE   Stay active. Start with short walks on flat ground if you can. Try to walk farther each day.  Do not sit, drive, or stand in one place for more than 30 minutes. Do not stay in bed.  Do not avoid exercise or work. Activity can help your back heal faster.  Be careful when you bend or lift an object. Bend at your knees, keep the object close to you, and do not twist.  Sleep on a firm mattress. Lie on your side, and bend your knees. If you lie on your back, put a pillow under your knees.  Only take medicines as told by your doctor.  Put ice on the injured area.  Put ice in a plastic bag.  Place a towel between your skin and the bag.  Leave the ice on for 15-20 minutes, 03-04 times a day for the first 2 to 3 days. After that, you can switch between ice and heat packs.  Ask your doctor about back exercises or massage.  Avoid feeling anxious or stressed. Find good ways to deal with stress, such as exercise. GET HELP RIGHT AWAY IF:   Your pain does not go away with rest or medicine.  Your pain does not go away in 1 week.  You have new problems.  You do not feel well.  The pain spreads into your legs.  You cannot control when you poop (bowel movement) or pee (urinate).  Your arms or legs feel weak or lose feeling (numbness).  You feel sick to your stomach (nauseous) or throw up (vomit).  You have belly (abdominal) pain.  You feel like you may pass out (faint). MAKE SURE YOU:   Understand these instructions.  Will watch your condition.  Will get help right away if you are not doing well or get worse. Document Released: 12/03/2007 Document Revised: 09/08/2011 Document Reviewed: 10/18/2013 [facility] Patient Information 2015 [place]Care, [place]. This information is not intended  to replace advice given to you by your health care provider. Make sure you discuss any questions you have with your health care provider.  Back Exercises These exercises may help you when beginning to rehabilitate your injury. Your symptoms may resolve with or without further involvement from your physician, physical therapist or athletic trainer. While completing these exercises, remember:   Restoring tissue flexibility helps normal motion to return to the joints. This allows healthier, less painful movement and activity.  An effective stretch should be held for at least 30 seconds.  A stretch should never be painful. You should only feel a gentle lengthening or release in the stretched tissue. STRETCH - Extension, Prone on Elbows   Lie on your stomach on the floor, a bed will be too soft. Place your palms about shoulder width apart and at the height of your head.  Place your elbows under your shoulders. If this is too painful, stack pillows under your chest.  Allow your body to relax so that your hips drop lower and make contact more completely with the floor.  Hold this position for __________ seconds.  Slowly return to lying flat on the floor. Repeat __________ times. Complete this exercise __________ times per day.  RANGE OF MOTION - Extension, Prone Press Ups   Lie on your stomach  on the floor, a bed will be too soft. Place your palms about shoulder width apart and at the height of your head.  Keeping your back as relaxed as possible, slowly straighten your elbows while keeping your hips on the floor. You may adjust the placement of your hands to maximize your comfort. As you gain motion, your hands will come more underneath your shoulders.  Hold this position __________ seconds.  Slowly return to lying flat on the floor. Repeat __________ times. Complete this exercise __________ times per day.  RANGE OF MOTION- Quadruped, Neutral Spine   Assume a hands and knees position on a  firm surface. Keep your hands under your shoulders and your knees under your hips. You may place padding under your knees for comfort.  Drop your head and point your tail bone toward the ground below you. This will round out your low back like an angry cat. Hold this position for __________ seconds.  Slowly lift your head and release your tail bone so that your back sags into a large arch, like an old horse.  Hold this position for __________ seconds.  Repeat this until you feel limber in your low back.  Now, find your "sweet spot." This will be the most comfortable position somewhere between the two previous positions. This is your neutral spine. Once you have found this position, tense your stomach muscles to support your low back.  Hold this position for __________ seconds. Repeat __________ times. Complete this exercise __________ times per day.  STRETCH - Flexion, Single Knee to Chest   Lie on a firm bed or floor with both legs extended in front of you.  Keeping one leg in contact with the floor, bring your opposite knee to your chest. Hold your leg in place by either grabbing behind your thigh or at your knee.  Pull until you feel a gentle stretch in your low back. Hold __________ seconds.  Slowly release your grasp and repeat the exercise with the opposite side. Repeat __________ times. Complete this exercise __________ times per day.  STRETCH - Hamstrings, Standing  Stand or sit and extend your right / left leg, placing your foot on a chair or foot stool  Keeping a slight arch in your low back and your hips straight forward.  Lead with your chest and lean forward at the waist until you feel a gentle stretch in the back of your right / left knee or thigh. (When done correctly, this exercise requires leaning only a small distance.)  Hold this position for __________ seconds. Repeat __________ times. Complete this stretch __________ times per day. STRENGTHENING - Deep  Abdominals, Pelvic Tilt   Lie on a firm bed or floor. Keeping your legs in front of you, bend your knees so they are both pointed toward the ceiling and your feet are flat on the floor.  Tense your lower abdominal muscles to press your low back into the floor. This motion will rotate your pelvis so that your tail bone is scooping upwards rather than pointing at your feet or into the floor.  With a gentle tension and even breathing, hold this position for __________ seconds. Repeat __________ times. Complete this exercise __________ times per day.  STRENGTHENING - Abdominals, Crunches   Lie on a firm bed or floor. Keeping your legs in front of you, bend your knees so they are both pointed toward the ceiling and your feet are flat on the floor. Cross your arms over your chest.  Slightly tip your chin down without bending your neck. °· Tense your abdominals and slowly lift your trunk high enough to just clear your shoulder blades. Lifting higher can put excessive stress on the low back and does not further strengthen your abdominal muscles. °· Control your return to the starting position. °Repeat __________ times. Complete this exercise __________ times per day.  °STRENGTHENING - Quadruped, Opposite UE/LE Lift  °· Assume a hands and knees position on a firm surface. Keep your hands under your shoulders and your knees under your hips. You may place padding under your knees for comfort. °· Find your neutral spine and gently tense your abdominal muscles so that you can maintain this position. Your shoulders and hips should form a rectangle that is parallel with the floor and is not twisted. °· Keeping your trunk steady, lift your right hand no higher than your shoulder and then your left leg no higher than your hip. Make sure you are not holding your breath. Hold this position __________ seconds. °· Continuing to keep your abdominal muscles tense and your back steady, slowly return to your starting position.  Repeat with the opposite arm and leg. °Repeat __________ times. Complete this exercise __________ times per day. °Document Released: 07/04/2005 Document Revised: 09/08/2011 Document Reviewed: 09/28/2008 °ExitCare® Patient Information ©2015 [employer], LLC. This information is not intended to replace advice given to you by your health care provider. Make sure you discuss any questions you have with your health care provider. ° °

## 2014-04-13 NOTE — ED Notes (Signed)
Patient pain in lower back that radiates down into the left leg.  Patient states she can't work.   Patient states she was ordered vicodin previously, but "I thought my house was on fire".  Patient states doesn't take naproxyn like she should.

## 2014-04-13 NOTE — ED Provider Notes (Signed)
CSN: [id number]     Arrival date & time 04/13/14  0845 History  This chart was scribed for non-physician practitioner Kristen Garrett, working with Richardean Canalavid H Yao, MD by Carl Bestelina Holson, ED Scribe. This patient was seen in room TR08C/TR08C and the patient's care was started at 9:43 AM.    Chief Complaint  Patient presents with  . Back Pain   Patient is a 53 y.o. female presenting with back pain. The history is provided by the patient. No language interpreter was used.  Back Pain Associated symptoms: no fever    HPI Comments: Kristen Garrett is a 53 y.o. 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  Laying down alleviates the pain however she has trouble getting up.  She has taken Naproxen for her symptoms with no relief to her symptoms.  She was given Hydrocodone for her symptoms but stopped taking it after she started hallucinating.  She did not take the Vicodin as prescribed because she felt as though she was not examined properly.  She denies fever, chills, and incontinence as associated symptoms.  She does not have a history of drug use.   Past Medical History  Diagnosis Date  . Diabetes mellitus   . Hyperlipidemia   . Hyperlipidemia   . DVT of lower limb, acute Age 89    Went on blood thinner for a few months   Past Surgical History  Procedure Laterality Date  . Tubal ligation    . Endometrial ablation w/ novasure     Family History  Problem Relation Age of Onset  . Diabetes Mother   . Hypertension Mother   . Hyperlipidemia Mother   . Diabetes Father   . Hyperlipidemia Father    History  Substance Use Topics  . Smoking status: Current Every Day Smoker -- 0.50 packs/day  . Smokeless tobacco: Not on file     Comment: decreased smoking   . Alcohol Use: No   OB History   Grav Para Term Preterm Abortions TAB SAB Ect Mult Living   3 2 1 1      2      Review of Systems  Constitutional: Negative for fever and chills.  Musculoskeletal: Positive for arthralgias and back pain.  All other systems reviewed and are negative.     Allergies  Review of patient's allergies indicates no known allergies.  Home Medications   Prior to Admission medications   Medication Sig Start Date End Date Taking? Authorizing Provider  aspirin 81 MG chewable tablet Chew 81 mg by mouth daily.      Historical Provider, MD  atorvastatin (LIPITOR) 40 MG tablet Take 1 tablet (40 mg total) by mouth daily. 11/06/12   Garnetta BuddyEdward Williamson V, MD  gabapentin (NEURONTIN) 300 MG capsule Take 1 capsule (300 mg total) by mouth 3 (three) times daily. 02/03/14   Jacquiline Doealeb Parker, MD  glyBURIDE (DIABETA) 5 MG tablet Take 2 tablets (10 mg total) by mouth daily with breakfast. 02/24/14   Kathee DeltonIan D McKeag, MD  HYDROcodone-acetaminophen (NORCO/VICODIN) 5-325 MG per tablet Take 1-2 tablets by mouth every 6 (six) hours as needed for moderate pain. 03/26/14   Riki SheerMichelle G Young, PA-C  ibuprofen (ADVIL,MOTRIN) 200 MG tablet Take 3 tablets (600 mg total) by mouth every 6 (six) hours  as needed. 07/27/13   Garnetta BuddyEdward Williamson V, MD  lisinopril (PRINIVIL,ZESTRIL) 20 MG tablet TAKE TWO TABLETS BY MOUTH ONCE DAILY 02/28/14   Kathee DeltonIan D McKeag, MD  metFORMIN (GLUCOPHAGE) 500 MG tablet Take 1 tablet (500 mg total) by mouth 2 (two) times daily with a meal. 07/27/13   Garnetta BuddyEdward Williamson V, MD   BP 165/71  Pulse 68  Temp(Src) 97.4 F (36.3 C) (Oral)  Ht 5\' 6"  (1.676 m)  Wt 194 lb (87.998 kg)  BMI 31.33 kg/m2  SpO2 100%  LMP 02/25/2014 Physical Exam  Nursing note and vitals reviewed. Constitutional: She is oriented to person, place, and time. She appears well-developed and well-nourished. No distress.  HENT:  Head: Normocephalic and atraumatic.  Right Ear: External ear normal.  Left Ear: External ear  normal.  Nose: Nose normal.  Mouth/Throat: Oropharynx is clear and moist.  Eyes: Conjunctivae and EOM are normal.  Neck: Normal range of motion.  Cardiovascular: Normal rate, regular rhythm and normal heart sounds.   Pulses:      Posterior tibial pulses are 2+ on the right side, and 2+ on the left side.  Pulmonary/Chest: Effort normal and breath sounds normal. No stridor. No respiratory distress. She has no wheezes. She has no rales.  Abdominal: Soft. She exhibits no distension.  Musculoskeletal: Normal range of motion.  Tender to palpation to lumbar spine.  Neurological: She is alert and oriented to person, place, and time. She has normal strength.  Antalgic gait.  Strength 5/5 of lower extremities bilaterally.  Skin: Skin is warm and dry. No lesion and no rash noted. She is not diaphoretic. No erythema.  Psychiatric: She has a normal mood and affect. Her behavior is normal.    ED Course  Procedures (including critical care time)  DIAGNOSTIC STUDIES: Oxygen Saturation is 100% on room air, normal by my interpretation.    COORDINATION OF CARE: 9:47 AM- Discussed obtaining an x-ray of the patient's back and prescribing Tramadol.  Will discharge the patient with a note excusing her from work.  The patient agreed to the treatment plan.   Labs Review Labs Reviewed - No data to display  Imaging Review Dg Lumbar Spine Complete  04/13/2014   CLINICAL DATA:  Back injury  EXAM: LUMBAR SPINE - COMPLETE 4+ VIEW  COMPARISON:  02/12/2004  FINDINGS: No vertebral compression deformity. Stable alignment. L4-5 narrowing is severe with vacuum. This has progressed. Mild narrowing at L3-4 and L5-S1. No pars defect.  IMPRESSION: No acute bony pathology.  Degenerative change.   Electronically Signed   By: Maryclare BeanArt  Hoss M.D.   On: 04/13/2014 10:21     EKG Interpretation None      MDM   Final diagnoses:  Low back pain with left-sided sciatica, unspecified back pain laterality    Patient with back  pain.  No neurological deficits and normal neuro exam.  Patient can walk but states is painful.  No loss of bowel or bladder control.  No concern for cauda equina.  No fever, night sweats, weight loss, h/o cancer, IVDU.  RICE protocol and pain medicine indicated and discussed with patient.    I personally performed the services described in this documentation, which was scribed in my presence. The recorded information has been reviewed and is accurate.    Mora BellmanHannah S Marvette Schamp, PA-C 04/14/14 2008

## 2014-04-13 NOTE — ED Notes (Signed)
Declined W/C at D/C and was escorted to lobby by RN. 

## 2014-04-15 NOTE — ED Provider Notes (Signed)
Medical screening examination/treatment/procedure(s) were performed by non-physician practitioner and as supervising physician I was immediately available for consultation/collaboration.   EKG Interpretation None        Richardean Canalavid H Kyriakos Babler, MD 04/15/14 [phone]

## 2014-05-01 ENCOUNTER — Encounter (HOSPITAL_COMMUNITY): Payer: Self-pay | Admitting: Emergency Medicine

## 2014-05-18 ENCOUNTER — Ambulatory Visit (INDEPENDENT_AMBULATORY_CARE_PROVIDER_SITE_OTHER): Payer: 59 | Admitting: Family Medicine

## 2014-05-18 ENCOUNTER — Encounter: Payer: Self-pay | Admitting: Family Medicine

## 2014-05-18 VITALS — BP 130/78 | HR 85 | Temp 98.1°F | Ht 66.0 in | Wt 189.3 lb

## 2014-05-18 DIAGNOSIS — E049 Nontoxic goiter, unspecified: Secondary | ICD-10-CM

## 2014-05-18 DIAGNOSIS — E1149 Type 2 diabetes mellitus with other diabetic neurological complication: Secondary | ICD-10-CM

## 2014-05-18 DIAGNOSIS — E1142 Type 2 diabetes mellitus with diabetic polyneuropathy: Secondary | ICD-10-CM

## 2014-05-18 DIAGNOSIS — IMO0002 Reserved for concepts with insufficient information to code with codable children: Secondary | ICD-10-CM

## 2014-05-18 DIAGNOSIS — E1342 Other specified diabetes mellitus with diabetic polyneuropathy: Secondary | ICD-10-CM

## 2014-05-18 DIAGNOSIS — G629 Polyneuropathy, unspecified: Secondary | ICD-10-CM

## 2014-05-18 DIAGNOSIS — E1165 Type 2 diabetes mellitus with hyperglycemia: Secondary | ICD-10-CM

## 2014-05-18 LAB — POCT GLYCOSYLATED HEMOGLOBIN (HGB A1C): HEMOGLOBIN A1C: 14.9

## 2014-05-18 MED ORDER — GABAPENTIN 300 MG PO CAPS: 600.0000 mg | ORAL_CAPSULE | Freq: Three times a day (TID) | ORAL | Status: DC

## 2014-05-18 MED ORDER — LISINOPRIL 20 MG PO TABS: 40.0000 mg | ORAL_TABLET | Freq: Every day | ORAL | Status: DC

## 2014-05-18 MED ORDER — METFORMIN HCL 500 MG PO TABS: 1000.0000 mg | ORAL_TABLET | Freq: Two times a day (BID) | ORAL | Status: DC

## 2014-05-18 NOTE — Assessment & Plan Note (Signed)
Thyroid enlarged on exam. No masses or nodules appreciated on exam. Patient denies any heat or cold intolerance, fatigue, headaches, palpitations, shortness of breath, appetite changes.  - TSH levels ordered. Pending

## 2014-05-18 NOTE — Patient Instructions (Signed)
It was a pleasure seeing you today in our clinic. Today we discussed your diabetes and foot pain. Here is the treatment plan we have discussed and agreed upon together:   - Today I have refilled your medications. - I've increase the dosage of uric gabapentin to 600 mg 3 times a day (2 tablets 3 times a day) - I have increased her metformin to 1000 mg twice a day (2 tablets twice a day) - I have ordered lab tests for you today. This includes a A1c, CBC, CMP, TSH. I will mail you the results and a description of what they tell me. - I strongly recommend that you begin taking your daily glucose levels at home. I understand that they can be painful however it is important for you and I to both know where your glucose ranges throughout each day. - Please do not hesitate to contact me if you have any problems or questions.

## 2014-05-18 NOTE — Assessment & Plan Note (Signed)
Patient experiences significant neuropathic pain in her bilateral feet. This pain is worsened with laying down. Peripheral pulses are present bilaterally in both upper and lower extremities.  - Neuropathic pain is moderately controlled with gabapentin. At this time I have increased her dosage from 300 mg 3 times a day to now 600 mg 3 times a day.  - See "diabetes, type II, uncontrolled" for further information

## 2014-05-18 NOTE — Assessment & Plan Note (Signed)
Patient has poorly controlled type 2 diabetes which she does not regularly check her blood sugars at home because "it hurts". A1c performed today yielded a value of 14.9, this is elevated from her previous A1c performed in January of this year which was 10.2.  - A1c 14.9. - I've increased her metformin dosage from 500 mg twice a day to now 1000 mg twice a day. - Patient may have a significant amount of noncompliance contributing to this uncontrolled diabetes. I had very long conversation with her about the necessity to take her medication regularly and the consequences she is now experiencing because of her constantly high blood glucose. I informed her that at this time she is experiencing neuropathic pain in her feet secondary to this uncontrolled diabetes. Control of her glucose levels may not reverse any of the damage currently done however it will likely prevent any worsening of symptoms. Patient stated her understanding of all this.

## 2014-05-18 NOTE — Progress Notes (Signed)
HPI  Patient presents today for diabetes check and increased foot pain. Patient is a 53 year old female who presents today for diabetes check and increased foot pain over the past couple months. Patient has poorly controlled type 2 diabetes. She states that she has had increased foot pain that feel electric and like her "nerves wake up". Pain is increased with any movement. It has been progressing over the past couple months. She states that pain is worsened when she lays flat. At which time they feel cold and achy. This pain has been keeping her awake some nights. At this time patient denies any numbness, falls, or trips.  Patient denies any recent fevers, chills, nausea, vomiting, diarrhea, heat or cold intolerance, changes in appetite, changes in vision, hematochezia, melena, shortness of breath, chest pain, dizziness, vertigo.  Smoking status noted ROS: Per HPI  Objective: BP 130/78 mmHg  Pulse 85  Temp(Src) 98.1 F (36.7 C) (Oral)  Ht 5\' 6"  (1.676 m)  Wt 189 lb 4.8 oz (85.866 kg)  BMI 30.57 kg/m2  LMP 04/08/2014 Gen: NAD, alert, cooperative with exam HEENT: NCAT, EOMI, PERRL; thyroid enlarged--no firmness or nodules noted. CV: RRR, good S1/S2, no murmur Resp: CTABL, no wheezes, non-labored Abd: SNTND, BS present, no guarding or organomegaly Ext: No edema, warm; sensation intact in all 4 extremities; 5/5 strength bilaterally; +2 reflexes bilaterally; radial/dorsalis pedis/posterior tibialis pulses present and equal bilaterally. Neuro: Alert and oriented, No gross deficits  Assessment and plan:  DM (diabetes mellitus), type 2, uncontrolled Patient has poorly controlled type 2 diabetes which she does not regularly check her blood sugars at home because "it hurts". A1c performed today yielded a value of 14.9, this is elevated from her previous A1c performed in January of this year which was 10.2.  - A1c 14.9. - I've increased her metformin dosage from 500 mg twice a day to now  1000 mg twice a day. - Patient may have a significant amount of noncompliance contributing to this uncontrolled diabetes. I had very long conversation with her about the necessity to take her medication regularly and the consequences she is now experiencing because of her constantly high blood glucose. I informed her that at this time she is experiencing neuropathic pain in her feet secondary to this uncontrolled diabetes. Control of her glucose levels may not reverse any of the damage currently done however it will likely prevent any worsening of symptoms. Patient stated her understanding of all this.  Diabetic peripheral neuropathy Patient experiences significant neuropathic pain in her bilateral feet. This pain is worsened with laying down. Peripheral pulses are present bilaterally in both upper and lower extremities.  - Neuropathic pain is moderately controlled with gabapentin. At this time I have increased her dosage from 300 mg 3 times a day to now 600 mg 3 times a day.  - See "diabetes, type II, uncontrolled" for further information  Enlarged thyroid Thyroid enlarged on exam. No masses or nodules appreciated on exam. Patient denies any heat or cold intolerance, fatigue, headaches, palpitations, shortness of breath, appetite changes.  - TSH levels ordered. Pending    Orders Placed This Encounter  Procedures  . TSH  . COMPLETE METABOLIC PANEL WITH GFR  . CBC  . HgB A1c    Meds ordered this encounter  Medications  . gabapentin (NEURONTIN) 300 MG capsule    Sig: Take 2 capsules (600 mg total) by mouth 3 (three) times daily.    Dispense:  180 capsule    Refill:  3  .  lisinopril (PRINIVIL,ZESTRIL) 20 MG tablet    Sig: Take 2 tablets (40 mg total) by mouth daily.    Dispense:  60 tablet    Refill:  3  . metFORMIN (GLUCOPHAGE) 500 MG tablet    Sig: Take 2 tablets (1,000 mg total) by mouth 2 (two) times daily with a meal.    Dispense:  120 tablet    Refill:  3     Kathee DeltonIan D  Loys Shugars, MD,MS,  PGY1 05/18/2014 6:20 PM

## 2014-05-19 LAB — COMPLETE METABOLIC PANEL WITH GFR
ALT: 23 U/L (ref 0–35)
AST: 13 U/L (ref 0–37)
Albumin: 3.6 g/dL (ref 3.5–5.2)
Alkaline Phosphatase: 92 U/L (ref 39–117)
BILIRUBIN TOTAL: 0.3 mg/dL (ref 0.2–1.2)
BUN: 20 mg/dL (ref 6–23)
CHLORIDE: 105 meq/L (ref 96–112)
CO2: 23 meq/L (ref 19–32)
Calcium: 9.2 mg/dL (ref 8.4–10.5)
Creat: 0.83 mg/dL (ref 0.50–1.10)
GFR, Est African American: >89 mL/min
GFR, Est Non African American: 82 mL/min
Glucose, Bld: 327 mg/dL — ABNORMAL HIGH (ref 70–99)
Potassium: 4.3 mEq/L (ref 3.5–5.3)
SODIUM: 136 meq/L (ref 135–145)
TOTAL PROTEIN: 6.4 g/dL (ref 6.0–8.3)

## 2014-05-19 LAB — CBC
HEMATOCRIT: 39 % (ref 36.0–46.0)
Hemoglobin: 13.1 g/dL (ref 12.0–15.0)
MCH: 26.4 pg (ref 26.0–34.0)
MCHC: 33.6 g/dL (ref 30.0–36.0)
MCV: 78.6 fL (ref 78.0–100.0)
MPV: 10.6 fL (ref 9.4–12.4)
Platelets: 242 10*3/uL (ref 150–400)
RBC: 4.96 MIL/uL (ref 3.87–5.11)
RDW: 13.8 % (ref 11.5–15.5)
WBC: 8.2 10*3/uL (ref 4.0–10.5)

## 2014-05-19 LAB — TSH: TSH: 0.797 u[IU]/mL (ref 0.350–4.500)

## 2014-06-02 ENCOUNTER — Ambulatory Visit: Payer: Self-pay

## 2014-06-02 ENCOUNTER — Other Ambulatory Visit: Payer: Self-pay | Admitting: Occupational Medicine

## 2014-06-02 DIAGNOSIS — R52 Pain, unspecified: Secondary | ICD-10-CM

## 2014-06-02 DIAGNOSIS — M545 Low back pain: Secondary | ICD-10-CM

## 2014-07-07 ENCOUNTER — Telehealth: Payer: Self-pay | Admitting: Family Medicine

## 2014-07-07 NOTE — Telephone Encounter (Signed)
Pt recently started diabetes medication, has an appt Monday to come in to talk about changing it b/c it makes her really sick. Pt wants to know if she should stop the medication until Monday.

## 2014-07-10 ENCOUNTER — Ambulatory Visit: Payer: Self-pay | Admitting: Family Medicine

## 2014-07-10 NOTE — Telephone Encounter (Signed)
I apologize for the delayed response. Dr. Paulina FusiHess should be about to help her out at today's appointment.

## 2014-07-11 NOTE — Telephone Encounter (Signed)
Pt didn't show up for appt. Her next appt is with you on 2/6. Deseree Bruna PotterBlount, CMA

## 2014-07-11 NOTE — Telephone Encounter (Signed)
Pt states that she is nauseated and has diarrhea. Told her that that's the common side effect. She states that she isnt taking it anymore. Scheduled her to see dr Ermalinda Memosbradshaw on Thursday. Jereme Loren Bruna PotterBlount, CMA

## 2014-07-11 NOTE — Telephone Encounter (Signed)
She was recently started on metformin--a common side effect is Diarrhea. If this is the symptoms she is experiencing that she is describing as this "sick" feeling than I would anticipate improvement as her body adjusts to this medication. If this "sick" feeling is different than just simple diarrhea than she needs to DC taking this medication and be seen sooner than 2/6.  Thanks Deseree

## 2014-07-13 ENCOUNTER — Encounter: Payer: Self-pay | Admitting: Family Medicine

## 2014-07-13 ENCOUNTER — Ambulatory Visit (INDEPENDENT_AMBULATORY_CARE_PROVIDER_SITE_OTHER): Payer: Self-pay | Admitting: Family Medicine

## 2014-07-13 VITALS — BP 128/81 | HR 97 | Temp 98.6°F | Ht 66.0 in | Wt 187.0 lb

## 2014-07-13 DIAGNOSIS — IMO0002 Reserved for concepts with insufficient information to code with codable children: Secondary | ICD-10-CM

## 2014-07-13 DIAGNOSIS — E1165 Type 2 diabetes mellitus with hyperglycemia: Secondary | ICD-10-CM

## 2014-07-13 DIAGNOSIS — L853 Xerosis cutis: Secondary | ICD-10-CM | POA: Insufficient documentation

## 2014-07-13 DIAGNOSIS — B351 Tinea unguium: Secondary | ICD-10-CM | POA: Insufficient documentation

## 2014-07-13 LAB — COMPREHENSIVE METABOLIC PANEL
ALBUMIN: 3.9 g/dL (ref 3.5–5.2)
ALT: 11 U/L (ref 0–35)
AST: 10 U/L (ref 0–37)
Alkaline Phosphatase: 112 U/L (ref 39–117)
BUN: 16 mg/dL (ref 6–23)
CALCIUM: 9.2 mg/dL (ref 8.4–10.5)
CO2: 25 mEq/L (ref 19–32)
Chloride: 94 mEq/L — ABNORMAL LOW (ref 96–112)
Creat: 0.83 mg/dL (ref 0.50–1.10)
GLUCOSE: 586 mg/dL — AB (ref 70–99)
Potassium: 5 mEq/L (ref 3.5–5.3)
SODIUM: 126 meq/L — AB (ref 135–145)
Total Bilirubin: 0.3 mg/dL (ref 0.2–1.2)
Total Protein: 6.7 g/dL (ref 6.0–8.3)

## 2014-07-13 MED ORDER — GLIPIZIDE 10 MG PO TABS
10.0000 mg | ORAL_TABLET | Freq: Two times a day (BID) | ORAL | Status: DC
Start: 2014-07-13 — End: 2014-10-25

## 2014-07-13 NOTE — Assessment & Plan Note (Signed)
Hyperpigmented papules on back c/w healing xerotic/dry skin/eczema lesions Discussed cool quick showers, lotion, hydrocortisone, reassurance

## 2014-07-13 NOTE — Patient Instructions (Signed)
Great to meet you!  Stop the metformin, start glipizide (twice daily) Write down fasting blood sugars and bring them back  Start the fungus pill - Lamisil (terbinafine) - it is one pill daily for 12 weeks  Take short quick showers, use lots of lotion, and try hydrocortisone with or without Vaseline on the worst spots.

## 2014-07-13 NOTE — Assessment & Plan Note (Addendum)
Uncontrolled and not tolerating metformin Change to glipizide, discussed risk of hypoglycemia and that she is in uinsulin range CBGs if she oral meds + lifestyle cannot manage her sugars. DC metformin, may trial at lower dose in future Follow up PCP in 3-4 weeks.

## 2014-07-13 NOTE — Assessment & Plan Note (Signed)
Onset bilateral great toes over the last 3-4 weeks Really bothering patient and she would like treatment for this Start Lamisil 250 mg qd 12 weeks Cmp, repeat in 3-6 weeks

## 2014-07-13 NOTE — Progress Notes (Signed)
Patient ID: Kristen Garrett, female   DOB: [birth year], 54 y.o.   MRN: [id number]   HPI  Patient presents today for diabetes, rash, and concern for toe fungus  Toe fungus Has developed over the last 3-4 weeks it is bothering her cosmetically. She describes it as started at the bottom of her right great toe and then expanded across the rest of her toe. No pain, no history of athlete's foot or toe fungus before  Diabetes Trying to watch her diet Not tolerating metformin at all, states that she feels like she's been "deathly ill" since she went up on the dose. She notes that she has nausea, abdominal pain, and diarrhea. Did well on 10 mg of glyburide previously.  Skin rash On her back, described as itchy red bumps on her skin tested been there for several months. No discrete areas of draining or pain Has improved some with hydrocortisone Enjoys taking very long hot showers  Him some tongue Patient explains that she has concern about some bumps on her tongue that she just found and her last few weeks Did not bother her, they're not painful.  ROS: Per HPI  Objective: BP 128/81 mmHg  Pulse 97  Temp(Src) 98.6 F (37 C) (Oral)  Ht 5\' 6"  (1.676 m)  Wt 187 lb (84.823 kg)  BMI 30.20 kg/m2 Gen: NAD, alert, cooperative with exam HEENT: NCAT, prominent papillae on the posterior tongue  Ext: No edema, warm Neuro: Alert and oriented, No gross deficits  Feet: 2+ DP pulse BL, no lesions, Great toenails thickened and darkened slightly BL.   assurance provided for normal papillae on the tongue   Assessment and plan:  DM (diabetes mellitus), type 2, uncontrolled Uncontrolled and not tolerating metformin Change to glipizide, discussed risk of hypoglycemia and that she is in uinsulin range CBGs if she oral meds + lifestyle cannot manage her sugars. DC metformin, may trial at lower dose in future Follow up PCP in 3-4 weeks.    Xerosis of skin Hyperpigmented papules on back c/w healing  xerotic/dry skin/eczema lesions Discussed cool quick showers, lotion, hydrocortisone, reassurance   Onychomycosis Onset bilateral great toes over the last 3-4 weeks Really bothering patient and she would like treatment for this Start Lamisil 250 mg qd 12 weeks Cmp, repeat in 3-6 weeks    Orders Placed This Encounter  Procedures  . Comprehensive metabolic panel    Meds ordered this encounter  Medications  . glipiZIDE (GLUCOTROL) 10 MG tablet    Sig: Take 1 tablet (10 mg total) by mouth 2 (two) times daily before a meal.    Dispense:  60 tablet    Refill:  3

## 2014-07-14 ENCOUNTER — Telehealth: Payer: Self-pay | Admitting: Family Medicine

## 2014-07-14 DIAGNOSIS — B351 Tinea unguium: Secondary | ICD-10-CM

## 2014-07-14 MED ORDER — TERBINAFINE HCL 250 MG PO TABS: 250.0000 mg | ORAL_TABLET | Freq: Every day | ORAL | Status: DC

## 2014-07-14 NOTE — Telephone Encounter (Signed)
Called to discuss labs.   Patient had extremely had glucose and AG 19 with normal bicarb indicating ketosis without acidosis. She states she started her glipizide this am and feels very good. She is tolerating fluids easily.   She also states that she checked her CBG herself this morning, and it was 400. She will call us if her CBGs do not improve below 300 over the next few days.   At this time she is tolerating PO fluids easily and has no symptoms of DKA, she is starting a new medication as well. I do not think she needs urgent eval.   She has plans for follow up with her PCP on 2/5.   Murtis SinkSam Bradshaw, MD [facility] Resident, PGY-3 07/14/2014, 10:15 AM

## 2014-07-14 NOTE — Telephone Encounter (Signed)
[facility] with Critical Value  Glu = 586,   Called Kevin FentonSamuel Bradshaw, MD reporting Glu = 586, Na = 126, K = 5.0, Cl = 94, CO2 = 25

## 2014-07-18 NOTE — Telephone Encounter (Signed)
Pt calling about glipizide that she was prescribed by Dr. Ermalinda MemosBradshaw.  Since taking medcation, blood sugars registering btwn 300 and 400.   She is concerned that the dosage is too low.  Want to ask if she can double the amount to take to see if it will bring her sugars down.  Please advise.

## 2014-08-04 ENCOUNTER — Encounter: Payer: Self-pay | Admitting: Family Medicine

## 2014-08-18 ENCOUNTER — Encounter: Payer: Self-pay | Admitting: Family Medicine

## 2014-09-17 ENCOUNTER — Encounter (HOSPITAL_COMMUNITY): Payer: Self-pay | Admitting: Emergency Medicine

## 2014-09-17 ENCOUNTER — Emergency Department (INDEPENDENT_AMBULATORY_CARE_PROVIDER_SITE_OTHER)
Admission: EM | Admit: 2014-09-17 | Discharge: 2014-09-17 | Disposition: A | Discharge status: Home / Self Care | Payer: Managed Care, Other (non HMO) | Source: Home / Self Care | Attending: Family Medicine | Admitting: Family Medicine

## 2014-09-17 DIAGNOSIS — G8929 Other chronic pain: Secondary | ICD-10-CM | POA: Diagnosis not present

## 2014-09-17 DIAGNOSIS — M5442 Lumbago with sciatica, left side: Secondary | ICD-10-CM

## 2014-09-17 DIAGNOSIS — M7989 Other specified soft tissue disorders: Secondary | ICD-10-CM

## 2014-09-17 DIAGNOSIS — M549 Dorsalgia, unspecified: Secondary | ICD-10-CM

## 2014-09-17 MED ORDER — GABAPENTIN 300 MG PO CAPS: 900.0000 mg | ORAL_CAPSULE | Freq: Three times a day (TID) | ORAL | Status: DC

## 2014-09-17 MED ORDER — ETODOLAC 500 MG PO TABS: 500.0000 mg | ORAL_TABLET | Freq: Two times a day (BID) | ORAL | Status: DC

## 2014-09-17 NOTE — ED Notes (Signed)
Back pain, history of the same.  Pain in low back and into left leg.  Patient reports swelling in left lower leg.  Bilateral dp's 2 +.  Both legs cool to touch, but equally cool.

## 2014-09-17 NOTE — ED Provider Notes (Signed)
CSN: [id number]     Arrival date & time 09/17/14  1803 History   First MD Initiated Contact with Patient 09/17/14 1934     Chief Complaint  Patient presents with  . Back Pain  . Leg Swelling   (Consider location/radiation/quality/duration/timing/severity/associated sxs/prior Treatment) HPI          54 year old female presents complaining of left lower leg swelling as well as back pain radiating into her left leg. The left leg swelling started today. It is not painful, but she has no swelling below the knee. She denies any recent travel, long car trips or plane trips. She had a DVT when she was in her 44s, about 30 years ago. She has varicose veins in both of her legs. She had an ultrasound of her legs in 2014 for the same problem which was negative. She denies any fevers Also she complains of back pain radiating into her left thigh. This is been constant to work injury about 7 months ago. She has been seen multiple times for this. She is currently involved in a lawsuit because her workers compensation firm did not want to cover her injury. This pain has not changed. It is worse with movement. No loss of bowel or bladder control. No extremity numbness or weakness  Past Medical History  Diagnosis Date  . Diabetes mellitus   . Hyperlipidemia   . Hyperlipidemia   . DVT of lower limb, acute Age 11    Went on blood thinner for a few months   Past Surgical History  Procedure Laterality Date  . Tubal ligation    . Endometrial ablation w/ novasure     Family History  Problem Relation Age of Onset  . Diabetes Mother   . Hypertension Mother   . Hyperlipidemia Mother   . Diabetes Father   . Hyperlipidemia Father    History  Substance Use Topics  . Smoking status: Current Every Day Smoker -- 0.50 packs/day  . Smokeless tobacco: Not on file     Comment: decreased smoking  . Alcohol Use: No   OB History    Gravida Para Term Preterm AB TAB SAB Ectopic Multiple Living   Review of Systems  Cardiovascular: Positive for leg swelling.  Musculoskeletal: Positive for back pain (radiating into the left leg).  All other systems reviewed and are negative.   Allergies  Review of patient's allergies indicates no known allergies.  Home Medications   Prior to Admission medications   Medication Sig Start Date End Date Taking? Authorizing Provider  aspirin 81 MG chewable tablet Chew 81 mg by mouth daily.      Historical Provider, MD  etodolac (LODINE) 500 MG tablet Take 1 tablet (500 mg total) by mouth 2 (two) times daily. 09/17/14   Graylon Good, PA-C  gabapentin (NEURONTIN) 300 MG capsule Take 2 capsules (600 mg total) by mouth 3 (three) times daily. 05/18/14   Kathee Delton, MD  gabapentin (NEURONTIN) 300 MG capsule Take 3 capsules (900 mg total) by mouth 3 (three) times daily. 09/17/14   Adrian Blackwater Delayni Streed, PA-C  glipiZIDE (GLUCOTROL) 10 MG tablet Take 1 tablet (10 mg total) by mouth 2 (two) times daily before a meal. 07/13/14   Elenora Gamma, MD  ibuprofen (ADVIL,MOTRIN) 200 MG tablet Take 3 tablets (600 mg total) by mouth every 6 (six) hours as needed. 07/27/13   Garnetta Buddy, MD  lisinopril (PRINIVIL,ZESTRIL) 20 MG tablet Take 2 tablets (40 mg total) by mouth daily. 05/18/14   Kathee Delton, MD  terbinafine (LAMISIL) 250 MG tablet Take 1 tablet (250 mg total) by mouth daily. 07/14/14   Elenora Gamma, MD   BP 142/87 mmHg  Pulse 87  Temp(Src) 98.4 F (36.9 C) (Oral)  Resp 16  SpO2 100% Physical Exam  Constitutional: She is oriented to person, place, and time. Vital signs are normal. She appears well-developed and well-nourished. No distress.  HENT:  Head: Normocephalic and atraumatic.  Cardiovascular:  Pulses:      Dorsalis pedis pulses are 2+ on the right side, and 2+ on the left side.  She has trace pitting edema on her bilateral lower extremities to the mid calf, slightly worse on the left  Pulmonary/Chest: Effort normal. No respiratory  distress.  Musculoskeletal:       Lumbar back: She exhibits tenderness (tenderness across the entire lower back without any focal tenderness or bony tenderness) and pain. She exhibits normal range of motion, no swelling, no deformity and no spasm.       Left lower leg: She exhibits tenderness (there is mild tenderness with deep palpation of the calf, no palpable cord) and swelling (there are superficial varicosities of bilateral lower extremities, slightly more dilated on the left.).  Negative straight leg raise and cross straight leg raise  Neurological: She is alert and oriented to person, place, and time. She has normal strength. Coordination normal.  Skin: Skin is warm and dry. No rash noted. She is not diaphoretic.  Psychiatric: She has a normal mood and affect. Judgment normal.  Nursing note and vitals reviewed.   ED Course  Procedures (including critical care time) Labs Review Labs Reviewed - No data to display  Imaging Review No results found.   MDM   1. Left-sided low back pain with left-sided sciatica   2. Chronic back pain   3. Left leg swelling    For the chronic back pain, I will start her on a different anti-inflammatory because ibuprofen is not working. She takes gabapentin 600 mg 3 times a day, we will temporarily increase this to 900 mg and she will follow-up with primary care.  For the leg swelling, we will need to rule out DVT. I do not suspect any infection at this time. The most likely cause a superficial thrombophlebitis given her varicosities. Week and get her a venous ultrasound in the morning, I will be in contact with her primary care office at the [facility] to get this arranged. She has been instructed to go to the emergency department if she is not heard anything by the afternoon to have this swelling evaluated. We discussed the option of an initial dose of a blood thinner such as Lovenox, I discussed the risks and benefits of this with her,  she declines a blood thinners at this time. She will go to the emergency department if she stretches any chest pain or shortness of breath.   Meds ordered this encounter  Medications  . etodolac (LODINE) 500 MG tablet    Sig: Take 1 tablet (500 mg total) by mouth 2 (two) times daily.    Dispense:  60 tablet    Refill:  1  . gabapentin (NEURONTIN) 300 MG capsule    Sig: Take 3 capsules (900 mg total) by mouth 3 (three) times daily.    Dispense:  60 capsule    Refill:  0  The patient had some trouble getting the ultrasound today. It was finally arranged she was called and she did decline to get the ultrasound. I informed her that she needs to go to the emergency department if she cannot get the ultrasound but she declines. I have informed her the risks of this and she still declines  Graylon GoodZachary H Tandi Hanko, PA-C 09/18/14 1850

## 2014-09-17 NOTE — Discharge Instructions (Signed)
Call your primary care provider's office in the morning to arrange an ultrasound to rule out a DVT in your left leg. I will call them as well. Someone should be in touch with you about getting this study completed. Go to the emergency department for evaluation if you are unable to get in touch with your primary care provider's office   Back Pain, Adult Low back pain is very common. About 1 in 5 people have back pain.The cause of low back pain is rarely dangerous. The pain often gets better over time.About half of people with a sudden onset of back pain feel better in just 2 weeks. About 8 in 10 people feel better by 6 weeks.  CAUSES Some common causes of back pain include:  Strain of the muscles or ligaments supporting the spine.  Wear and tear (degeneration) of the spinal discs.  Arthritis.  Direct injury to the back. DIAGNOSIS Most of the time, the direct cause of low back pain is not known.However, back pain can be treated effectively even when the exact cause of the pain is unknown.Answering your caregiver's questions about your overall health and symptoms is one of the most accurate ways to make sure the cause of your pain is not dangerous. If your caregiver needs more information, he or she may order lab work or imaging tests (X-rays or MRIs).However, even if imaging tests show changes in your back, this usually does not require surgery. HOME CARE INSTRUCTIONS For many people, back pain returns.Since low back pain is rarely dangerous, it is often a condition that people can learn to [facility] their own.   Remain active. It is stressful on the back to sit or stand in one place. Do not sit, drive, or stand in one place for more than 30 minutes at a time. Take short walks on level surfaces as soon as pain allows.Try to increase the length of time you walk each day.  Do not stay in bed.Resting more than 1 or 2 days can delay your recovery.  Do not avoid exercise or work.Your body  is made to move.It is not dangerous to be active, even though your back may hurt.Your back will likely heal faster if you return to being active before your pain is gone.  Pay attention to your body when you bend and lift. Many people have less discomfortwhen lifting if they bend their knees, keep the load close to their bodies,and avoid twisting. Often, the most comfortable positions are those that put less stress on your recovering back.  Find a comfortable position to sleep. Use a firm mattress and lie on your side with your knees slightly bent. If you lie on your back, put a pillow under your knees.  Only take over-the-counter or prescription medicines as directed by your caregiver. Over-the-counter medicines to reduce pain and inflammation are often the most helpful.Your caregiver may prescribe muscle relaxant drugs.These medicines help dull your pain so you can more quickly return to your normal activities and healthy exercise.  Put ice on the injured area.  Put ice in a plastic bag.  Place a towel between your skin and the bag.  Leave the ice on for 15-20 minutes, 03-04 times a day for the first 2 to 3 days. After that, ice and heat may be alternated to reduce pain and spasms.  Ask your caregiver about trying back exercises and gentle massage. This may be of some benefit.  Avoid feeling anxious or stressed.Stress increases muscle tension and can  worsen back pain.It is important to recognize when you are anxious or stressed and learn ways to manage it.Exercise is a great option. SEEK MEDICAL CARE IF:  You have pain that is not relieved with rest or medicine.  You have pain that does not improve in 1 week.  You have new symptoms.  You are generally not feeling well. SEEK IMMEDIATE MEDICAL CARE IF:   You have pain that radiates from your back into your legs.  You develop new bowel or bladder control problems.  You have unusual weakness or numbness in your arms or  legs.  You develop nausea or vomiting.  You develop abdominal pain.  You feel faint. Document Released: 06/16/2005 Document Revised: 12/16/2011 Document Reviewed: 10/18/2013 [facility] Patient Information 2015 [place], [place]. This information is not intended to replace advice given to you by your health care provider. Make sure you discuss any questions you have with your health care provider.  Chronic Back Pain  When back pain lasts longer than 3 months, it is called chronic back pain.People with chronic back pain often go through certain periods that are more intense (flare-ups).  CAUSES Chronic back pain can be caused by wear and tear (degeneration) on different structures in your back. These structures include:  The bones of your spine (vertebrae) and the joints surrounding your spinal cord and nerve roots (facets).  The strong, fibrous tissues that connect your vertebrae (ligaments). Degeneration of these structures may result in pressure on your nerves. This can lead to constant pain. HOME CARE INSTRUCTIONS  Avoid bending, heavy lifting, prolonged sitting, and activities which make the problem worse.  Take brief periods of rest throughout the day to reduce your pain. Lying down or standing usually is better than sitting while you are resting.  Take over-the-counter or prescription medicines only as directed by your caregiver. SEEK IMMEDIATE MEDICAL CARE IF:   You have weakness or numbness in one of your legs or feet.  You have trouble controlling your bladder or bowels.  You have nausea, vomiting, abdominal pain, shortness of breath, or fainting. Document Released: 07/24/2004 Document Revised: 09/08/2011 Document Reviewed: 05/31/2011 [facility] Patient Information 2015 [place], [place]. This information is not intended to replace advice given to you by your health care provider. Make sure you discuss any questions you have with your health care provider.  Edema Edema is an  abnormal buildup of fluids in your bodytissues. Edema is somewhatdependent on gravity to pull the fluid to the lowest place in your body. That makes the condition more common in the legs and thighs (lower extremities). Painless swelling of the feet and ankles is common and becomes more likely as you get older. It is also common in looser tissues, like around your eyes.  When the affected area is squeezed, the fluid may move out of that spot and leave a dent for a few moments. This dent is called pitting.  CAUSES  There are many possible causes of edema. Eating too much salt and being on your feet or sitting for a long time can cause edema in your legs and ankles. Hot weather may make edema worse. Common medical causes of edema include:  Heart failure.  Liver disease.  Kidney disease.  Weak blood vessels in your legs.  Cancer.  An injury.  Pregnancy.  Some medications.  Obesity. SYMPTOMS  Edema is usually painless.Your skin may look swollen or shiny.  DIAGNOSIS  Your health care provider may be able to diagnose edema by asking about your  medical history and doing a physical exam. You may need to have tests such as X-rays, an electrocardiogram, or blood tests to check for medical conditions that may cause edema.  TREATMENT  Edema treatment depends on the cause. If you have heart, liver, or kidney disease, you need the treatment appropriate for these conditions. General treatment may include:  Elevation of the affected body part above the level of your heart.  Compression of the affected body part. Pressure from elastic bandages or support stockings squeezes the tissues and forces fluid back into the blood vessels. This keeps fluid from entering the tissues.  Restriction of fluid and salt intake.  Use of a water pill (diuretic). These medications are appropriate only for some types of edema. They pull fluid out of your body and make you urinate more often. This gets rid of fluid  and reduces swelling, but diuretics can have side effects. Only use diuretics as directed by your health care provider. HOME CARE INSTRUCTIONS   Keep the affected body part above the level of your heart when you are lying down.   Do not sit still or stand for prolonged periods.   Do not put anything directly under your knees when lying down.  Do not wear constricting clothing or garters on your upper legs.   Exercise your legs to work the fluid back into your blood vessels. This may help the swelling go down.   Wear elastic bandages or support stockings to reduce ankle swelling as directed by your health care provider.   Eat a low-salt diet to reduce fluid if your health care provider recommends it.   Only take medicines as directed by your health care provider. SEEK MEDICAL CARE IF:   Your edema is not responding to treatment.  You have heart, liver, or kidney disease and notice symptoms of edema.  You have edema in your legs that does not improve after elevating them.   You have sudden and unexplained weight gain. SEEK IMMEDIATE MEDICAL CARE IF:   You develop shortness of breath or chest pain.   You cannot breathe when you lie down.  You develop pain, redness, or warmth in the swollen areas.   You have heart, liver, or kidney disease and suddenly get edema.  You have a fever and your symptoms suddenly get worse. MAKE SURE YOU:   Understand these instructions.  Will watch your condition.  Will get help right away if you are not doing well or get worse. Document Released: 06/16/2005 Document Revised: 10/31/2013 Document Reviewed: 04/08/2013 [facility] Patient Information 2015 [place]. This information is not intended to replace advice given to you by your health care provider. Make sure you discuss any questions you have with your health care provider.  Sciatica Sciatica is pain, weakness, numbness, or tingling along the path of the sciatic nerve. The  nerve starts in the lower back and runs down the back of each leg. The nerve controls the muscles in the lower leg and in the back of the knee, while also providing sensation to the back of the thigh, lower leg, and the sole of your foot. Sciatica is a symptom of another medical condition. For instance, nerve damage or certain conditions, such as a herniated disk or bone spur on the spine, pinch or put pressure on the sciatic nerve. This causes the pain, weakness, or other sensations normally associated with sciatica. Generally, sciatica only affects one side of the body. CAUSES   Herniated or slipped disc.  Degenerative disk disease.  A pain disorder involving the narrow muscle in the buttocks (piriformis syndrome).  Pelvic injury or fracture.  Pregnancy.  Tumor (rare). SYMPTOMS  Symptoms can vary from mild to very severe. The symptoms usually travel from the low back to the buttocks and down the back of the leg. Symptoms can include:  Mild tingling or dull aches in the lower back, leg, or hip.  Numbness in the back of the calf or sole of the foot.  Burning sensations in the lower back, leg, or hip.  Sharp pains in the lower back, leg, or hip.  Leg weakness.  Severe back pain inhibiting movement. These symptoms may get worse with coughing, sneezing, laughing, or prolonged sitting or standing. Also, being overweight may worsen symptoms. DIAGNOSIS  Your caregiver will perform a physical exam to look for common symptoms of sciatica. He or she may ask you to do certain movements or activities that would trigger sciatic nerve pain. Other tests may be performed to find the cause of the sciatica. These may include:  Blood tests.  X-rays.  Imaging tests, such as an MRI or CT scan. TREATMENT  Treatment is directed at the cause of the sciatic pain. Sometimes, treatment is not necessary and the pain and discomfort goes away on its own. If treatment is needed, your caregiver may  suggest:  Over-the-counter medicines to relieve pain.  Prescription medicines, such as anti-inflammatory medicine, muscle relaxants, or narcotics.  Applying heat or ice to the painful area.  Steroid injections to lessen pain, irritation, and inflammation around the nerve.  Reducing activity during periods of pain.  Exercising and stretching to strengthen your abdomen and improve flexibility of your spine. Your caregiver may suggest losing weight if the extra weight makes the back pain worse.  Physical therapy.  Surgery to eliminate what is pressing or pinching the nerve, such as a bone spur or part of a herniated disk. HOME CARE INSTRUCTIONS   Only take over-the-counter or prescription medicines for pain or discomfort as directed by your caregiver.  Apply ice to the affected area for 20 minutes, 3-4 times a day for the first 48-72 hours. Then try heat in the same way.  Exercise, stretch, or perform your usual activities if these do not aggravate your pain.  Attend physical therapy sessions as directed by your caregiver.  Keep all follow-up appointments as directed by your caregiver.  Do not wear high heels or shoes that do not provide proper support.  Check your mattress to see if it is too soft. A firm mattress may lessen your pain and discomfort. SEEK IMMEDIATE MEDICAL CARE IF:   You lose control of your bowel or bladder (incontinence).  You have increasing weakness in the lower back, pelvis, buttocks, or legs.  You have redness or swelling of your back.  You have a burning sensation when you urinate.  You have pain that gets worse when you lie down or awakens you at night.  Your pain is worse than you have experienced in the past.  Your pain is lasting longer than 4 weeks.  You are suddenly losing weight without reason. MAKE SURE YOU:  Understand these instructions.  Will watch your condition.  Will get help right away if you are not doing well or get  worse. Document Released: 06/10/2001 Document Revised: 12/16/2011 Document Reviewed: 10/26/2011 [facility] Patient Information 2015 [place], [place]. This information is not intended to replace advice given to you by your health care provider. Make sure you  discuss any questions you have with your health care provider. ° °

## 2014-09-18 ENCOUNTER — Inpatient Hospital Stay (HOSPITAL_COMMUNITY): Admission: RE | Admit: 2014-09-18 | Payer: Self-pay | Source: Ambulatory Visit

## 2014-09-18 ENCOUNTER — Telehealth: Payer: Self-pay | Admitting: Family Medicine

## 2014-09-18 DIAGNOSIS — R600 Localized edema: Secondary | ICD-10-CM | POA: Insufficient documentation

## 2014-09-18 NOTE — Telephone Encounter (Signed)
Earliest available date for a non stat duplex was 3/24 at 8am, patient informed. FYI to MD.

## 2014-09-18 NOTE — Telephone Encounter (Signed)
[facility]ceie[facility]. She needs an US. I cannot get her on the phone (have tried both numbers and left VM), but I will place the order and ask nursing to followup.

## 2014-09-19 NOTE — ED Notes (Signed)
zach  Location managerBaker  Spoke  With  Pt   Upon her  Request on work  note

## 2014-09-19 NOTE — Addendum Note (Signed)
Addended by: Elenora GammaBRADSHAW, SAMUEL L on: 09/19/2014 12:00 PM   Modules accepted: Orders

## 2014-09-19 NOTE — Telephone Encounter (Signed)
This should be a stat ultrasound.   Murtis SinkSam Dema Timmons, MD [facility] Resident, PGY-3 09/19/2014, 11:58 AM

## 2014-09-21 ENCOUNTER — Other Ambulatory Visit (HOSPITAL_COMMUNITY): Payer: Self-pay | Admitting: Cardiology

## 2014-09-21 ENCOUNTER — Ambulatory Visit (HOSPITAL_COMMUNITY): Payer: Managed Care, Other (non HMO) | Attending: Cardiology | Admitting: Cardiology

## 2014-09-21 ENCOUNTER — Encounter (HOSPITAL_COMMUNITY): Payer: Self-pay

## 2014-09-21 DIAGNOSIS — M7989 Other specified soft tissue disorders: Secondary | ICD-10-CM | POA: Diagnosis not present

## 2014-09-21 DIAGNOSIS — I10 Essential (primary) hypertension: Secondary | ICD-10-CM | POA: Diagnosis not present

## 2014-09-21 DIAGNOSIS — E785 Hyperlipidemia, unspecified: Secondary | ICD-10-CM | POA: Insufficient documentation

## 2014-09-21 DIAGNOSIS — M79605 Pain in left leg: Secondary | ICD-10-CM | POA: Diagnosis not present

## 2014-09-21 DIAGNOSIS — R600 Localized edema: Secondary | ICD-10-CM

## 2014-09-21 DIAGNOSIS — Z720 Tobacco use: Secondary | ICD-10-CM | POA: Diagnosis not present

## 2014-09-21 DIAGNOSIS — E119 Type 2 diabetes mellitus without complications: Secondary | ICD-10-CM | POA: Diagnosis not present

## 2014-09-21 NOTE — Progress Notes (Signed)
Left lower venous duplex performed  

## 2014-09-28 ENCOUNTER — Other Ambulatory Visit: Payer: Self-pay | Admitting: Family Medicine

## 2014-09-28 NOTE — Telephone Encounter (Signed)
Pt calls about status on gabapentin.  Ask pt about printed Rx she was given on 09/17/14, she states that she accidentally sent that one to her lawyer and is waiting to get it back. She states that she has taken the last one today.  Advised of 24-48 hour turn around.  Pt is understanding but still says she really needs a new script.  Advised I would send message MD Fleeger, Maryjo RochesterJessica Dawn

## 2014-10-10 ENCOUNTER — Encounter: Payer: Self-pay | Admitting: Family Medicine

## 2014-10-25 ENCOUNTER — Other Ambulatory Visit: Payer: Self-pay | Admitting: Family Medicine

## 2014-11-10 ENCOUNTER — Ambulatory Visit: Payer: Self-pay | Admitting: Family Medicine

## 2014-11-16 ENCOUNTER — Ambulatory Visit: Payer: Self-pay | Admitting: Family Medicine

## 2014-11-20 ENCOUNTER — Ambulatory Visit (INDEPENDENT_AMBULATORY_CARE_PROVIDER_SITE_OTHER): Payer: Managed Care, Other (non HMO) | Admitting: Family Medicine

## 2014-11-20 ENCOUNTER — Encounter: Payer: Self-pay | Admitting: Family Medicine

## 2014-11-20 VITALS — BP 136/80 | HR 90 | Temp 98.3°F | Ht 66.0 in | Wt 197.7 lb

## 2014-11-20 DIAGNOSIS — E1165 Type 2 diabetes mellitus with hyperglycemia: Secondary | ICD-10-CM | POA: Diagnosis not present

## 2014-11-20 DIAGNOSIS — Z0000 Encounter for general adult medical examination without abnormal findings: Secondary | ICD-10-CM | POA: Diagnosis not present

## 2014-11-20 DIAGNOSIS — Z716 Tobacco abuse counseling: Secondary | ICD-10-CM

## 2014-11-20 DIAGNOSIS — IMO0002 Reserved for concepts with insufficient information to code with codable children: Secondary | ICD-10-CM

## 2014-11-20 LAB — LIPID PANEL
Cholesterol: 231 mg/dL — ABNORMAL HIGH (ref 0–200)
HDL: 28 mg/dL — ABNORMAL LOW (ref >=46)
TRIGLYCERIDES: 870 mg/dL — AB (ref <150)
Total CHOL/HDL Ratio: 8.3 Ratio

## 2014-11-20 LAB — BASIC METABOLIC PANEL WITH GFR
BUN: 16 mg/dL (ref 6–23)
CO2: 23 mEq/L (ref 19–32)
Calcium: 9.4 mg/dL (ref 8.4–10.5)
Chloride: 102 mEq/L (ref 96–112)
Creat: 0.75 mg/dL (ref 0.50–1.10)
GFR, Est African American: >89 mL/min
GFR, Est Non African American: >89 mL/min
GLUCOSE: 300 mg/dL — AB (ref 70–99)
Potassium: 4.6 mEq/L (ref 3.5–5.3)
Sodium: 133 mEq/L — ABNORMAL LOW (ref 135–145)

## 2014-11-20 LAB — POCT GLYCOSYLATED HEMOGLOBIN (HGB A1C): HEMOGLOBIN A1C: 12

## 2014-11-20 MED ORDER — NICOTINE POLACRILEX 4 MG MT LOZG: 4.0000 mg | LOZENGE | OROMUCOSAL | Status: DC | PRN

## 2014-11-20 MED ORDER — METFORMIN HCL 500 MG PO TABS: 500.0000 mg | ORAL_TABLET | Freq: Three times a day (TID) | ORAL | Status: DC

## 2014-11-20 MED ORDER — NICOTINE 21 MG/24HR TD PT24: 21.0000 mg | MEDICATED_PATCH | Freq: Every day | TRANSDERMAL | Status: DC

## 2014-11-20 NOTE — Patient Instructions (Addendum)
It was a pleasure seeing you today in our clinic. Today we discussed your diabetes and smoking cessation. Here is the treatment plan we have discussed and agreed upon together:   - I will mail the lab results for the blood tests to you. - Keep up the positive attitude to quit smoking! There is a helpful number to call with various options: 1-800-QUIT-NOW. I will try to call you in a week to see how you are progressing - Please try to schedule that mammogram and colonoscopy sometime in the next couple months! Health Maintenance Adopting a healthy lifestyle and getting preventive care can go a long way to promote health and wellness. Talk with your health care provider about what schedule of regular examinations is right for you. This is a good chance for you to check in with your provider about disease prevention and staying healthy. In between checkups, there are plenty of things you can do on your own. Experts have done a lot of research about which lifestyle changes and preventive measures are most likely to keep you healthy. Ask your health care provider for more information. WEIGHT AND DIET  Eat a healthy diet  Be sure to include plenty of vegetables, fruits, low-fat dairy products, and lean protein.  Do not eat a lot of foods high in solid fats, added sugars, or salt.  Get regular exercise. This is one of the most important things you can do for your health.  Most adults should exercise for at least 150 minutes each week. The exercise should increase your heart rate and make you sweat (moderate-intensity exercise).  Most adults should also do strengthening exercises at least twice a week. This is in addition to the moderate-intensity exercise.  Maintain a healthy weight  Body mass index (BMI) is a measurement that can be used to identify possible weight problems. It estimates body fat based on height and weight. Your health care provider can help determine your BMI and help you achieve  or maintain a healthy weight.  For females 61 years of age and older:   A BMI below 18.5 is considered underweight.  A BMI of 18.5 to 24.9 is normal.  A BMI of 25 to 29.9 is considered overweight.  A BMI of 30 and above is considered obese.  Watch levels of cholesterol and blood lipids  You should start having your blood tested for lipids and cholesterol at 52 years of age, then have this test every 5 years.  You may need to have your cholesterol levels checked more often if:  Your lipid or cholesterol levels are high.  You are older than 52 years of age.  You are at high risk for heart disease.  CANCER SCREENING   Lung Cancer  Lung cancer screening is recommended for adults 79-3 years old who are at high risk for lung cancer because of a history of smoking.  A yearly low-dose CT scan of the lungs is recommended for people who:  Currently smoke.  Have quit within the past 15 years.  Have at least a 30-pack-year history of smoking. A pack year is smoking an average of one pack of cigarettes a day for 1 year.  Yearly screening should continue until it has been 15 years since you quit.  Yearly screening should stop if you develop a health problem that would prevent you from having lung cancer treatment.  Breast Cancer  Practice breast self-awareness. This means understanding how your breasts normally appear and feel.  It  also means doing regular breast self-exams. Let your health care provider know about any changes, no matter how small.  If you are in your 20s or 30s, you should have a clinical breast exam (CBE) by a health care provider every 1-3 years as part of a regular health exam.  If you are 9 or older, have a CBE every year. Also consider having a breast X-ray (mammogram) every year.  If you have a family history of breast cancer, talk to your health care provider about genetic screening.  If you are at high risk for breast cancer, talk to your health  care provider about having an MRI and a mammogram every year.  Breast cancer gene (BRCA) assessment is recommended for women who have family members with BRCA-related cancers. BRCA-related cancers include:  Breast.  Ovarian.  Tubal.  Peritoneal cancers.  Results of the assessment will determine the need for genetic counseling and BRCA1 and BRCA2 testing. Cervical Cancer Routine pelvic examinations to screen for cervical cancer are no longer recommended for nonpregnant women who are considered low risk for cancer of the pelvic organs (ovaries, uterus, and vagina) and who do not have symptoms. A pelvic examination may be necessary if you have symptoms including those associated with pelvic infections. Ask your health care provider if a screening pelvic exam is right for you.   The Pap test is the screening test for cervical cancer for women who are considered at risk.  If you had a hysterectomy for a problem that was not cancer or a condition that could lead to cancer, then you no longer need Pap tests.  If you are older than 65 years, and you have had normal Pap tests for the past 10 years, you no longer need to have Pap tests.  If you have had past treatment for cervical cancer or a condition that could lead to cancer, you need Pap tests and screening for cancer for at least 20 years after your treatment.  If you no longer get a Pap test, assess your risk factors if they change (such as having a new sexual partner). This can affect whether you should start being screened again.  Some women have medical problems that increase their chance of getting cervical cancer. If this is the case for you, your health care provider may recommend more frequent screening and Pap tests.  The human papillomavirus (HPV) test is another test that may be used for cervical cancer screening. The HPV test looks for the virus that can cause cell changes in the cervix. The cells collected during the Pap test can  be tested for HPV.  The HPV test can be used to screen women 9 years of age and older. Getting tested for HPV can extend the interval between normal Pap tests from three to five years.  An HPV test also should be used to screen women of any age who have unclear Pap test results.  After 52 years of age, women should have HPV testing as often as Pap tests.  Colorectal Cancer  This type of cancer can be detected and often prevented.  Routine colorectal cancer screening usually begins at 52 years of age and continues through 52 years of age.  Your health care provider may recommend screening at an earlier age if you have risk factors for colon cancer.  Your health care provider may also recommend using home test kits to check for hidden blood in the stool.  A small camera at the end  of a tube can be used to examine your colon directly (sigmoidoscopy or colonoscopy). This is done to check for the earliest forms of colorectal cancer.  Routine screening usually begins at age 25.  Direct examination of the colon should be repeated every 5-10 years through 52 years of age. However, you may need to be screened more often if early forms of precancerous polyps or small growths are found. Skin Cancer  Check your skin from head to toe regularly.  Tell your health care provider about any new moles or changes in moles, especially if there is a change in a mole's shape or color.  Also tell your health care provider if you have a mole that is larger than the size of a pencil eraser.  Always use sunscreen. Apply sunscreen liberally and repeatedly throughout the day.  Protect yourself by wearing long sleeves, pants, a wide-brimmed hat, and sunglasses whenever you are outside. HEART DISEASE, DIABETES, AND HIGH BLOOD PRESSURE   Have your blood pressure checked at least every 1-2 years. High blood pressure causes heart disease and increases the risk of stroke.  If you are between 68 years and 45  years old, ask your health care provider if you should take aspirin to prevent strokes.  Have regular diabetes screenings. This involves taking a blood sample to check your fasting blood sugar level.  If you are at a normal weight and have a low risk for diabetes, have this test once every three years after 52 years of age.  If you are overweight and have a high risk for diabetes, consider being tested at a younger age or more often. PREVENTING INFECTION  Hepatitis B  If you have a higher risk for hepatitis B, you should be screened for this virus. You are considered at high risk for hepatitis B if:  You were born in a country where hepatitis B is common. Ask your health care provider which countries are considered high risk.  Your parents were born in a high-risk country, and you have not been immunized against hepatitis B (hepatitis B vaccine).  You have HIV or AIDS.  You use needles to inject street drugs.  You live with someone who has hepatitis B.  You have had sex with someone who has hepatitis B.  You get hemodialysis treatment.  You take certain medicines for conditions, including cancer, organ transplantation, and autoimmune conditions. Hepatitis C  Blood testing is recommended for:  Everyone born from [birth year] through 1963.  Anyone with known risk factors for hepatitis C. Sexually transmitted infections (STIs)  You should be screened for sexually transmitted infections (STIs) including gonorrhea and chlamydia if:  You are sexually active and are younger than 52 years of age.  You are older than 52 years of age and your health care provider tells you that you are at risk for this type of infection.  Your sexual activity has changed since you were last screened and you are at an increased risk for chlamydia or gonorrhea. Ask your health care provider if you are at risk.  If you do not have HIV, but are at risk, it may be recommended that you take a prescription  medicine daily to prevent HIV infection. This is called pre-exposure prophylaxis (PrEP). You are considered at risk if:  You are sexually active and do not regularly use condoms or know the HIV status of your partner(s).  You take drugs by injection.  You are sexually active with a partner who has HIV.  Talk with your health care provider about whether you are at high risk of being infected with HIV. If you choose to begin PrEP, you should first be tested for HIV. You should then be tested every 3 months for as long as you are taking PrEP.  PREGNANCY   If you are premenopausal and you may become pregnant, ask your health care provider about preconception counseling.  If you may become pregnant, take 400 to 800 micrograms (mcg) of folic acid every day.  If you want to prevent pregnancy, talk to your health care provider about birth control (contraception). OSTEOPOROSIS AND MENOPAUSE   Osteoporosis is a disease in which the bones lose minerals and strength with aging. This can result in serious bone fractures. Your risk for osteoporosis can be identified using a bone density scan.  If you are 98 years of age or older, or if you are at risk for osteoporosis and fractures, ask your health care provider if you should be screened.  Ask your health care provider whether you should take a calcium or vitamin D supplement to lower your risk for osteoporosis.  Menopause may have certain physical symptoms and risks.  Hormone replacement therapy may reduce some of these symptoms and risks. Talk to your health care provider about whether hormone replacement therapy is right for you.  HOME CARE INSTRUCTIONS   Schedule regular health, dental, and eye exams.  Stay current with your immunizations.   Do not use any tobacco products including cigarettes, chewing tobacco, or electronic cigarettes.  If you are pregnant, do not drink alcohol.  If you are breastfeeding, limit how much and how often you  drink alcohol.  Limit alcohol intake to no more than 1 drink per day for nonpregnant women. One drink equals 12 ounces of beer, 5 ounces of wine, or 1 ounces of hard liquor.  Do not use street drugs.  Do not share needles.  Ask your health care provider for help if you need support or information about quitting drugs.  Tell your health care provider if you often feel depressed.  Tell your health care provider if you have ever been abused or do not feel safe at home. Document Released: 12/30/2010 Document Revised: 10/31/2013 Document Reviewed: 05/18/2013 [facility] Patient Information 2015 [place], [place]. This information is not intended to replace advice given to you by your health care provider. Make sure you discuss any questions you have with your health care provider.

## 2014-11-23 DIAGNOSIS — Z716 Tobacco abuse counseling: Secondary | ICD-10-CM | POA: Insufficient documentation

## 2014-11-23 NOTE — Assessment & Plan Note (Addendum)
Patient reported some increased activity and attempts to watch what she eats. A1c today was still very much elevated at 12.0 (resulted after visit). Further work needs to be done w/ this. Her DMII is uncontrolled. I will be asking her to see me again in 2-4 weeks, at this time I will try to make the necessary changes to her diabetes medications.  Patient has been hyperglycemic for a long time. If my initial changes do not make a difference than I will likely ask that she be seen by our pharmacist.

## 2014-11-23 NOTE — Progress Notes (Signed)
Patient ID: Kristen MireCarolyn Garrett, female   DOB: 12-01-1960, 54 y.o.   MRN: [id number]  Kristen HillierIan Amario Longmore, MD, MS Phone: [phone]  Subjective:  Chief complaint -- Annual Physical  Patient has no significant complaints at this time. She feels good, is active and is happy. She has been trying to be more active lately and watching what she eats. No recent illnesses, or changes in her life. She is joined by her daughter today. Her primary area of discussion was about her current willingness to quit smoking. She and her daughter asked great questions and she seemed very motivated to do this.     Colonoscopy: no, information provided by nursing at end of visit  Pneumonia Vaccine: no  Mammogram: no, information provided by nursing at end of visit  Menopause: she believes she is starting to have some symptoms.   Vaginal Bleeding: no  Tobacco Use: yes, we discussed quitting extensively   Alcohol Use: no  Other Drugs: no   Support Structure: yes, family  Life at Home: husband and daughter.  Review of Systems - General ROS: negative for - chills, fatigue, fever, night sweats, weight gain or weight loss Psychological ROS: negative for - anxiety, behavioral disorder, concentration difficulties, depression or hallucinations Ophthalmic ROS: negative for - blurry vision, double vision, excessive tearing or photophobia ENT ROS: negative for - epistaxis, headaches, hearing change, sore throat, vertigo or visual changes Respiratory ROS: no cough, shortness of breath, or wheezing Cardiovascular ROS: no chest pain or dyspnea on exertion Gastrointestinal ROS: no abdominal pain, change in bowel habits, or black or bloody stools Genito-Urinary ROS: no dysuria, trouble voiding, or hematuria Musculoskeletal ROS: negative  Past Medical History Patient Active Problem List   Diagnosis Date Noted  . Encounter for tobacco use cessation counseling 11/23/2014  . Edema of left lower extremity 09/18/2014  . Xerosis of  skin 07/13/2014  . Onychomycosis 07/13/2014  . Unspecified venous (peripheral) insufficiency 02/14/2013  . Bilateral calf pain 01/12/2013  . Diabetic peripheral neuropathy 11/03/2012  . Left breast lump 06/01/2012  . Enlarged thyroid 10/11/2011  . Left shoulder pain 10/11/2011  . ALLERGIC RHINITIS, SEASONAL 09/07/2009  . DM (diabetes mellitus), type 2, uncontrolled 08/27/2006  . HYPERCHOLESTEROLEMIA 08/27/2006  . TOBACCO DEPENDENCE 08/27/2006  . HYPERTENSION, BENIGN SYSTEMIC 08/27/2006    Medications- reviewed and updated Current Outpatient Prescriptions  Medication Sig Dispense Refill  . aspirin 81 MG chewable tablet Chew 81 mg by mouth daily.      Marland Kitchen. etodolac (LODINE) 500 MG tablet Take 1 tablet (500 mg total) by mouth 2 (two) times daily. 60 tablet 1  . gabapentin (NEURONTIN) 300 MG capsule Take 3 capsules (900 mg total) by mouth 3 (three) times daily. 60 capsule 0  . gabapentin (NEURONTIN) 300 MG capsule TAKE TWO CAPSULES BY MOUTH THREE TIMES DAILY 180 capsule 3  . glipiZIDE (GLUCOTROL) 10 MG tablet TAKE ONE TABLET BY MOUTH TWICE DAILY BEFORE MEAL(S) 60 tablet 3  . ibuprofen (ADVIL,MOTRIN) 200 MG tablet Take 3 tablets (600 mg total) by mouth every 6 (six) hours as needed. 90 tablet 1  . lisinopril (PRINIVIL,ZESTRIL) 20 MG tablet TAKE TWO TABLETS BY MOUTH ONCE DAILY 60 tablet 3  . metFORMIN (GLUCOPHAGE) 500 MG tablet Take 1 tablet (500 mg total) by mouth 3 (three) times daily. 180 tablet 3  . nicotine (NICODERM CQ - DOSED IN MG/24 HOURS) 21 mg/24hr patch Place 1 patch (21 mg total) onto the skin daily. 28 patch 2  . nicotine polacrilex (COMMIT) 4 MG lozenge  Take 1 lozenge (4 mg total) by mouth as needed for smoking cessation. 100 tablet 1  . terbinafine (LAMISIL) 250 MG tablet Take 1 tablet (250 mg total) by mouth daily. 30 tablet 2   No current facility-administered medications for this visit.    Objective: BP 136/80 mmHg  Pulse 90  Temp(Src) 98.3 F (36.8 C) (Oral)  Ht   (1.676 m)  Wt 197 lb 11.2 oz (89.676 kg)  BMI 31.92 kg/m2  LMP 06/30/2014 General -- oriented x3, pleasant and cooperative. HEENT -- Head is normocephalic. PERRLA. EOMI. Ears, nose and throat were benign. TMs benign Neck -- supple; no bruits. Chest -- good expansion. Lungs clear to auscultation. Cardiac -- RRR. No murmurs noted.  Abdomen -- Obese, soft, nontender. No masses palpable. Bowel sounds present. CNS -- cranial nerves II through XII grossly intact. 2+ reflexes bilaterally. Extremeties - Decreased flexibility in hamstrings. No tenderness or effusions noted. ROM good. 5/5 bilateral strength. Dorsalis pedis pulses present and symmetrical.    Assessment/Plan:  DM (diabetes mellitus), type 2, uncontrolled Patient reported some increased activity and attempts to watch what she eats. A1c today was still very much elevated at 12.0 (resulted after visit). Further work needs to be done w/ this. Her DMII is uncontrolled. I will be asking her to see me again in 2-4 weeks, at this time I will try to make the necessary changes to her diabetes medications.  Patient has been hyperglycemic for a long time. If my initial changes do not make a difference than I will likely ask that she be seen by our pharmacist.    Encounter for tobacco use cessation counseling Patient appeared very motivated to quit smoking. She had questions about methods and I was able to discuss the options she had. She made it VERY clear she did not want to use any medications other than nicotine replacement to quit. Typically smokes nearly a pack a day.  - Transdermal patches:  - Nicotine lozenge:  - I will try to call her one week from this visit to see if she has made any progress - unable to tell me a quit date at this time.      Orders Placed This Encounter  Procedures  . BASIC METABOLIC PANEL WITH GFR  . Lipid panel    Order Specific Question:  Has the patient fasted?    Answer:  No  . POCT HgB A1C     Meds ordered this encounter  Medications  . metFORMIN (GLUCOPHAGE) 500 MG tablet    Sig: Take 1 tablet (500 mg total) by mouth 3 (three) times daily.    Dispense:  180 tablet    Refill:  3  . nicotine (NICODERM CQ - DOSED IN MG/24 HOURS) 21 mg/24hr patch    Sig: Place 1 patch (21 mg total) onto the skin daily.    Dispense:  28 patch    Refill:  2  . nicotine polacrilex (COMMIT) 4 MG lozenge    Sig: Take 1 lozenge (4 mg total) by mouth as needed for smoking cessation.    Dispense:  100 tablet    Refill:  1

## 2014-11-23 NOTE — Assessment & Plan Note (Signed)
Patient appeared very motivated to quit smoking. She had questions about methods and I was able to discuss the options she had. She made it VERY clear she did not want to use any medications other than nicotine replacement to quit. Typically smokes nearly a pack a day.  - Transdermal patches: 21mg  - Nicotine lozenge: 4mg  - I will try to call her one week from this visit to see if she has made any progress - unable to tell me a quit date at this time.

## 2014-12-05 ENCOUNTER — Encounter: Payer: Self-pay | Admitting: Family Medicine

## 2014-12-29 ENCOUNTER — Other Ambulatory Visit: Payer: Self-pay | Admitting: Family Medicine

## 2014-12-29 DIAGNOSIS — B351 Tinea unguium: Secondary | ICD-10-CM

## 2014-12-29 NOTE — Telephone Encounter (Signed)
Requesting refill on antifungal pill for her toenails, Lamisil, pt goes to [facility]/[address]

## 2015-01-23 ENCOUNTER — Telehealth: Payer: Self-pay | Admitting: Family Medicine

## 2015-01-23 ENCOUNTER — Other Ambulatory Visit: Payer: Self-pay | Admitting: Family Medicine

## 2015-01-23 NOTE — Telephone Encounter (Signed)
Pt is calling to check on the status of this refill request. She states she is leaving out of town tomorrow and needs this completed at the earliest convenience. She is hoping for today. Sadie Reynolds, ASA

## 2015-01-31 ENCOUNTER — Ambulatory Visit: Payer: Self-pay | Admitting: Family Medicine

## 2015-01-31 NOTE — Telephone Encounter (Signed)
erroneous

## 2015-02-22 ENCOUNTER — Other Ambulatory Visit: Payer: Self-pay | Admitting: Family Medicine

## 2015-02-26 ENCOUNTER — Encounter: Payer: Self-pay | Admitting: Family Medicine

## 2015-02-26 ENCOUNTER — Ambulatory Visit (INDEPENDENT_AMBULATORY_CARE_PROVIDER_SITE_OTHER): Payer: Managed Care, Other (non HMO) | Admitting: Family Medicine

## 2015-02-26 VITALS — BP 132/78 | HR 79 | Temp 98.3°F | Ht 66.0 in | Wt 201.0 lb

## 2015-02-26 DIAGNOSIS — N923 Ovulation bleeding: Secondary | ICD-10-CM | POA: Insufficient documentation

## 2015-02-26 DIAGNOSIS — R1902 Left upper quadrant abdominal swelling, mass and lump: Secondary | ICD-10-CM

## 2015-02-26 DIAGNOSIS — N939 Abnormal uterine and vaginal bleeding, unspecified: Secondary | ICD-10-CM

## 2015-02-26 DIAGNOSIS — G8929 Other chronic pain: Secondary | ICD-10-CM | POA: Insufficient documentation

## 2015-02-26 DIAGNOSIS — E1165 Type 2 diabetes mellitus with hyperglycemia: Secondary | ICD-10-CM

## 2015-02-26 DIAGNOSIS — IMO0002 Reserved for concepts with insufficient information to code with codable children: Secondary | ICD-10-CM

## 2015-02-26 DIAGNOSIS — M549 Dorsalgia, unspecified: Secondary | ICD-10-CM

## 2015-02-26 LAB — POCT GLYCOSYLATED HEMOGLOBIN (HGB A1C): Hemoglobin A1C: 12.3

## 2015-02-26 MED ORDER — GLIPIZIDE 10 MG PO TABS: ORAL_TABLET | ORAL | Status: DC

## 2015-02-26 MED ORDER — GABAPENTIN 300 MG PO CAPS: 600.0000 mg | ORAL_CAPSULE | Freq: Three times a day (TID) | ORAL | Status: DC

## 2015-02-26 NOTE — Assessment & Plan Note (Signed)
Chronic back pain for nearly a year, causing pt to be out of work. Reports she has seen orthopedics, had MRI's, and completed physical therapy without relief. No red flags. Pt requesting pain medication today, and is not interested in muscle relaxers or tramadol. Advised against opioids for treatment of chronic pain. Recommended pt continue to use heating pad, as this does give her some relief. Offered referral to pain management, she will think about this.

## 2015-02-26 NOTE — Patient Instructions (Signed)
Diabetes: -stop metformin -increase glipizide to  three times daily -try to get new supplies to check your sugar -work on diet and exercise -refilled gabapentin -think about starting insulin  Back pain: -continue heat -can refer to pain management if you would like  Irregular bleeding: -checking pelvic ultrasound -also checking abdominal ultrasound to evaluate spleen -return for pelvic exam with Dr. Wende Mott  Be well, Dr. Pollie Meyer

## 2015-02-26 NOTE — Assessment & Plan Note (Addendum)
Poorly controlled. A1c today 12.3. Discussed with patient options, including my recommendation that she start insulin. She refuses insulin at this time, but states she will think about it. She plans to work on diet and exercise in the interim. Will increase glipizide to 10 mg 3 times a day. Stop metformin due to intolerable GI side effects. F/u with PCP in 3 months.  Cardiac: did not realize until after visit, that last lipid panel showed triglycerides in 800's. Called pt after visit to discuss and discovered that these lipids were drawn while NOT fasting. Pt will return for a fasting lipid panel. Order entered. Renal: on ACE Eye: within last year Foot: defer to future visit given complexity of other issues we discussed today.

## 2015-02-26 NOTE — Progress Notes (Signed)
Patient ID: Kristen Garrett, female   DOB: 1960/12/07, 54 y.o.   MRN: [id number]  HPI:  Diabetes - currently taking metformin  TID and glipizide  BID. not checking sugars at home. No concerns for low blood sugars. Out of work and has no insurance so unable to see nutritionist. Dislikes metformin due to GI upset. Does not want to go on insulin. Denies chest pain or shortness of breath.  Back pain - injured back at work September of last year. Was turning a patient at work and caused her to pull her back. Came out of work completely in May. Was seen at [facility]. Did physical therapy. Was told things had to heal. Was released to go back to work, but no sitting positions were available so she is now completely out of work. In pain sometimes. Takes ibuprofen intermittently, only when necessary. Requests pain medication today. Does not want any muscle relaxers, states these do not help her. Has tried tylenol, ibuprofen, tramadol without relief. Denies having fever, saddle anesthesia, lower extremity weakness, or problems with stooling or urination.  Menstrual cycle - had gone many months without a period but less than 1 year. Then had a 7 day period. Bleeding in between periods for the last 2 periods. Refuses pelvic exam today. Patient also notes a prominence of the left side of her abdomen, sees this when standing. Nontender. She thinks this is fat. No pelvic pain.  ROS: See HPI.  PMFSH: hx allergic rhinitis, type 2 diabetes, diabetic neuropathy, hyperlipidemia, hypertension  PHYSICAL EXAM: BP 132/78 mmHg  Pulse 79  Temp(Src) 98.3 F (36.8 C) (Oral)  Ht  (1.676 m)  Wt 201 lb (91.173 kg)  BMI 32.46 kg/m2  LMP 02/17/2015 Gen: No acute distress, pleasant, cooperative HEENT: Normocephalic, atraumatic Heart: Regular rate and rhythm, no murmur Lungs: Clear to auscultation bilaterally, normal respiratory effort Abdomen: Soft, nontender to palpation, no masses organomegaly. Upon  standing there is prominence of the left upper quadrant in her abdomen. No masses palpable in this area. GU: Patient refused pelvic exam as she is menstruating Neuro: Grossly nonfocal, speech normal Ext: No edema bilateral lower extremities  ASSESSMENT/PLAN:  DM (diabetes mellitus), type 2, uncontrolled Poorly controlled. A1c today 12.3. Discussed with patient options, including my recommendation that she start insulin. She refuses insulin at this time, but states she will think about it. She plans to work on diet and exercise in the interim. Will increase glipizide to 10 mg 3 times a day. Stop metformin due to intolerable GI side effects. F/u with PCP in 3 months.  Cardiac: did not realize until after visit, that last lipid panel showed triglycerides in 800's. Called pt after visit to discuss and discovered that these lipids were drawn while NOT fasting. Pt will return for a fasting lipid panel. Order entered. Renal: on ACE Eye: within last year Foot: defer to future visit given complexity of other issues we discussed today.  Chronic back pain Chronic back pain for nearly a year, causing pt to be out of work. Reports she has seen orthopedics, had MRI's, and completed physical therapy without relief. No red flags. Pt requesting pain medication today, and is not interested in muscle relaxers or tramadol. Advised against opioids for treatment of chronic pain. Recommended pt continue to use heating pad, as this does give her some relief. Offered referral to pain management, she will think about this.  Intermenstrual bleeding Discussed with pt that this most likely represents perimenopausal status, but that evaluation of  endometrium is warranted. She states she does NOT want biopsy but is willing to have pelvic ultrasound done. Will also u/s abdomen given prominence of LUQ, to evaluate for splenomegaly. Pt refused pelvic exam today due to currently menstruating. She will return to have this  done.   FOLLOW UP: F/u in next several weeks for pelvic exam F/u in 3 months with PCP for chronic ongoing issues  [place] J. Pollie Meyer, MD [facility]

## 2015-02-26 NOTE — Assessment & Plan Note (Addendum)
Discussed with pt that this most likely represents perimenopausal status, but that evaluation of endometrium is warranted. She states she does NOT want biopsy but is willing to have pelvic ultrasound done. Will also u/s abdomen given prominence of LUQ, to evaluate for splenomegaly. Pt refused pelvic exam today due to currently menstruating. She will return to have this done.

## 2015-03-02 ENCOUNTER — Ambulatory Visit (HOSPITAL_COMMUNITY): Payer: Self-pay

## 2015-03-12 ENCOUNTER — Telehealth: Payer: Self-pay | Admitting: Family Medicine

## 2015-03-12 NOTE — Telephone Encounter (Signed)
Forwarding to ordering physician.

## 2015-03-12 NOTE — Telephone Encounter (Signed)
[facility] called and would like to speak to the doctor about orders that were put in. Patient has an appointment later in the week. jw

## 2015-03-14 NOTE — Telephone Encounter (Signed)
Returned this call yesterday & clarified answers to their questions. Kristen Dodrill, MD

## 2015-03-15 ENCOUNTER — Ambulatory Visit (HOSPITAL_COMMUNITY)
Admission: RE | Admit: 2015-03-15 | Discharge: 2015-03-15 | Disposition: A | Discharge status: Home / Self Care | Payer: Self-pay | Source: Ambulatory Visit | Attending: Family Medicine | Admitting: Family Medicine

## 2015-03-15 DIAGNOSIS — N923 Ovulation bleeding: Secondary | ICD-10-CM | POA: Insufficient documentation

## 2015-03-15 DIAGNOSIS — E119 Type 2 diabetes mellitus without complications: Secondary | ICD-10-CM | POA: Insufficient documentation

## 2015-03-15 DIAGNOSIS — N939 Abnormal uterine and vaginal bleeding, unspecified: Secondary | ICD-10-CM | POA: Insufficient documentation

## 2015-03-15 DIAGNOSIS — R1902 Left upper quadrant abdominal swelling, mass and lump: Secondary | ICD-10-CM | POA: Insufficient documentation

## 2015-03-15 DIAGNOSIS — E785 Hyperlipidemia, unspecified: Secondary | ICD-10-CM | POA: Insufficient documentation

## 2015-03-16 ENCOUNTER — Telehealth: Payer: Self-pay | Admitting: Family Medicine

## 2015-03-16 DIAGNOSIS — Z13220 Encounter for screening for lipoid disorders: Secondary | ICD-10-CM

## 2015-03-16 NOTE — Telephone Encounter (Signed)
Called patient to discuss results of ultrasound.  1. Abdomen u/s showing likely fatty infiltration of liver - recommended patient return for LFT's (and fasting lipids) to eval liver further. Also recommended scheduling f/u appointment with PCP to discuss this further. With her hx of diabetes, she may warrant eventual referral to hepatologist but first needs basic liver labs. CMET entered.  2. Pelvic u/s with 23mm endometrial stripe. No upper limit of normal for someone who is pre- or perimenopausal. Discussed with patient that biopsy of her endometrium may be warranted, but that first we need to do pelvic exam (she had refused this during the visit). Recommended f/u appointment with PCP for pelvic exam & to discuss next steps.  Summary of recommendations: - return for fasting lab visit for CMET and lipids - follow up with PCP for fatty liver and dysfunctional uterine bleeding  FYI to PCP. Latrelle Dodrill, MD

## 2015-03-28 ENCOUNTER — Other Ambulatory Visit: Payer: Self-pay | Admitting: Family Medicine

## 2015-04-16 ENCOUNTER — Emergency Department (HOSPITAL_COMMUNITY)
Admission: EM | Admit: 2015-04-16 | Discharge: 2015-04-16 | Discharge status: Against Medical Advice | Payer: Self-pay | Attending: Emergency Medicine | Admitting: Emergency Medicine

## 2015-04-16 ENCOUNTER — Emergency Department (INDEPENDENT_AMBULATORY_CARE_PROVIDER_SITE_OTHER)
Admission: EM | Admit: 2015-04-16 | Discharge: 2015-04-16 | Disposition: A | Discharge status: Home / Self Care | Payer: Self-pay | Source: Home / Self Care | Attending: Family Medicine | Admitting: Family Medicine

## 2015-04-16 ENCOUNTER — Encounter (HOSPITAL_COMMUNITY): Payer: Self-pay | Admitting: Emergency Medicine

## 2015-04-16 DIAGNOSIS — E119 Type 2 diabetes mellitus without complications: Secondary | ICD-10-CM | POA: Insufficient documentation

## 2015-04-16 DIAGNOSIS — M549 Dorsalgia, unspecified: Secondary | ICD-10-CM | POA: Insufficient documentation

## 2015-04-16 DIAGNOSIS — R109 Unspecified abdominal pain: Secondary | ICD-10-CM | POA: Insufficient documentation

## 2015-04-16 DIAGNOSIS — K2970 Gastritis, unspecified, without bleeding: Secondary | ICD-10-CM

## 2015-04-16 DIAGNOSIS — Z720 Tobacco use: Secondary | ICD-10-CM | POA: Insufficient documentation

## 2015-04-16 LAB — POCT I-STAT, CHEM 8
BUN: 12 mg/dL (ref 6–20)
Calcium, Ion: 1.2 mmol/L (ref 1.12–1.23)
Chloride: 104 mmol/L (ref 101–111)
Creatinine, Ser: 0.6 mg/dL (ref 0.44–1.00)
Glucose, Bld: 293 mg/dL — ABNORMAL HIGH (ref 65–99)
HCT: 44 % (ref 36.0–46.0)
Hemoglobin: 15 g/dL (ref 12.0–15.0)
Potassium: 4.1 mmol/L (ref 3.5–5.1)
Sodium: 136 mmol/L (ref 135–145)
TCO2: 22 mmol/L (ref 0–100)

## 2015-04-16 MED ORDER — GI COCKTAIL ~~LOC~~
ORAL | Status: AC
  Filled 2015-04-16: qty 30

## 2015-04-16 MED ORDER — OMEPRAZOLE 20 MG PO CPDR: 20.0000 mg | DELAYED_RELEASE_CAPSULE | Freq: Every day | ORAL | Status: DC

## 2015-04-16 MED ORDER — GI COCKTAIL ~~LOC~~
30.0000 mL | Freq: Once | ORAL | Status: AC
  Administered 2015-04-16: 30 mL via ORAL

## 2015-04-16 NOTE — Discharge Instructions (Signed)

## 2015-04-16 NOTE — ED Notes (Signed)
Called pt to triage room without answer from lobby.

## 2015-04-16 NOTE — ED Notes (Signed)
Pt here with c/o progressive abdominal swelling with pain, intermit that started 3 dys ago 2 episodes of vomiting today after eating  Abdomen distended, tender Afebrile, BP elevated

## 2015-04-16 NOTE — ED Provider Notes (Signed)
CSN: [id number]     Arrival date & time 04/16/15  1855 History   First MD Initiated Contact with Patient 04/16/15 1928     No chief complaint on file.  (Consider location/radiation/quality/duration/timing/severity/associated sxs/prior Treatment) The history is provided by the patient.   this a 54 year old woman with known diabetes and hypertension who comes in with epigastric pain that radiates to her back. She's had this pain off and on for the last several days. Seems to come and go, relieved by vomiting on 2 occasions. She's had no fever, diarrhea, hematemesis, or hematochezia. She had a good bowel movement yesterday.  Patient does not complain of any urinary symptoms, cough, chest pain.  Past Medical History  Diagnosis Date  . Diabetes mellitus   . Hyperlipidemia   . Hyperlipidemia   . DVT of lower limb, acute Age 54    Went on blood thinner for a few months   Past Surgical History  Procedure Laterality Date  . Tubal ligation    . Endometrial ablation w/ novasure     Family History  Problem Relation Age of Onset  . Diabetes Mother   . Hypertension Mother   . Hyperlipidemia Mother   . Diabetes Father   . Hyperlipidemia Father    Social History  Substance Use Topics  . Smoking status: Current Every Day Smoker -- 0.50 packs/day  . Smokeless tobacco: Not on file     Comment: decreased smoking  . Alcohol Use: No   OB History    Gravida Para Term Preterm AB TAB SAB Ectopic Multiple Living   3 2 1 1      2      Review of Systems  Constitutional: Negative.   HENT: Negative.   Eyes: Negative.   Respiratory: Negative.   Cardiovascular: Negative.   Gastrointestinal: Positive for vomiting, abdominal pain and abdominal distention. Negative for diarrhea, constipation, blood in stool, anal bleeding and rectal pain.  Genitourinary: Negative.   Skin: Negative.   Psychiatric/Behavioral: Negative.     Allergies  Review of patient's allergies indicates no known  allergies.  Home Medications   Prior to Admission medications   Medication Sig Start Date End Date Taking? Authorizing Provider  aspirin 81 MG chewable tablet Chew 81 mg by mouth daily.      Historical Provider, MD  gabapentin (NEURONTIN) 300 MG capsule Take 2 capsules (600 mg total) by mouth 3 (three) times daily. 02/26/15   Latrelle DodrillBrittany J McIntyre, MD  glipiZIDE (GLUCOTROL) 10 MG tablet TAKE ONE TABLET BY MOUTH THREE TIMES DAILY BEFORE MEAL(S) 02/26/15   Latrelle DodrillBrittany J McIntyre, MD  ibuprofen (ADVIL,MOTRIN) 200 MG tablet Take 3 tablets (600 mg total) by mouth every 6 (six) hours as needed. 07/27/13   Garnetta BuddyEdward Williamson V, MD  lisinopril (PRINIVIL,ZESTRIL) 20 MG tablet TAKE TWO TABLETS BY MOUTH ONCE DAILY 03/28/15   Kathee DeltonIan D McKeag, MD  nicotine (NICODERM CQ - DOSED IN MG/24 HOURS) 21 mg/24hr patch Place 1 patch (21 mg total) onto the skin daily. 11/20/14   Kathee DeltonIan D McKeag, MD  nicotine polacrilex (COMMIT) 4 MG lozenge Take 1 lozenge (4 mg total) by mouth as needed for smoking cessation. 11/20/14   Kathee DeltonIan D McKeag, MD   Meds Ordered and Administered this Visit  Medications - No data to display  BP 176/99 mmHg  Pulse 87  Temp(Src) 98.9 F (37.2 C) (Oral)  Resp 16  SpO2 99% No data found.   Physical Exam  Constitutional: She is oriented to person, place, and time.  She appears well-developed and well-nourished.  HENT:  Head: Normocephalic and atraumatic.  Right Ear: External ear normal.  Left Ear: External ear normal.  Mouth/Throat: Oropharynx is clear and moist.  Eyes: Conjunctivae and EOM are normal. Pupils are equal, round, and reactive to light.  Neck: Normal range of motion. Neck supple.  Cardiovascular: Normal rate, regular rhythm, normal heart sounds and intact distal pulses.   Pulmonary/Chest: Effort normal and breath sounds normal.  Abdominal: Soft. Bowel sounds are normal. She exhibits distension. She exhibits no mass. There is tenderness. There is no rebound and no guarding.  Musculoskeletal:  Normal range of motion.  Neurological: She is alert and oriented to person, place, and time.  Skin: Skin is warm and dry.  Psychiatric: She has a normal mood and affect. Her behavior is normal. Judgment and thought content normal.  Nursing note and vitals reviewed.   ED Course  Procedures (including critical care time) Results for orders placed or performed during the hospital encounter of 04/16/15  I-STAT, chem 8  Result Value Ref Range   Sodium 136 135 - 145 mmol/L   Potassium 4.1 3.5 - 5.1 mmol/L   Chloride 104 101 - 111 mmol/L   BUN 12 6 - 20 mg/dL   Creatinine, Ser 5.57 0.44 - 1.00 mg/dL   Glucose, Bld 322 (H) 65 - 99 mg/dL   Calcium, Ion 0.25 1.12 - 1.23 mmol/L   TCO2 22 0 - 100 mmol/L   Hemoglobin 15.0 12.0 - 15.0 g/dL   HCT 42.7 06.2 - 37.6 %    MDM   Assessment:  Acute gastritis in diabetic woman.  She may be developing pancreatitis.  At this point I will encourage diet change, PPI use.  If pain persists, she will need to follow up with PCP  Elvina Sidle, MD  Elvina Sidle, MD 04/16/15 [phone]

## 2015-04-27 ENCOUNTER — Other Ambulatory Visit: Payer: Self-pay | Admitting: Family Medicine

## 2015-06-22 ENCOUNTER — Telehealth: Payer: Self-pay | Admitting: Family Medicine

## 2015-06-22 MED ORDER — GABAPENTIN 300 MG PO CAPS: 600.0000 mg | ORAL_CAPSULE | Freq: Three times a day (TID) | ORAL | Status: DC

## 2015-06-22 NOTE — Telephone Encounter (Signed)
Patient calling after hours line, forgot to request refill of gabapentin.  Refilled for 1 month supply.  Joanna Puffrystal S. Nadina Fomby, MD [facility] Resident  06/22/2015, 4:57 PM

## 2015-07-20 ENCOUNTER — Ambulatory Visit (INDEPENDENT_AMBULATORY_CARE_PROVIDER_SITE_OTHER): Payer: Self-pay | Admitting: Family Medicine

## 2015-07-20 ENCOUNTER — Encounter: Payer: Self-pay | Admitting: Family Medicine

## 2015-07-20 VITALS — BP 149/69 | HR 96 | Temp 98.8°F | Ht 66.0 in | Wt 190.1 lb

## 2015-07-20 DIAGNOSIS — E785 Hyperlipidemia, unspecified: Secondary | ICD-10-CM

## 2015-07-20 DIAGNOSIS — B351 Tinea unguium: Secondary | ICD-10-CM

## 2015-07-20 DIAGNOSIS — IMO0001 Reserved for inherently not codable concepts without codable children: Secondary | ICD-10-CM

## 2015-07-20 DIAGNOSIS — E1165 Type 2 diabetes mellitus with hyperglycemia: Secondary | ICD-10-CM

## 2015-07-20 LAB — POCT GLYCOSYLATED HEMOGLOBIN (HGB A1C)

## 2015-07-20 MED ORDER — TERBINAFINE HCL 250 MG PO TABS: 250.0000 mg | ORAL_TABLET | Freq: Every day | ORAL | Status: DC

## 2015-07-20 MED ORDER — LISINOPRIL 20 MG PO TABS: 40.0000 mg | ORAL_TABLET | Freq: Every day | ORAL | Status: DC

## 2015-07-20 MED ORDER — GABAPENTIN 300 MG PO CAPS: 600.0000 mg | ORAL_CAPSULE | Freq: Three times a day (TID) | ORAL | Status: DC

## 2015-07-20 MED ORDER — ATORVASTATIN CALCIUM 40 MG PO TABS: 40.0000 mg | ORAL_TABLET | Freq: Every day | ORAL | Status: DC

## 2015-07-20 NOTE — Assessment & Plan Note (Signed)
Worsening: Patient was presented today for a diabetic check. A1c today is >15. Patient states that she has discontinued her metformin and glipizide secondary to side effects of severe diarrhea. Patient has a symptomatic at this time and endorses complete asymptomatic status since the discontinuation of these medications.  - Due to patient's lack of insurance I will not be initiating any new therapies at this time, as patient was not comfortable with initiation of injectable insulin today. - I have asked patient to set up an appointment with Dr. Raymondo Band for discussion of management options for her uncontrolled diabetes. She is aware that at that appointment she may have to initiate injectable insulin.

## 2015-07-20 NOTE — Assessment & Plan Note (Signed)
New/recurrent: Patient endorses new onset of toenail discoloration of her great toes bilaterally insistent with recurrent onychomycoses. - Initiated terbinafine therapy.

## 2015-07-20 NOTE — Progress Notes (Signed)
HPI  CC: Diabetes follow-up Patient is here for diabetes follow-up. She states that she is had significant side effects from her glipizide and metformin. She endorses significants diarrhea and nausea whenever she takes as medications and thus has stopped taking these medications over the past month. Since discontinuing these medications she states that she feels "great". At this time she endorses complete asymptomatic status. She endorses some concern over her diabetes control but is adamant that she no longer wants to experience any symptoms which she had been experiencing in the past. Diabetic foot exam performed today.  Onychomycoses: Patient states that she has had some darkening of the skin and discoloration of the lateral aspect of her great toes. This area has no pain but she feels as though it is causing cosmetic discomfort to the patient. No history of trauma. No erythema or discharge. Patient has been afebrile.  Hyperlipidemia: Patient I discussed her previous lipid panel which did not been addressed in the past. This showed substantial elevation in cholesterol. We discussed the pros and cons of initiating statin therapy.   ROS: Patient denies fever, chills, shortness of breath, chest pain, dizziness, vision changes, vomiting, abdominal pain, dysuria, weakness, numbness, or worsening paresthesias.  Past medical history and social history reviewed and updated in the EMR as appropriate.  Objective: BP 149/69 mmHg  Pulse 96  Temp(Src) 98.8 F (37.1 C) (Oral)  Ht  (1.676 m)  Wt 190 lb 1.6 oz (86.229 kg)  BMI 30.70 kg/m2  LMP 02/17/2015 Gen: NAD, alert, cooperative, and pleasant. HEENT: NCAT, EOMI, PERRL CV: RRR, no murmur Resp: CTAB, no wheezes, non-labored Ext: No edema, warm, peripheral pulses intact throughout. Darkening of skin of the lateral aspect of bilateral great toes as well as discoloration to the ipsilateral aspect of the nailbed. No tenderness to palpation, no  evidence of discharge or purulence. Neuro: Alert and oriented, Speech clear, No gross deficits  Assessment and plan:  DM (diabetes mellitus), type 2, uncontrolled Worsening: Patient was presented today for a diabetic check. A1c today is >15. Patient states that she has discontinued her metformin and glipizide secondary to side effects of severe diarrhea. Patient has a symptomatic at this time and endorses complete asymptomatic status since the discontinuation of these medications.  - Due to patient's lack of insurance I will not be initiating any new therapies at this time, as patient was not comfortable with initiation of injectable insulin today. - I have asked patient to set up an appointment with Dr. Raymondo Band for discussion of management options for her uncontrolled diabetes. She is aware that at that appointment she may have to initiate injectable insulin.  Onychomycosis New/recurrent: Patient endorses new onset of toenail discoloration of her great toes bilaterally insistent with recurrent onychomycoses. - Initiated terbinafine therapy.  Hyperlipidemia Stable/untreated: Patient had a lipid panel performed approximately 6 months ago. This panel yielded a result of hyperlipidemia with a total cholesterol of 231 and a HDL of 28. Patient was not initiated on any statin therapy at that time. Due patient's diabetic status I believe it is imperative that she initiate statin therapy immediately. - Atorvastatin 40 mg daily. - We'll follow-up with patient's tolerance of this medication.    Orders Placed This Encounter  Procedures  . HgB A1c    Meds ordered this encounter  Medications  . terbinafine (LAMISIL) 250 MG tablet    Sig: Take 1 tablet (250 mg total) by mouth daily.    Dispense:  42 tablet  Refill:  0  . atorvastatin (LIPITOR) 40 MG tablet    Sig: Take 1 tablet (40 mg total) by mouth daily.    Dispense:  90 tablet    Refill:  3  . lisinopril (PRINIVIL,ZESTRIL) 20 MG tablet      Sig: Take 2 tablets (40 mg total) by mouth daily.    Dispense:  60 tablet    Refill:  5  . gabapentin (NEURONTIN) 300 MG capsule    Sig: Take 2 capsules (600 mg total) by mouth 3 (three) times daily.    Dispense:  180 capsule    Refill:  5     Kathee Delton, MD,MS,  PGY2 07/20/2015 5:20 PM

## 2015-07-20 NOTE — Assessment & Plan Note (Signed)
Stable/untreated: Patient had a lipid panel performed approximately 6 months ago. This panel yielded a result of hyperlipidemia with a total cholesterol of 231 and a HDL of 28. Patient was not initiated on any statin therapy at that time. Due patient's diabetic status I believe it is imperative that she initiate statin therapy immediately. - Atorvastatin 40 mg daily. - We'll follow-up with patient's tolerance of this medication.

## 2015-07-20 NOTE — Patient Instructions (Signed)
It was a pleasure seeing you today in our clinic. Today we discussed your diabetes. Here is the treatment plan we have discussed and agreed upon together:   - I would like to have you see Dr. Raymondo Band, to go over your diabetes control.  - Call if you have any change in your health or have any questions.

## 2015-08-02 ENCOUNTER — Encounter: Payer: Self-pay | Admitting: Pharmacist

## 2015-08-02 ENCOUNTER — Ambulatory Visit (INDEPENDENT_AMBULATORY_CARE_PROVIDER_SITE_OTHER): Payer: Self-pay | Admitting: Pharmacist

## 2015-08-02 VITALS — BP 131/80 | HR 86 | Wt 188.7 lb

## 2015-08-02 DIAGNOSIS — F17200 Nicotine dependence, unspecified, uncomplicated: Secondary | ICD-10-CM

## 2015-08-02 DIAGNOSIS — IMO0001 Reserved for inherently not codable concepts without codable children: Secondary | ICD-10-CM

## 2015-08-02 DIAGNOSIS — E1165 Type 2 diabetes mellitus with hyperglycemia: Secondary | ICD-10-CM

## 2015-08-02 DIAGNOSIS — I10 Essential (primary) hypertension: Secondary | ICD-10-CM

## 2015-08-02 MED ORDER — RELION LANCETS ULTRA-THIN 30G MISC: 1.0000 | Freq: Every day | Status: DC

## 2015-08-02 MED ORDER — RELION ULTIMA GLUCOSE SYSTEM W/DEVICE KIT: 1.0000 | PACK | Freq: Every day | Status: AC

## 2015-08-02 MED ORDER — RELION LANCING DEVICE KIT: 1.0000 | PACK | Freq: Every day | Status: AC

## 2015-08-02 MED ORDER — INSULIN DETEMIR 100 UNIT/ML FLEXPEN: 10.0000 [IU] | PEN_INJECTOR | Freq: Every day | SUBCUTANEOUS | Status: DC

## 2015-08-02 MED ORDER — GLUCOSE BLOOD VI STRP: ORAL_STRIP | Status: AC

## 2015-08-02 NOTE — Progress Notes (Signed)
S:    Patient arrives apprehensive and tearful.  Reports she is upset and scared about high blood sugars.  Patient presents with her daughter Kristen Garrett.  Presents for diabetes evaluation, education, and management at the request of Dr. Wende Garrett. Patient was referred on 07/20/15.  Patient was last seen by Primary Care Provider on 07/20/15.   Patient reports Diabetes was diagnosed over 20 years ago.   Patient denies adherence with medications.  Patient reports taking the following medications: aspirin 81 mg daily, lisinopril 40 mg daily, and gabapentin 600 mg TID Current diabetes medications include: glipizide and metformin - reports she stopped taking these medications due to diarrhea  Current hypertension medications include: lisinopril  daily   Patient is not currently monitoring blood glucose at home as she reports she does not have a meter.    Patient reported dietary habits: Eats 3 meals/day Breakfast: 2 servings of oatmeal  Lunch: sandwich, banana Dinner: meat generally baked, starch (mashed potato or rice), vegetable (kale, spinach, green beans, collard greens, peas) Snacks: snacks on junk food throughout the day (chips or skins)  Drinks: water, diet coke   Patient reported exercise habits: walks every morning for 30 minutes    Patient reports nocturia (1 to 2 times per night).  Patient denies neuropathy.  Reports neuropathy is controlled with gabapentin.   Patient denies visual changes. Patient reports self foot exams.    O:  Blood glucose in office today = 421 mg/dL   Lab Results  Component Value Date   HGBA1C >15.0 07/20/2015   There were no vitals filed for this visit.  Home fasting CBG: patient is not currently monitoring    A/P: Diabetes longstanding currently uncontrolled. Patient denies hypoglycemic events and is able to verbalize appropriate hypoglycemia management plan.  Patient denies adherence with medications (metformin and glipizide) due to diarrhea. Control is  suboptimal due to nonadherence with medications and diet.  Patient was very hesitant to start insulin therapy.  Discussed multiple alternative treatment options with patient.  Patient refused trial of metformin XR or any sulfonylurea.  Discussed use of SGLT2 and GLP-1 agonist but cost is a limitation as patient does not have insurance.  Patient just started a new job and should be getting insurance in 3 to 4 months.  Following extensive education and review patient agreed to start insulin therapy today.  Started basal insulin Levemir (insulin determir) 10 units daily.  Counseled patient on purpose, proper use, and adverse effects of insulin therapy.  Provided patient with a sample of Levemir and patient administered first dose of Levemir 10 units in the office.  Patient will continue to titrate 1 unit/day if fasting CBGs > /dl until fasting CBGs reach goal or next visit.  Patient to begin monitoring blood glucose once daily.  Counseled patient on s/sx of hypoglycemia and appropriate management.  Next A1C anticipated in 2 to 3 months.    Hypertension currently controlled.  Patient reports adherence with medication.   Tobacco dependence.  Patient reports smoking 15 cigarettes per day.  Has quit date set for 08/06/15.  Patient reports buying 21 mg nicotine patches recently.  Counseled patient on purpose, proper use, and adverse effects of nicotine replacement products.  Provided information on 1 800-QUIT NOW support program.   Written patient instructions provided.  Total time in face to face counseling 120 minutes.  Will call patient in one week.  Follow up in Pharmacist Clinic Visit in one month.   Patient seen with Kristen Garrett, Pharm.D.

## 2015-08-02 NOTE — Assessment & Plan Note (Signed)
Tobacco dependence.  Patient reports smoking 15 cigarettes per day.  Has quit date set for 08/06/15.  Patient reports buying 21 mg nicotine patches recently.  Counseled patient on purpose, proper use, and adverse effects of nicotine replacement products.  Provided information on 1 800-QUIT NOW support program.

## 2015-08-02 NOTE — Assessment & Plan Note (Signed)
Diabetes longstanding currently uncontrolled. Patient denies hypoglycemic events and is able to verbalize appropriate hypoglycemia management plan.  Patient denies adherence with medications (metformin and glipizide) due to diarrhea. Control is suboptimal due to nonadherence with medications and diet.  Patient was very hesitant to start insulin therapy.  Discussed multiple alternative treatment options with patient.  Patient refused trial of metformin XR or any sulfonylurea.  Discussed use of SGLT2 and GLP-1 agonist but cost is a limitation as patient does not have insurance.  Patient just started a new job and should be getting insurance in 3 to 4 months.  Following extensive education and review patient agreed to start insulin therapy today.  Started basal insulin Levemir (insulin determir) 10 units daily.  Counseled patient on purpose, proper use, and adverse effects of insulin therapy.  Provided patient with a sample of Levemir and patient administered first dose of Levemir 10 units in the office.  Patient will continue to titrate 1 unit/day if fasting CBGs > /dl until fasting CBGs reach goal or next visit.  Patient to begin monitoring blood glucose once daily.  Counseled patient on s/sx of hypoglycemia and appropriate management.  Next A1C anticipated in 2 to 3 months.

## 2015-08-02 NOTE — Assessment & Plan Note (Signed)
Hypertension currently controlled.  Patient reports adherence with medication.

## 2015-08-02 NOTE — Patient Instructions (Addendum)
Thank you for coming in today!  Start taking Levemir 10 units once a day.  Increase Levemir dose by 1 unit per day if your daily blood sugar is more than /dl.    Please pick up Relion glucometer at [facility] pharmacy and start checking your blood glucose once a day.    I will call you in one week.    Please schedule a follow up appointment with Dr. Raymondo Band in one month.

## 2015-08-06 NOTE — Progress Notes (Signed)
Patient ID: Kristen Garrett, female   DOB: Nov 07, 1960, 55 y.o.   MRN: [id number] Reviewed: Agree with Dr. Macky Lower documentation and management.

## 2015-08-09 ENCOUNTER — Telehealth: Payer: Self-pay | Admitting: Pharmacist

## 2015-08-09 NOTE — Telephone Encounter (Signed)
S:    Phone call by Lilla Shook, PharmD Resident to patient to follow up on insulin titration and blood glucose.    Patient reports adherence with medications.   Current diabetes medications include: Levemir 16 units daily (patient reports compliance with titration instructions of increasing Levemir by 1 unit per day if fasting CBGs > /dL)    Patient denies hypoglycemic events.  However, she reports waking up feeling shaky one night and reports symptoms improved after eating half of a sandwich.  Patient did not monitor blood sugar at this time.    Patient reports she is currently checking blood glucose once daily between 6 and 7 PM prior to insulin injection.    O:  Lab Results  Component Value Date   HGBA1C >15.0 07/20/2015   Home CBG: 397, 344, 401, 408, 386, 314    A/P: Diabetes longstanding currently uncontrolled.  Patient denies hypoglycemic events and is able to verbalize appropriate hypoglycemia management plan. Patient reports adherence with medication. Control is suboptimal due to medication optimization.  Increased dose of basal insulin Lantus (insulin glargine) to 22 units daily. Patient will continue to titrate 1 unit/day if fasting CBGs > /dl until fasting CBGs reach goal or next visit.  Patient to begin monitoring blood glucose in the morning to determine fasting CBG.  Discussed appropriate hypoglycemia management including monitoring blood glucose if she experiences s/sx of hypoglycemia.  Next A1C anticipated in 2 to 3 months.  Follow up telephone call in one week.

## 2015-08-15 ENCOUNTER — Telehealth: Payer: Self-pay | Admitting: Pharmacist

## 2015-08-15 NOTE — Telephone Encounter (Signed)
Taken by Lilla Shook - PharmD  Patient with need for additional Levemir samples - blood sugars are improved and dose increases continue.    Medication Samples have been provided to the patient.  Drug name: Levemir  Qty: 2  LOT: [id number]  Exp.Date: 08/2016 Left in refrigerator for pick-up. The patient has been instructed regarding the correct time, dose, and frequency of taking this medication, including desired effects and most common side effects.   Kathrin Ruddy 1:59 PM 08/15/2015

## 2015-08-23 ENCOUNTER — Telehealth: Payer: Self-pay | Admitting: Pharmacist

## 2015-08-23 DIAGNOSIS — E1165 Type 2 diabetes mellitus with hyperglycemia: Principal | ICD-10-CM

## 2015-08-23 DIAGNOSIS — IMO0001 Reserved for inherently not codable concepts without codable children: Secondary | ICD-10-CM

## 2015-08-23 MED ORDER — INSULIN PEN NEEDLE 32G X 4 MM MISC: 1.0000 | Freq: Every day | Status: DC

## 2015-08-23 NOTE — Telephone Encounter (Signed)
S:    Phone call by Lilla Shook, Pharm.D. Resident to patient to follow up on insulin titration and blood glucose.    Patient reports adherence with medications.  Current diabetes medications include: Levemir 33 units daily (patient reports compliance with titration instructions of increasing Levemir by 1 unit per day if fasting CBGs > 100 mg/dL)  Patient denies hypoglycemic events.   O:  Lab Results  Component Value Date   HGBA1C >15.0 07/20/2015   Home fasting CBG: 297 to 360 mg/dL    A/P: Diabetes longstanding currently uncontrolled. Patient denies hypoglycemic events and is able to verbalize appropriate hypoglycemia management plan. Patient reports adherence with medication. Control is suboptimal due to medication optimization.  Increased dose of basal insulin Lantus (insulin glargine) to 38 units daily. Patient will continue to titrate 1 unit/day if fasting CBGs > /dl until fasting CBGs reach goal or next visit.  Follow up in Pharmacist Clinic Visit in one week.

## 2015-08-30 ENCOUNTER — Encounter: Payer: Self-pay | Admitting: Pharmacist

## 2015-08-30 ENCOUNTER — Ambulatory Visit (INDEPENDENT_AMBULATORY_CARE_PROVIDER_SITE_OTHER): Payer: Self-pay | Admitting: Pharmacist

## 2015-08-30 DIAGNOSIS — IMO0001 Reserved for inherently not codable concepts without codable children: Secondary | ICD-10-CM

## 2015-08-30 DIAGNOSIS — E049 Nontoxic goiter, unspecified: Secondary | ICD-10-CM

## 2015-08-30 DIAGNOSIS — E1165 Type 2 diabetes mellitus with hyperglycemia: Secondary | ICD-10-CM

## 2015-08-30 LAB — COMPREHENSIVE METABOLIC PANEL
ALT: 20 U/L (ref 6–29)
AST: 16 U/L (ref 10–35)
Albumin: 3.5 g/dL — ABNORMAL LOW (ref 3.6–5.1)
Alkaline Phosphatase: 104 U/L (ref 33–130)
BUN: 23 mg/dL (ref 7–25)
CHLORIDE: 104 mmol/L (ref 98–110)
CO2: 23 mmol/L (ref 20–31)
CREATININE: 0.65 mg/dL (ref 0.50–1.05)
Calcium: 9 mg/dL (ref 8.6–10.4)
Glucose, Bld: 351 mg/dL — ABNORMAL HIGH (ref 65–99)
Potassium: 4.5 mmol/L (ref 3.5–5.3)
SODIUM: 135 mmol/L (ref 135–146)
Total Bilirubin: 0.3 mg/dL (ref 0.2–1.2)
Total Protein: 6.1 g/dL (ref 6.1–8.1)

## 2015-08-30 LAB — TSH: TSH: 0.69 mIU/L

## 2015-08-30 MED ORDER — INSULIN GLARGINE 100 UNIT/ML SOLOSTAR PEN: 50.0000 [IU] | PEN_INJECTOR | Freq: Every day | SUBCUTANEOUS | Status: DC

## 2015-08-30 NOTE — Assessment & Plan Note (Signed)
Diabetes longstanding diagnosed currently improved. Patient denies hypoglycemic events and is able to verbalize appropriate hypoglycemia management plan. Patient reports adherence with medication. Control is suboptimal due to inadequate insulin dose.  Increased dose of basal insulin Lantus (insulin glargine) to 50 units. Patient will continue to titrate 1 unit/day if fasting CBGs > /dl until fasting CBGs reach goal or next visit.  Provided patient with samples of Lantus and gave her pharmacy coupon for 30 day free trial of Lantus.  Counseled patient on purpose, proper use, and adverse effects of insulin therapy.  Patient does not currently have insurance.  She started a new job at the end of January 2017 and reports she will be eligible for insurance after she has worked for 90 days.  Next A1C anticipated in 2 months.

## 2015-08-30 NOTE — Patient Instructions (Signed)
Thank you for coming in today!   Start taking Lantus 50 units once a day.  Increase Lantus dose by 1 unit per day if your blood sugar is more than /dL.   I will call you in one week.    Follow up with Dr. Wende Mott in 3 to 4 weeks and with Dr. Raymondo Band in one month.

## 2015-08-30 NOTE — Progress Notes (Signed)
S:    Patient arrives apprehensive.  Patient presents with her daughter [name].  Presents for diabetes evaluation, education, and management at the request of Dr. Wende Mott. Patient was referred on 07/20/15.  Patient was last seen by Primary Care Provider on 07/20/15.   Patient reports Diabetes was diagnosed over 20 years ago.   Patient reports adherence with medications.  Current diabetes medications include: Levemir 43 units daily Current hypertension medications include: lisinopril 40 mg daily  Patient denies hypoglycemic events.  Patient reported dietary habits: Eats 3 meals/day Reports she has cut carbs out of her diet, increased vegetables, meat, salads Breakfast: 2 servings of oatmeal Lunch: sandwich with sugar free bread Dinner: meat, vegetables Snacks: nuts, skins, yogurt, banana Drinks: water, diet soda, diet cranberry juice, [place] 50 orange juice occasionally   Patient reported exercise habits: walking most days of the week    Patient denies nocturia (1 time per night).  Patient reports neuropathy. Patient denies visual changes. Patient reports self foot exams.    O:  Lab Results  Component Value Date   HGBA1C >15.0 07/20/2015   There were no vitals filed for this visit.  Home fasting CBG: 234 to 300s mg/dL  10 year ASCVD risk: 40.9%  A/P: Diabetes longstanding diagnosed currently improved. Patient denies hypoglycemic events and is able to verbalize appropriate hypoglycemia management plan. Patient reports adherence with medication. Control is suboptimal due to inadequate insulin dose.  Increased dose of basal insulin Lantus (insulin glargine) to 50 units. Patient will continue to titrate 1 unit/day if fasting CBGs > /dl until fasting CBGs reach goal or next visit.  Provided patient with samples of Lantus and gave her pharmacy coupon for 30 day free trial of Lantus.  Counseled patient on purpose, proper use, and adverse effects of insulin therapy.  Patient does  not currently have insurance.  She started a new job at the end of January 2017 and reports she will be eligible for insurance after she has worked for 90 days.  Next A1C anticipated in 2 months.    Hypertension currently controlled.  Patient reports adherence with medication.    ASCVD risk greater than 7.5%. Continued Aspirin 81 mg.  Patient reports she is not taking atorvastatin 40 mg daily as prescribed.  Reevaluate at next clinic visit.    Written patient instructions provided.  Total time in face to face counseling 30 minutes.   Follow up in Pharmacist Clinic Visit in one month.   Patient seen with Philmore Pali, PharmD Candidate, Lisette Grinder, PharmD Resident, and Lilla Shook, PharmD Resident.

## 2015-08-31 NOTE — Progress Notes (Signed)
Patient ID: Kristen Garrett, female   DOB: 1961-01-19, 55 y.o.   MRN: [id number] Reviewed: Agree with Dr. Macky LowerKoval's documentation and management.

## 2015-09-06 ENCOUNTER — Telehealth: Payer: Self-pay | Admitting: Pharmacist

## 2015-09-06 NOTE — Telephone Encounter (Signed)
S:  Phone call to patient to follow up on insulin titration and blood glucose.  Patient answered the phone sounding upbeat and reports she is doing wonderful.    Patient reports adherence with medications.   Current diabetes medications include: Lantus 52 units daily (patient reports adherence with titration instructions of increasing Lantus by 1 unit per day if fasting CBGs > 150 mg/dL)  Patient denies hypoglycemic events.     O: Home fasting CBG: 175 to 245 mg/dL.    A/P: Diabetes longstanding currently improved.  Patient denies hypoglycemic events and is able to verbalize appropriate hypoglycemia management plan.  Patient reports adherence with medication.  Control is suboptimal due to inadequate insulin dose.  Patient to continue to titrate 1 unit/day if fasting CBGs >150 mg/dL until fasting CBGs reach goal or next visit.  Follow up telephone call in 2 weeks.

## 2015-09-14 ENCOUNTER — Telehealth: Payer: Self-pay | Admitting: Pharmacist

## 2015-09-14 NOTE — Telephone Encounter (Signed)
[facility] Dawn Pettus reported that patient was trying to reach Lilla ShookRachel Henderson who is off today.  Her blood sugars were reported to be 150-170.   Follow up call - left message. No changes suggested - plan more phone f/u by [name] in the near future.

## 2015-09-20 ENCOUNTER — Ambulatory Visit (INDEPENDENT_AMBULATORY_CARE_PROVIDER_SITE_OTHER): Payer: Self-pay | Admitting: Pharmacist

## 2015-09-20 ENCOUNTER — Encounter: Payer: Self-pay | Admitting: Pharmacist

## 2015-09-20 VITALS — BP 152/79 | HR 79 | Wt 201.1 lb

## 2015-09-20 DIAGNOSIS — IMO0001 Reserved for inherently not codable concepts without codable children: Secondary | ICD-10-CM

## 2015-09-20 DIAGNOSIS — E1165 Type 2 diabetes mellitus with hyperglycemia: Secondary | ICD-10-CM

## 2015-09-20 DIAGNOSIS — E785 Hyperlipidemia, unspecified: Secondary | ICD-10-CM

## 2015-09-20 DIAGNOSIS — I10 Essential (primary) hypertension: Secondary | ICD-10-CM

## 2015-09-20 MED ORDER — CANAGLIFLOZIN 100 MG PO TABS: 100.0000 mg | ORAL_TABLET | Freq: Every day | ORAL | Status: DC

## 2015-09-20 MED ORDER — INSULIN GLARGINE 100 UNIT/ML SOLOSTAR PEN: 50.0000 [IU] | PEN_INJECTOR | Freq: Every day | SUBCUTANEOUS | Status: DC

## 2015-09-20 NOTE — Assessment & Plan Note (Signed)
ASCVD risk greater than 7.5%. Continued Aspirin 81 mg.  Patient reports she is not taking atorvastatin 40 mg daily as prescribed due to cost of atorvastatin.  Will re-evaluate initiation of atorvastatin when patient gets insurance through her work.

## 2015-09-20 NOTE — Assessment & Plan Note (Signed)
Hypertension currently not at goal.  Patient reports adherence with medication. Will re-evaluated at next visit, potential for improvement with BP when using canagliflozin.

## 2015-09-20 NOTE — Assessment & Plan Note (Signed)
Diabetes longstanding currently improved. Patient denies hypoglycemic events and is able to verbalize appropriate hypoglycemia management plan. Patient reports adherence with medication. Control is suboptimal due to optimization of medications.  Continued basal insulin Lantus (insulin glargine) 50 units daily.  Patient is very hesitant to increase insulin dose any further.  Initiated Invokana 100 mg daily.  Counseled patient on purpose, proper use, and adverse effects of Invokana.  Provided patient with coupon card for 30-day free trial.  Next A1C anticipated in 1-2 months.

## 2015-09-20 NOTE — Progress Notes (Signed)
Patient ID: Kristen Garrett, female   DOB: 12/06/1960, 54 y.o.   MRN: [id number] Reviewed: Agree with Dr. Koval's documentation and management. 

## 2015-09-20 NOTE — Progress Notes (Signed)
S:    Patient arrives in good spirits, ambulating without assistance.  Patient presents to her appointment with her daughter Okey RegalCarol.  Presents for diabetes evaluation, education, and management at the request of Dr. Wende MottMckeag. Patient was referred on 07/20/15.  Patient was last seen by Primary Care Provider on 07/20/15.   Patient reports Diabetes was diagnosed over 20 years ago.   Patient reports adherence with medications.  Current diabetes medications include: Lantus 50 units daily.   She continues to take an excessive amount of time to administer insulin daily (> 1 hour total).  Current hypertension medications include: lisinopril 40 mg daily  Patient denies hypoglycemic events.  Patient denies nocturia (1x per night).  Patient reports neuropathy. Patient denies visual changes. Patient reports self foot exams.    O:  Lab Results  Component Value Date   HGBA1C >15.0 07/20/2015   There were no vitals filed for this visit.  Home fasting CBG: 150 to 248 mg/dL  (checks BG in the evening before dinner)  10 year ASCVD risk: >30%.   A/P:   Diabetes longstanding currently improved. Patient denies hypoglycemic events and is able to verbalize appropriate hypoglycemia management plan. Patient reports adherence with medication. Control is suboptimal due to optimization of medications.  Continued basal insulin Lantus (insulin glargine) 50 units daily.  Patient is very hesitant to increase insulin dose any further.  Initiated Invokana 100 mg daily.  Counseled patient on purpose, proper use, and adverse effects of Invokana.  Provided patient with coupon card for 30-day free trial.  Next A1C anticipated in 1-2 months.    ASCVD risk greater than 7.5%. Continued Aspirin 81 mg.  Patient reports she is not taking atorvastatin 40 mg daily as prescribed due to cost of atorvastatin.  Will re-evaluate initiation of atorvastatin when patient gets insurance through her work.    Hypertension currently not at  goal.  Patient reports adherence with medication. Will re-evaluated at next visit, potential for improvement with BP when using canagliflozin.   Written patient instructions provided.  Total time in face to face counseling 30 minutes.  Follow up in Pharmacist Clinic Visit in 1 month.  Will follow up with patient telephonically in one week.   Patient seen with Philmore PaliPatrick Kurunwune, PharmD Candidate, Lisette GrinderAlyson Leonard, PharmD Resident, and [name], PharmD Resident.

## 2015-09-20 NOTE — Patient Instructions (Signed)
Thank you for coming in today!  Continue taking Lantus 50 units daily.   Start taking Invokana 100 mg daily.  Take the coupon card with you to the pharmacy.    Schedule a follow up appointment with Dr. Raymondo BandKoval in the Pharmacy clinic in one month.

## 2015-09-27 ENCOUNTER — Telehealth: Payer: Self-pay | Admitting: Pharmacist

## 2015-09-27 NOTE — Telephone Encounter (Signed)
S:    Phone call by [name], PharmD Resident to patient to follow up on initiation of Invokana and blood glucose.  Patient answered the phone and reports she is doing okay.  Patient reports she has had a rough week.  Reports her stomach is sore from the insulin injections.    Patient reports adherence with medications except denies taking Lantus yesterday evening.  Current diabetes medications include: Lantus 50 units daily and Invokana 100 mg daily   Patient confirms initiation of Invokana and denies any adverse effects since initiation.    Patient denies hypoglycemic events.   O:  Lab Results  Component Value Date   HGBA1C >15.0 07/20/2015   Home CBG: 145-230 mg/dL   A/P: Diabetes longstanding currently with improved control.  Patient denies hypoglycemic events and is able to verbalize appropriate hypoglycemia management plan.  Patient reports adherence with medication but denies taking Lantus yesterday because she was sore.  Discussed importance of adherence with Lantus.  Advised patient to continue to rotate injection site and discussed alternate injection sites such as top outer area of thighs and upper outer area of arms.  Continued basal insulin Lantus (insulin glargine) 50 units daily.  Continued Invokana 100 mg daily.  Follow up telephone call in one week.

## 2015-10-03 ENCOUNTER — Telehealth: Payer: Self-pay | Admitting: Pharmacist

## 2015-10-03 NOTE — Telephone Encounter (Signed)
S: Received phone call from Rogelia Mirearolyn Boakye.    Patient reports adherence with Lantus but denies adherence with Invokana.  Current diabetes medications include: Lantus 50 units daily, Invokana 100 mg daily (stopped 3 days ago) Patient reports discontinuation of Invokana three days ago due to lower back pain.  Patient reports she increased water intake, and reports pain has resolved.    Patient denies hypoglycemic events.   Patient reported dietary habits: reports eating more sweet and soda   O: Home CBG: 200s  A/P: Diabetes longstanding currently uncontrolled.  Patient denies hypoglycemic events and is able to verbalize appropriate hypoglycemia management plan.  Patient reports adherence with Lantus but reports she stopped taking Invokana 3 days ago due to lower back pain.  Offered patient appointment with Dr. Raymondo BandKoval on 10/04/15 or [facility], but patient declined and reports pain has resolved.  Advised patient to stop Invokana 100 mg daily and continue to drink water.  Control is suboptimal due to optimization of medications and increased sweets and soda in diet.    Discussed titration of Lantus; however, patient remains very hesitant and resistant to increase insulin dose above 50 units daily.  Patient wishes to work on diet and exercise at this time.  Continued basal insulin Lantus (insulin glargine) 50 units daily.  Follow up visit with Dr. Raymondo BandKoval on 10/18/15.

## 2015-10-08 ENCOUNTER — Encounter (HOSPITAL_COMMUNITY): Payer: Self-pay | Admitting: *Deleted

## 2015-10-08 ENCOUNTER — Telehealth: Payer: Self-pay | Admitting: Pharmacist

## 2015-10-08 ENCOUNTER — Ambulatory Visit (HOSPITAL_COMMUNITY)
Admission: EM | Admit: 2015-10-08 | Discharge: 2015-10-08 | Disposition: A | Discharge status: Home / Self Care | Payer: Self-pay | Attending: Emergency Medicine | Admitting: Emergency Medicine

## 2015-10-08 DIAGNOSIS — M545 Low back pain, unspecified: Secondary | ICD-10-CM

## 2015-10-08 LAB — POCT URINALYSIS DIP (DEVICE)
BILIRUBIN URINE: NEGATIVE
Glucose, UA: >=1000 mg/dL — AB
Ketones, ur: NEGATIVE mg/dL
LEUKOCYTES UA: NEGATIVE
NITRITE: NEGATIVE
Protein, ur: NEGATIVE mg/dL
SPECIFIC GRAVITY, URINE: 1.015 (ref 1.005–1.030)
Urobilinogen, UA: 0.2 mg/dL (ref 0.0–1.0)
pH: 6 (ref 5.0–8.0)

## 2015-10-08 MED ORDER — NAPROXEN SODIUM 550 MG PO TABS: 550.0000 mg | ORAL_TABLET | Freq: Two times a day (BID) | ORAL | Status: DC

## 2015-10-08 MED ORDER — CYCLOBENZAPRINE HCL 10 MG PO TABS: 10.0000 mg | ORAL_TABLET | Freq: Two times a day (BID) | ORAL | Status: DC | PRN

## 2015-10-08 NOTE — Telephone Encounter (Addendum)
Received a phone call from patient regarding UTI symptoms.  Ad[facility]  Provided patient with phone number [facility] to [facility].

## 2015-10-08 NOTE — ED Provider Notes (Signed)
CSN: [id number]     Arrival date & time 10/08/15  1300 History   First MD Initiated Contact with Patient 10/08/15 1308     Chief Complaint  Patient presents with  . Back Pain   (Consider location/radiation/quality/duration/timing/severity/associated sxs/prior Treatment) HPI Pin in right flank for several days. No dysuria. Pr stopped taking med about 1 week ago. States that she knows this med can cause kidney infection.  Also states taht the height of her work station at her job does change. She denies no dysuria, hematuria.  Past Medical History  Diagnosis Date  . Diabetes mellitus   . Hyperlipidemia   . Hyperlipidemia   . DVT of lower limb, acute ([place]) Age 55    Went on blood thinner for a few months   Past Surgical History  Procedure Laterality Date  . Tubal ligation    . Endometrial ablation w/ novasure     Family History  Problem Relation Age of Onset  . Diabetes Mother   . Hypertension Mother   . Hyperlipidemia Mother   . Diabetes Father   . Hyperlipidemia Father    Social History  Substance Use Topics  . Smoking status: Current Every Day Smoker -- 0.75 packs/day    Types: Cigarettes  . Smokeless tobacco: None     Comment: decreased smoking  . Alcohol Use: No   OB History    Gravida Para Term Preterm AB TAB SAB Ectopic Multiple Living   '3 2 1 1      2     '$ Review of Systems  Allergies  Glipizide and Metformin and related  Home Medications   Prior to Admission medications   Medication Sig Start Date End Date Taking? Authorizing Provider  acetaminophen (TYLENOL) 650 MG CR tablet Take 1,300 mg by mouth daily as needed for pain.    Historical Provider, MD  aspirin 81 MG chewable tablet Chew 81 mg by mouth daily.      Historical Provider, MD  atorvastatin (LIPITOR) 40 MG tablet Take 1 tablet (40 mg total) by mouth daily. Patient not taking: Reported on 08/02/2015 07/20/15   Elberta Leatherwood, MD  Blood Glucose Monitoring Suppl ([place]) w/Device  KIT 1 Device by Does not apply route daily. 08/02/15   Zenia Resides, MD  canagliflozin (INVOKANA) 100 MG TABS tablet Take 1 tablet (100 mg total) by mouth daily before breakfast. 09/20/15   Zenia Resides, MD  gabapentin (NEURONTIN) 300 MG capsule Take 2 capsules (600 mg total) by mouth 3 (three) times daily. 07/20/15   Elberta Leatherwood, MD  glucose blood (RELION GLUCOSE TEST STRIPS) test strip Use as instructed 08/02/15   Zenia Resides, MD  Insulin Glargine (LANTUS SOLOSTAR) 100 UNIT/ML Solostar Pen Inject 50 Units into the skin daily at 10 pm. 09/20/15   Zenia Resides, MD  Insulin Pen Needle (NOVOFINE PLUS) 32G X 4 MM MISC 1 Container by Does not apply route daily. 08/23/15   Zenia Resides, MD  Lancets Misc. (RELION LANCING DEVICE) KIT 1 Device by Does not apply route daily. 08/02/15   Zenia Resides, MD  lisinopril (PRINIVIL,ZESTRIL) 20 MG tablet Take 2 tablets (40 mg total) by mouth daily. 07/20/15   Elberta Leatherwood, MD  Multiple Vitamin (MULTIVITAMIN) capsule Take 1 capsule by mouth daily.    Historical Provider, MD  nicotine (NICODERM CQ - DOSED IN MG/24 HOURS) 21 mg/24hr patch Place 1 patch (21 mg total) onto the skin daily. Patient not  taking: Reported on 08/02/2015 11/20/14   Elberta Leatherwood, MD  nicotine polacrilex (COMMIT) 4 MG lozenge Take 1 lozenge (4 mg total) by mouth as needed for smoking cessation. Patient not taking: Reported on 08/02/2015 11/20/14   Elberta Leatherwood, MD  ReliOn Ultra Thin Lancets MISC 1 Device by Does not apply route daily. 08/02/15   Zenia Resides, MD  terbinafine (LAMISIL) 250 MG tablet Take 1 tablet (250 mg total) by mouth daily. Patient not taking: Reported on 08/02/2015 07/20/15   Elberta Leatherwood, MD  vitamin C (ASCORBIC ACID) 500 MG tablet Take 500 mg by mouth daily.    Historical Provider, MD   Meds Ordered and Administered this Visit  Medications - No data to display  BP 150/76 mmHg  Pulse 85  Temp(Src) 97.9 F (36.6 C) (Oral)  Resp 16  SpO2 99%  LMP 02/17/2015 No  data found.   Physical Exam NURSES NOTES AND VITAL SIGNS REVIEWED. CONSTITUTIONAL: Well developed, well nourished, no acute distress HEENT: normocephalic, atraumatic EYES: Conjunctiva normal NECK:normal ROM, supple, no adenopathy PULMONARY:No respiratory distress, normal effort MUSCULOSKELETAL: Normal ROM of all extremities, Right flank tenderness to palpation.  SKIN: warm and dry without rash PSYCHIATRIC: Mood and affect, behavior are normal   ED Course  Procedures (including critical care time)  Labs Review Labs Reviewed  POCT URINALYSIS DIP (DEVICE)    Imaging Review No results found.   Visual Acuity Review  Right Eye Distance:   Left Eye Distance:   Bilateral Distance:    Right Eye Near:   Left Eye Near:    Bilateral Near:     Review of UA is negative  Rx anaprox and flexeril Discussed case with care team pharmacy care team. It is their opinion that the medication is not the culprit at this time. UA is negative. Medication can cause urinary reflux. Pt has opted to stop this medication. I have advised pt that her symptoms are most likely from a musculoskeletal origin.     MDM   1. Right-sided low back pain without sciatica     Patient is reassured that there are no issues that require transfer to higher level of care at this time or additional tests. Patient is advised to continue home symptomatic treatment. Patient is advised that if there are new or worsening symptoms to attend the emergency department, contact primary care provider, or return to UC. Instructions of care provided discharged home in stable condition.    THIS NOTE WAS GENERATED USING A VOICE RECOGNITION SOFTWARE PROGRAM. ALL REASONABLE EFFORTS  WERE MADE TO PROOFREAD THIS DOCUMENT FOR ACCURACY.  I have verbally reviewed the discharge instructions with the patient. A printed AVS was given to the patient.  All questions were answered prior to discharge.      Konrad Felix, PA 10/08/15  1505

## 2015-10-08 NOTE — ED Notes (Signed)
Pt  Reports   She    reports pain  r  Side       X  1  Week     Pt  denys  Any  Injury  She reports   Pain started  After  She  Started  Taking   invakunna

## 2015-10-08 NOTE — Discharge Instructions (Signed)
Back Pain, Adult °Back pain is very common in adults. The cause of back pain is rarely dangerous and the pain often gets better over time. The cause of your back pain may not be known. Some common causes of back pain include: °1. Strain of the muscles or ligaments supporting the spine. °2. Wear and tear (degeneration) of the spinal disks. °3. Arthritis. °4. Direct injury to the back. °For many people, back pain may return. Since back pain is rarely dangerous, most people can learn to manage this condition on their own. °HOME CARE INSTRUCTIONS °Watch your back pain for any changes. The following actions may help to lessen any discomfort you are feeling: °1. Remain active. It is stressful on your back to sit or stand in one place for long periods of time. Do not sit, drive, or stand in one place for more than 30 minutes at a time. Take short walks on even surfaces as soon as you are able. Try to increase the length of time you walk each day. °2. Exercise regularly as directed by your health care provider. Exercise helps your back heal faster. It also helps avoid future injury by keeping your muscles strong and flexible. °3. Do not stay in bed. Resting more than 1-2 days can delay your recovery. °4. Pay attention to your body when you bend and lift. The most comfortable positions are those that put less stress on your recovering back. Always use proper lifting techniques, including: °1. Bending your knees. °2. Keeping the load close to your body. °3. Avoiding twisting. °5. Find a comfortable position to sleep. Use a firm mattress and lie on your side with your knees slightly bent. If you lie on your back, put a pillow under your knees. °6. Avoid feeling anxious or stressed. Stress increases muscle tension and can worsen back pain. It is important to recognize when you are anxious or stressed and learn ways to manage it, such as with exercise. °7. Take medicines only as directed by your health care provider.  Over-the-counter medicines to reduce pain and inflammation are often the most helpful. Your health care provider may prescribe muscle relaxant drugs. These medicines help dull your pain so you can more quickly return to your normal activities and healthy exercise. °8. Apply ice to the injured area: °1. Put ice in a plastic bag. °2. Place a towel between your skin and the bag. °3. Leave the ice on for 20 minutes, 2-3 times a day for the first 2-3 days. After that, ice and heat may be alternated to reduce pain and spasms. °9. Maintain a healthy weight. Excess weight puts extra stress on your back and makes it difficult to maintain good posture. °SEEK MEDICAL CARE IF: °1. You have pain that is not relieved with rest or medicine. °2. You have increasing pain going down into the legs or buttocks. °3. You have pain that does not improve in one week. °4. You have night pain. °5. You lose weight. °6. You have a fever or chills. °SEEK IMMEDIATE MEDICAL CARE IF:  °1. You develop new bowel or bladder control problems. °2. You have unusual weakness or numbness in your arms or legs. °3. You develop nausea or vomiting. °4. You develop abdominal pain. °5. You feel faint. °  °This information is not intended to replace advice given to you by your health care provider. Make sure you discuss any questions you have with your health care provider. °  °Document Released: 06/16/2005 Document Revised: 07/07/2014 Document Reviewed: 10/18/2013 °Elsevier Interactive Patient Education ©2016 [employer]   Inc[employer] ° °Back Exercises °The following exercises strengthen the muscles that help to support the back. They also help to keep the lower back flexible. Doing these exercises can help to prevent back pain or lessen existing pain. °If you have back pain or discomfort, try doing these exercises 2-3 times each day or as told by your health care provider. When the pain goes away, do them once each day, but increase the number of times that you repeat the  steps for each exercise (do more repetitions). If you do not have back pain or discomfort, do these exercises once each day or as told by your health care provider. °EXERCISES °Single Knee to Chest °Repeat these steps 3-5 times for each leg: °5. Lie on your back on a firm bed or the floor with your legs extended. °6. Bring one knee to your chest. Your other leg should stay extended and in contact with the floor. °7. Hold your knee in place by grabbing your knee or thigh. °8. Pull on your knee until you feel a gentle stretch in your lower back. °9. Hold the stretch for 10-30 seconds. °10. Slowly release and straighten your leg. °Pelvic Tilt °Repeat these steps 5-10 times: °10. Lie on your back on a firm bed or the floor with your legs extended. °11. Bend your knees so they are pointing toward the ceiling and your feet are flat on the floor. °12. Tighten your lower abdominal muscles to press your lower back against the floor. This motion will tilt your pelvis so your tailbone points up toward the ceiling instead of pointing to your feet or the floor. °13. With gentle tension and even breathing, hold this position for 5-10 seconds. °Cat-Cow °Repeat these steps until your lower back becomes more flexible: °7. Get into a hands-and-knees position on a firm surface. Keep your hands under your shoulders, and keep your knees under your hips. You may place padding under your knees for comfort. °8. Let your head hang down, and point your tailbone toward the floor so your lower back becomes rounded like the back of a cat. °9. Hold this position for 5 seconds. °10. Slowly lift your head and point your tailbone up toward the ceiling so your back forms a sagging arch like the back of a cow. °11. Hold this position for 5 seconds. °Press-Ups °Repeat these steps 5-10 times: °6. Lie on your abdomen (face-down) on the floor. °7. Place your palms near your head, about shoulder-width apart. °8. While you keep your back as relaxed as  possible and keep your hips on the floor, slowly straighten your arms to raise the top half of your body and lift your shoulders. Do not use your back muscles to raise your upper torso. You may adjust the placement of your hands to make yourself more comfortable. °9. Hold this position for 5 seconds while you keep your back relaxed. °10. Slowly return to lying flat on the floor. °Bridges °Repeat these steps 10 times: °1. Lie on your back on a firm surface. °2. Bend your knees so they are pointing toward the ceiling and your feet are flat on the floor. °3. Tighten your buttocks muscles and lift your buttocks off of the floor until your waist is at almost the same height as your knees. You should feel the muscles working in your buttocks and the back of your thighs. If you do not feel these muscles, slide your feet 1-2 inches farther away from your buttocks. °4. Hold this   position for 3-5 seconds. °5. Slowly lower your hips to the starting position, and allow your buttocks muscles to relax completely. °If this exercise is too easy, try doing it with your arms crossed over your chest. °Abdominal Crunches °Repeat these steps 5-10 times: °1. Lie on your back on a firm bed or the floor with your legs extended. °2. Bend your knees so they are pointing toward the ceiling and your feet are flat on the floor. °3. Cross your arms over your chest. °4. Tip your chin slightly toward your chest without bending your neck. °5. Tighten your abdominal muscles and slowly raise your trunk (torso) high enough to lift your shoulder blades a tiny bit off of the floor. Avoid raising your torso higher than that, because it can put too much stress on your low back and it does not help to strengthen your abdominal muscles. °6. Slowly return to your starting position. °Back Lifts °Repeat these steps 5-10 times: °1. Lie on your abdomen (face-down) with your arms at your sides, and rest your forehead on the floor. °2. Tighten the muscles in your  legs and your buttocks. °3. Slowly lift your chest off of the floor while you keep your hips pressed to the floor. Keep the back of your head in line with the curve in your back. Your eyes should be looking at the floor. °4. Hold this position for 3-5 seconds. °5. Slowly return to your starting position. °SEEK MEDICAL CARE IF: °· Your back pain or discomfort gets much worse when you do an exercise. °· Your back pain or discomfort does not lessen within 2 hours after you exercise. °If you have any of these problems, stop doing these exercises right away. Do not do them again unless your health care provider says that you can. °SEEK IMMEDIATE MEDICAL CARE IF: °· You develop sudden, severe back pain. If this happens, stop doing the exercises right away. Do not do them again unless your health care provider says that you can. °  °This information is not intended to replace advice given to you by your health care provider. Make sure you discuss any questions you have with your health care provider. °  °Document Released: 07/24/2004 Document Revised: 03/07/2015 Document Reviewed: 08/10/2014 °Elsevier Interactive Patient Education ©2016 Elsevier Inc. ° °

## 2015-10-18 ENCOUNTER — Encounter: Payer: Self-pay | Admitting: Pharmacist

## 2015-10-18 ENCOUNTER — Ambulatory Visit (INDEPENDENT_AMBULATORY_CARE_PROVIDER_SITE_OTHER): Payer: Self-pay | Admitting: Pharmacist

## 2015-10-18 VITALS — BP 132/76 | HR 83 | Wt 196.3 lb

## 2015-10-18 DIAGNOSIS — IMO0001 Reserved for inherently not codable concepts without codable children: Secondary | ICD-10-CM

## 2015-10-18 DIAGNOSIS — E1165 Type 2 diabetes mellitus with hyperglycemia: Secondary | ICD-10-CM

## 2015-10-18 MED ORDER — CANAGLIFLOZIN 100 MG PO TABS: 100.0000 mg | ORAL_TABLET | Freq: Every day | ORAL | Status: DC

## 2015-10-18 MED ORDER — INSULIN GLARGINE 100 UNIT/ML SOLOSTAR PEN: 55.0000 [IU] | PEN_INJECTOR | Freq: Every day | SUBCUTANEOUS | Status: DC

## 2015-10-18 MED ORDER — INSULIN DEGLUDEC 100 UNIT/ML ~~LOC~~ SOPN: 55.0000 [IU] | PEN_INJECTOR | Freq: Every day | SUBCUTANEOUS | Status: DC

## 2015-10-18 NOTE — Progress Notes (Signed)
S:    Patient arrives in fair spirits, ambulating without assistance.  Patient presents with her daughter, Okey RegalCarol.  Presents for diabetes evaluation, education, and management at the request of Dr.McKeag.  Patient was referred on 07/20/15.  Patient was last seen by Primary Care Provider on 07/20/15.  No complaints of urogenital symptoms of any type at this visit.   Patient reports Diabetes was diagnosed over 20 years ago.   Patient reports adherence with medications.  Current diabetes medications include: Lantus, Invokana Current hypertension medications include: lisinopril   Patient denies hypoglycemic events.  Patient reported dietary habits: Eats 3 meals/day Breakfast: oatmeal Lunch: sandwich Dinner: full course meal  Snacks: walnuts, cheese, crackers, banana, yogurt, occasional Twix and rinds Drinks: water  Patient reported exercise habits:     Patient denies nocturia (1x per night).  Patient denies neuropathy. Patient denies visual changes. Patient reports self foot exams.    O:  Lab Results  Component Value Date   HGBA1C >15.0 07/20/2015   Home fasting CBG: 100s  10 year ASCVD risk: >30%.  A/P: Diabetes longstanding diagnosed currently improved. Patient denies hypoglycemic events and is able to verbalize appropriate hypoglycemia management plan. Patient reports adherence with medication.  Continued basal insulin Lantus (insulin glargine) or Tresiba  55 units daily. Successfully administered dose of Tresiba  Continued Invokana 100 mg daily.  Next A1C anticipated at next visit.   ASCVD risk greater than 7.5%. Continued Aspirin 81 mg.  Will plan to re-initiate atorvastatin following orange card evaluation.    Hypertension currently at goal.  Patient reports adherence with medication.   Written patient instructions provided.  Total time in face to face counseling 30 minutes.  Follow up with Dr. Wende MottMckeag in one week.  Patient seen with Philmore PaliPatrick Kurunwune, PharmD Candidate, Arcola JanskyMeagan  Decker, PharmD Resident, and [name], PharmD Resident.

## 2015-10-18 NOTE — Patient Instructions (Addendum)
Thank you for coming in today!    Continue Invokana 100 mg daily. Continue Lantus 55 units daily.  Once you are out of Lantus, start taking Tresiba 55 units daily.      Follow up with Dr. Wende MottMcKeag in one week.   Schedule an appointment to be assessed for an orange card.

## 2015-10-18 NOTE — Assessment & Plan Note (Signed)
Diabetes longstanding diagnosed currently improved. Patient denies hypoglycemic events and is able to verbalize appropriate hypoglycemia management plan. Patient reports adherence with medication.  Continued basal insulin Lantus (insulin glargine) or Tresiba  55 units daily. Successfully administered dose of Tresiba  Continued Invokana 100 mg daily.  Next A1C anticipated at next visit.   ASCVD risk greater than 7.5%. Continued Aspirin 81 mg.  Will plan to re-initiate atorvastatin following orange card evaluation.

## 2015-10-18 NOTE — Progress Notes (Addendum)
   Subjective:    Patient ID: Kristen Garrett, female   Kristen Garrett DOB: 01-19-1961, 55 y.o.   MRN: [id number]  HPI Reviewed: Agree with the documentation and management of Dr. Raymondo BandKoval.     Review of Systems     Objective:   Physical Exam        Assessment & Plan:

## 2015-10-19 ENCOUNTER — Telehealth: Payer: Self-pay | Admitting: Pharmacist

## 2015-10-19 NOTE — Telephone Encounter (Signed)
S:    Received a phone call from patient.  Patient reports concern regarding blood glucose of 128 mg/dL today.  Patient asked whether she should take [place]iba today and whether she should continue same dose.   Patient reports adherence with medications.  Current diabetes medications include: Graylon Gunningresiba, Invokana   Patient denies hypoglycemic events.   O:  Lab Results  Component Value Date   HGBA1C >15.0 07/20/2015   Home fasting CBG: 128 mg/dL  A/P: Diabetes longstanding currently improved. Patient denies hypoglycemic events and is able to verbalize appropriate hypoglycemia management plan.  Patient reports adherence with medication.  Reviewed glycemic goal of preprandial blood glucose of 80 to 130 mg/dL. Advised patient to continue Tresiba 55 units daily.  Counseled patient to reduce Tresiba to 50 units daily if she begins to have blood glucose < 100 mg/dL.  Patient to follow up with Dr. Wende MottMckeag as scheduled on 10/25/15.

## 2015-10-25 ENCOUNTER — Ambulatory Visit (INDEPENDENT_AMBULATORY_CARE_PROVIDER_SITE_OTHER): Payer: Self-pay | Admitting: Family Medicine

## 2015-10-25 ENCOUNTER — Encounter: Payer: Self-pay | Admitting: Family Medicine

## 2015-10-25 VITALS — BP 143/80 | HR 98 | Temp 98.9°F | Ht 66.5 in | Wt 196.8 lb

## 2015-10-25 DIAGNOSIS — B351 Tinea unguium: Secondary | ICD-10-CM

## 2015-10-25 DIAGNOSIS — F17200 Nicotine dependence, unspecified, uncomplicated: Secondary | ICD-10-CM

## 2015-10-25 DIAGNOSIS — IMO0001 Reserved for inherently not codable concepts without codable children: Secondary | ICD-10-CM

## 2015-10-25 DIAGNOSIS — E785 Hyperlipidemia, unspecified: Secondary | ICD-10-CM

## 2015-10-25 DIAGNOSIS — E1165 Type 2 diabetes mellitus with hyperglycemia: Secondary | ICD-10-CM

## 2015-10-25 LAB — POCT GLYCOSYLATED HEMOGLOBIN (HGB A1C): HEMOGLOBIN A1C: 11.8

## 2015-10-25 MED ORDER — TERBINAFINE HCL 250 MG PO TABS: 250.0000 mg | ORAL_TABLET | Freq: Every day | ORAL | Status: DC

## 2015-10-25 MED ORDER — INSULIN DEGLUDEC 100 UNIT/ML ~~LOC~~ SOPN: 55.0000 [IU] | PEN_INJECTOR | Freq: Every day | SUBCUTANEOUS | Status: DC

## 2015-10-25 MED ORDER — PRAVASTATIN SODIUM 40 MG PO TABS: 40.0000 mg | ORAL_TABLET | Freq: Every day | ORAL | Status: DC

## 2015-10-25 NOTE — Assessment & Plan Note (Addendum)
Improved: A1c 11.8 today. This is dramatically improved from the previous A1c of >15. Patient reports good compliance with her current medication regimen as well as good tolerance to this medication. Morning CBGs in the 120s to 180s. For now I am okay with these values as I believe patient has been hyperglycemic for so long that she should not be consistently dropped to a "normal" CBG until we make sure she can tolerate it. Pharmacy, Dr. Raymondo BandKoval, was able to see patient at the end of our visit to discuss her medication regimen. He was able to supply her with 3 additional Tresiba sample pens. I would like to see her back in 3 months. - Continue current regimen.  Diabetes - much improved control with use of insulin.  Appears to be successfully administering 55 units daily of Tresiba.   Given symptoms of possible low CBGs and fasting readings reported to be in the low 100s we reduced the Guinea-Bissauresiba to 50 units daily.    3 pens of insulin provided from sample supply.

## 2015-10-25 NOTE — Patient Instructions (Signed)
It was a pleasure seeing you today in our clinic. Today we discussed your diabetes and toes. Here is the treatment plan we have discussed and agreed upon together:   - I have prescribed the terbinafine once more (since the previous prescription was not taken).  - Most severe other medications do not require a refill until July. At that time I will refill these medications. - I've changed your statin from atorvastatin to pravastatin. Take this once a day everyday.

## 2015-10-25 NOTE — Assessment & Plan Note (Signed)
Stable: Patient had been placed on atorvastatin. Unfortunately she states that this medication is too expensive for her and is asking if she has less expensive options. I firmly believe that a patient with her risk factors should be on any statin they're able to tolerate. - Discontinue atorvastatin secondary to cost - New prescription for pravastatin placed.

## 2015-10-25 NOTE — Progress Notes (Addendum)
HPI  CC: Diabetes and medication follow-up. Patient is here for a follow-up on her diabetes. She states that she has been doing well with her new medication regimen. Her daily CBGs have been ranging in the 120s to 180s. She states that this is significantly improved from what they had been in the past. She states that she feels that her and has no complaints at this time. She denies any symptoms of hypoglycemia. Patient is pleased with the direction her diabetes is going.  Patient is also asking for medication refills at this time. She states that she is no longer taking her atorvastatin secondary to cost. Patient tonight had a long discussion about the prescription for terbinafine that I had provided at our previous visit. Patient continues to have onychomycoses of her toenails and would like treatment for this. Unfortunately she was scared after reading the side effects of terbinafine that she threw away the previous prescription before taking any. We discussed some of these rare side effects and the relative low risk of these effects when used short-term. Patient felt better after this discussion. She would also like another prescription for terbinafine as she had thrown out the previous prescription after reading the side effects.  ROS: Patient denies headache, fever, chills, dysphagia, nausea, vomiting, diarrhea, chest pain, shortness of breath, dysuria, weakness, numbness, lightheadedness.  CC, SH/smoking status, and VS noted  Objective: BP 143/80 mmHg  Pulse 98  Temp(Src) 98.9 F (37.2 C) (Oral)  Ht 5' 6.5" (1.689 m)  Wt 196 lb 12.8 oz (89.268 kg)  BMI 31.29 kg/m2  SpO2 100%  LMP 02/17/2015 Gen: NAD, alert, cooperative, and pleasant. CV: RRR Resp: non-labored Ext: No edema, warm Neuro: Alert and oriented, Speech clear, No gross deficits  Assessment and plan:  DM (diabetes mellitus), type 2, uncontrolled (HCC) Improved: A1c 11.8 today. This is dramatically improved from the  previous A1c of >15. Patient reports good compliance with her current medication regimen as well as good tolerance to this medication. Morning CBGs in the 120s to 180s. For now I am okay with these values as I believe patient has been hyperglycemic for so long that she should not be consistently dropped to a "normal" CBG until we make sure she can tolerate it. Pharmacy, Dr. Raymondo BandKoval, was able to see patient at the end of our visit to discuss her medication regimen. He was able to supply her with 3 additional [place] sample pens. I would like to see her back in 3 months. - Continue current regimen.  Onychomycosis Stable: Patient had thrown out previous terbinafine prescription due to concern about the side effects. After further discussion with me about the possible side effects patient felt more comfortable about starting this medication and was asking for another prescription for terbinafine. - Terbinafine prescription placed.  Hyperlipidemia Stable: Patient had been placed on atorvastatin. Unfortunately she states that this medication is too expensive for her and is asking if she has less expensive options. I firmly believe that a patient with her risk factors should be on any statin they're able to tolerate. - Discontinue atorvastatin secondary to cost - New prescription for pravastatin placed.    Orders Placed This Encounter  Procedures  . HgB A1c    Meds ordered this encounter  Medications  . terbinafine (LAMISIL) 250 MG tablet    Sig: Take 1 tablet (250 mg total) by mouth daily.    Dispense:  42 tablet    Refill:  0  . pravastatin (PRAVACHOL) 40 MG  tablet    Sig: Take 1 tablet (40 mg total) by mouth daily.    Dispense:  90 tablet    Refill:  3  . Insulin Degludec (TRESIBA FLEXTOUCH) 100 UNIT/ML SOPN    Sig: Inject 55 Units into the skin daily.    Dispense:  3 pen    Refill:  0    Dispense 90 day supply qs up to 55 units a day.    Order Specific Question:  Lot Number?     Answer:  [id number]    Order Specific Question:  Expiration Date?    Answer:  08/27/2016    Order Specific Question:  Quantity    Answer:  1     Kathee Delton, MD,MS,  PGY2 10/25/2015 6:29 PM   Joined Dr. Wende Mott in visit for follow-up with this recently initiated on insulin therapy case.   Patient reported improved glycemic control with AM fasting CBG checks.   She notes some feelings which could be low readings however she is unwilling to verify low readings with an additional CBG check.   She is in the process of filling out paperwork at [facility] to obtain "TRESIBA" from the manufacturer.   She is much happier with this pen compared to the lantus solostar.    Diabetes - much improved control with use of insulin.  Appears to be successfully administering 55 units daily of Tresiba.   Given symptoms of possible low CBGs and fasting readings reported to be in the low 100s we reduced the [place] to 50 units daily.    3 pens of insulin provided from sample supply.   Tobacco abuse longstanding.   Patient has minimal interest in quitting completely at this time however she was willing to commit to a reduction of use limit.  She will attempt to smoke 12 of fewer cigarettes at least 6 days per week.    Reevaluate success at next Rx Clinic visit.   Pharmacy clinic visit in 3 weeks.

## 2015-10-25 NOTE — Assessment & Plan Note (Signed)
Stable: Patient had thrown out previous terbinafine prescription due to concern about the side effects. After further discussion with me about the possible side effects patient felt more comfortable about starting this medication and was asking for another prescription for terbinafine. - Terbinafine prescription placed.

## 2015-10-26 NOTE — Addendum Note (Signed)
Addended by: Kathrin RuddyKOVAL, PETER G on: 10/26/2015 09:22 AM   Modules accepted: Orders

## 2015-10-26 NOTE — Assessment & Plan Note (Signed)
Tobacco abuse longstanding.   Patient has minimal interest in quitting completely at this time however she was willing to commit to a reduction of use limit.  She will attempt to smoke 12 of fewer cigarettes at least 6 days per week.    Reevaluate success at next Rx Clinic visit.

## 2015-11-08 ENCOUNTER — Telehealth: Payer: Self-pay | Admitting: Pharmacist

## 2015-11-08 DIAGNOSIS — E1165 Type 2 diabetes mellitus with hyperglycemia: Principal | ICD-10-CM

## 2015-11-08 DIAGNOSIS — IMO0001 Reserved for inherently not codable concepts without codable children: Secondary | ICD-10-CM

## 2015-11-08 MED ORDER — INSULIN DEGLUDEC 100 UNIT/ML ~~LOC~~ SOPN: 55.0000 [IU] | PEN_INJECTOR | Freq: Every day | SUBCUTANEOUS | Status: DC

## 2015-11-08 NOTE — Telephone Encounter (Signed)
S:    Phone call by Kristen Garrett, PharmD Resident to patient at request of patient.  Patient reports she is on her last Guinea-Bissauresiba pen.  Patient states she has not been able to complete paperwork at [facility] yet due to problem getting documents together, but plans to go by health department to do paperwork tomorrow.    Patient denies with medications.  Current diabetes medications include:  Kristen Garrett, Invokana Patient reports she ran out of her Invokana 30-day free trial one week ago and has since increased Guinea-Bissauresiba dose to 55 units daily.  Patient reports she has noticed an increase in edema since stopping Invokana.    Patient denies hypoglycemia.    O:  Home fasting CBG: 110 to 178 mg/dL   A/P: Diabetes longstanding currently improved. Patient denies hypoglycemic events and is able to verbalize appropriate hypoglycemia management plan. Patient denies adherence with medication. Control is suboptimal due to medication adherence (ran out of Invokana).  Continued basal insulin Tresiba 55 units daily.  Advised patient to go to [facility] as soon as possible to complete paperwork.  Will plan to reinitiate Invokana once patient is approved for [facility].  Follow up in Pharmacist Clinic Visit 11/15/15.  Will assess edema at that time.  Advised patient to call and schedule same day appointment if edema worsens.    Medication Samples have been provided to the patient.  Drug name: Kristen Garrett Strength: 100 units/mL        Qty: 2 pens   LOT: [id number]  Exp.Date: 12/2016  The patient has been instructed regarding the correct time, dose, and frequency of taking this medication, including desired effects and most common side effects.   Kristen Garrett, Kristen Garrett 12:09 PM 11/08/2015

## 2015-11-13 ENCOUNTER — Telehealth: Payer: Self-pay | Admitting: *Deleted

## 2015-11-13 NOTE — Telephone Encounter (Signed)
Dawn from [facility] called stating patient was very upset that she could not receive her medication Insulin Degludec (TRESIBA FLEXTOUCH) 100 UNIT/ML SOPN.  Per Dawn, [facility] will not be purchasing any [name] due to cost of the medication.  Offered patient other options however, pt refused and stated she was not going to be switching medications.  [name] will not be offered through [facility] or [facility].  Clovis PuMartin, Eldean Klatt L, RN

## 2015-11-15 ENCOUNTER — Encounter: Payer: Self-pay | Admitting: Pharmacist

## 2015-11-15 ENCOUNTER — Ambulatory Visit (INDEPENDENT_AMBULATORY_CARE_PROVIDER_SITE_OTHER): Payer: Self-pay | Admitting: Pharmacist

## 2015-11-15 DIAGNOSIS — IMO0001 Reserved for inherently not codable concepts without codable children: Secondary | ICD-10-CM

## 2015-11-15 DIAGNOSIS — E1165 Type 2 diabetes mellitus with hyperglycemia: Secondary | ICD-10-CM

## 2015-11-15 MED ORDER — INSULIN DETEMIR 100 UNIT/ML FLEXPEN: 30.0000 [IU] | PEN_INJECTOR | Freq: Two times a day (BID) | SUBCUTANEOUS | Status: DC

## 2015-11-15 NOTE — Patient Instructions (Addendum)
Thank you for coming in today!  Continue to take Tresiba 55 units daily until you run out.  Then start taking Levemir 30 units twice a day.   Go to MAP and complete paperwork as soon as possible!  Schedule an appointment with Jay[facility] [place] for financial assessment.    Follow up with Dr. Raymondo BandKoval in 3 weeks.

## 2015-11-15 NOTE — Progress Notes (Signed)
S:    Patient arrives in fair spirits, ambulating without assistance.  Presents for diabetes evaluation, education, and management at the request of Dr. Wende MottMckeag.  Patient was referred on 07/20/15.  Patient was last seen by Primary Care Provider on 10/25/15.   Patient reports Diabetes was diagnosed over 20 years ago.   Patient reports adherence with medications.  Current diabetes medications include: Graylon Gunningresiba, Invokana (does not have right now - waiting on MAP program) Current hypertension medications include: lisinopril   Patient denies hypoglycemic events.  Patient reports complaints of lower extremity edema since stopping Invokana (ran out and is waiting on MAP).  O:  Lab Results  Component Value Date   HGBA1C 11.8 10/25/2015   Filed Vitals:   11/15/15 1604  BP: 124/60  Pulse: 88   Home fasting CBG: 110-150s  10 year ASCVD risk: >30%.   A/P:  Diabetes longstanding currently improved.  Patient denies hypoglycemic events and is able to verbalize appropriate hypoglycemia management plan. Patient reports adherence with Tresiba insulin.  Patient is currently nonadherent with Invokana because she ran out of medication and is waiting to complete paperwork from MAP program.  Have already exhausted free 30 day coupon card for patient.  Continued basal insulin Tresiba 55 units daily until she finishes sample.  Received notification from MAP that they are unable to receive Tresiba through MAP program.  Will change patient to Levemir 30 units BID.  Provided patient with 3 sample pens of Levemir to start when she runs out of Guinea-Bissauresiba.  Sent Levemir prescription to MAP and advised patient to go to MAP ASAP to complete MAP paperwork for Levemir.  Will plan to restart Invokana as soon as patient receives from MAP.  Next A1C anticipated in 2 months.    ASCVD risk greater than 7.5%. Continued Aspirin 81 mg.  Patient is prescribed pravastatin 40 mg but has not picked it up from the pharmacy yet.  Advised  patient to pick up pravastatin from pharmacy and start taking.   Hypertension currently controlled.  Patient reports adherence with medication.  Patient requested initiation of HCTZ.  Discussed possibility of worsened glycemic control with thiazides.  Advised patient to elevate legs to help improve edema.    Written patient instructions provided.  Total time in face to face counseling 30 minutes.  Follow up in Pharmacist Clinic Visit in 3 weeks.   Patient seen with Genia Hotterachel Kim, PharmD Candidate and [name], PharmD Resident.

## 2015-11-15 NOTE — Assessment & Plan Note (Signed)
Diabetes longstanding currently improved.  Patient denies hypoglycemic events and is able to verbalize appropriate hypoglycemia management plan. Patient reports adherence with Tresiba insulin.  Patient is currently nonadherent with Invokana because she ran out of medication and is waiting to complete paperwork from MAP program.  Have already exhausted free 30 day coupon card for patient.  Continued basal insulin Tresiba 55 units daily until she finishes sample.  Received notification from MAP that they are unable to receive Tresiba through MAP program.  Will change patient to Levemir 30 units BID.  Provided patient with 3 sample pens of Levemir to start when she runs out of Guinea-Bissauresiba.  Sent Levemir prescription to MAP and advised patient to go to MAP ASAP to complete MAP paperwork for Levemir.  Will plan to restart Invokana as soon as patient receives from MAP.  Next A1C anticipated in 2 months.

## 2015-11-16 NOTE — Telephone Encounter (Signed)
Patient seen by Dr. Raymondo BandKoval yesterday and addressed this issue. I will f/u with her at next visit.

## 2015-11-16 NOTE — Progress Notes (Signed)
Patient ID: Kristen Garrett, female   DOB: 02/27/1961, 55 y.o.   MRN: [id number] Reviewed: Agree with Dr. Koval's documentation and management.   

## 2015-12-06 ENCOUNTER — Ambulatory Visit: Payer: Self-pay | Admitting: Pharmacist

## 2015-12-21 ENCOUNTER — Ambulatory Visit (INDEPENDENT_AMBULATORY_CARE_PROVIDER_SITE_OTHER): Payer: Self-pay | Admitting: Pharmacist

## 2015-12-21 DIAGNOSIS — E1165 Type 2 diabetes mellitus with hyperglycemia: Secondary | ICD-10-CM

## 2015-12-21 NOTE — Progress Notes (Deleted)
Plan: - Specificity *** - Lethality *** - Access *** - Proximity of social support ***  Barriers to self-harm:  ***  Substance use / abuse:  ***  History of SI / Attempts: ***  Family history of SI:  Duration and Intensity of SI:  ***  Hospitalizations:   Chart review for additional risk factors:  Chronic pain, insomnia, panic attacks, age, gender   Coping mechanisms:

## 2015-12-24 NOTE — Progress Notes (Signed)
Chart openned inappropriately.

## 2015-12-24 NOTE — Assessment & Plan Note (Signed)
Chart openned inadvertently.   Patient did NOT arrive.

## 2015-12-28 ENCOUNTER — Telehealth: Payer: Self-pay | Admitting: Family Medicine

## 2015-12-28 NOTE — Telephone Encounter (Signed)
Pt is calling because she is using the MAP program for her medications. She would like the doctor to change her Levemir  To the Lancets. She would like to go there today and pick theses up. Myriam Jacobsonjw

## 2015-12-31 ENCOUNTER — Encounter: Payer: Self-pay | Admitting: Pharmacist

## 2015-12-31 ENCOUNTER — Ambulatory Visit (INDEPENDENT_AMBULATORY_CARE_PROVIDER_SITE_OTHER): Payer: Self-pay | Admitting: Pharmacist

## 2015-12-31 VITALS — BP 130/80 | HR 82 | Wt 200.2 lb

## 2015-12-31 DIAGNOSIS — E1165 Type 2 diabetes mellitus with hyperglycemia: Secondary | ICD-10-CM

## 2015-12-31 DIAGNOSIS — F17200 Nicotine dependence, unspecified, uncomplicated: Secondary | ICD-10-CM

## 2015-12-31 DIAGNOSIS — Z794 Long term (current) use of insulin: Secondary | ICD-10-CM

## 2015-12-31 DIAGNOSIS — E1142 Type 2 diabetes mellitus with diabetic polyneuropathy: Secondary | ICD-10-CM

## 2015-12-31 MED ORDER — INSULIN GLARGINE 100 UNIT/ML SOLOSTAR PEN: 60.0000 [IU] | PEN_INJECTOR | Freq: Every day | SUBCUTANEOUS | Status: DC

## 2015-12-31 MED ORDER — INSULIN DEGLUDEC 100 UNIT/ML ~~LOC~~ SOPN: 50.0000 [IU] | PEN_INJECTOR | Freq: Every day | SUBCUTANEOUS | Status: DC

## 2015-12-31 NOTE — Telephone Encounter (Signed)
Patient has appointment this AM with Dr. Raymondo BandKoval to discuss.

## 2015-12-31 NOTE — Progress Notes (Signed)
    S:    Patient arrives in fair spirits, stating she is "not good".   Presents for diabetes evaluation, education, and management follow up.   Patient was last seen by Primary Care Provider on 10/25/2015.  Patient reports adherence with medications however she did STOP her Levemir and returned to Lantus with a supply she had in her refrigerator.   She notes she has been taking less than her previous maximum dose of long-acting insulin (60 units) and is now taking 48 units of Lantus.    She was delighted to here that samples of [name] could be used today.  She is NOT able to get Guinea-Bissauresiba through MAP and she is trying to get her own insurance so that she can obtain the insulin that she in most interested in trying.    Patient denies hypoglycemic events. Patient reported exercise habits: increased since she has started care of her 53 year-old grandson who has come to live with her from [place].  She states she will raise him through high school.  She states that her grandson keeps her busier but also knows he is a source of stress at times.    O:  Lab Results  Component Value Date   HGBA1C 11.8 10/25/2015   Filed Vitals:   12/31/15 1009  BP: 130/80  Pulse: 82    Home fasting CBG: > 300 at times but better than in the past.   A/P: Diabetes longstanding currently with poor control due to medication supply instability/inconsistency of adherence AND life stress.     Patient denies hypoglycemic events and is able to verbalize appropriate hypoglycemia management plan. Patient reports adherence with medication however it was not what we had prescribed (she has missed her last two appointments in pharmacy clinic).  Restarted Tresiba (insulin deglutec) at a dose of 50 units once daily.  Asked patient to increase dose by 1 unit every three days if blood sugar remained higher than 200 for 3 days.  She was provided with written patient instructions and verbalized treatment plan.  She was made aware of my  upcoming period of time out of office.   Three Tresiba sample pens were provided.  I anticipate Lantus at 60 units daily as her dose - provided new prescription to MAP.   Total time in face to face counseling 35 minutes.  Follow up in Pharmacist Clinic Visit end of July if needed. Left message for new Rx at MAP on phone line.

## 2015-12-31 NOTE — Patient Instructions (Signed)
Please Stop the Lantus  Start Tresiba Samples at 50 units daily.  Increase your dose by 1 unit every three days if you blood sugar is above 200 on all three days.   Follow up with pharmacy clinic during the last week of July.

## 2015-12-31 NOTE — Assessment & Plan Note (Signed)
Diabetes longstanding currently with poor control due to medication supply instability/inconsistency of adherence AND life stress.     Patient denies hypoglycemic events and is able to verbalize appropriate hypoglycemia management plan. Patient reports adherence with medication however it was not what we had prescribed (she has missed her last two appointments in pharmacy clinic).  Restarted Tresiba (insulin deglutec) at a dose of 50 units once daily.  Asked patient to increase dose by 1 unit every three days if blood sugar remained higher than 200 for 3 days.  She was provided with written patient instructions and verbalized treatment plan.  She was made aware of my upcoming period of time out of office.   Three Tresiba sample pens were provided.  I anticipate Lantus at 60 units daily as her dose - provided new prescription to MAP.   Total time in face to face counseling 35 minutes.  Follow up in Pharmacist Clinic Visit end of July if needed.

## 2015-12-31 NOTE — Assessment & Plan Note (Signed)
Remains contemplative about quitting at this time.   Encouraged abstinence or cutting down.   Reassess at next visit.

## 2016-01-02 ENCOUNTER — Telehealth: Payer: Self-pay | Admitting: *Deleted

## 2016-01-02 DIAGNOSIS — IMO0001 Reserved for inherently not codable concepts without codable children: Secondary | ICD-10-CM

## 2016-01-02 DIAGNOSIS — E1165 Type 2 diabetes mellitus with hyperglycemia: Principal | ICD-10-CM

## 2016-01-02 MED ORDER — CANAGLIFLOZIN 100 MG PO TABS: 100.0000 mg | ORAL_TABLET | Freq: Every day | ORAL | Status: DC

## 2016-01-02 MED ORDER — INSULIN DEGLUDEC 100 UNIT/ML ~~LOC~~ SOPN: 60.0000 [IU] | PEN_INJECTOR | Freq: Every day | SUBCUTANEOUS | Status: DC

## 2016-01-02 NOTE — Telephone Encounter (Signed)
Dawn from [facility] called stating they could get Insulin Degludec (TRESIBA FLEXTOUCH) 100 UNIT/ML SOPN for free and need a refill on canagliflozin (INVOKANA) 100 MG TABS tablet.  Ok per Dr. Raymondo BandKoval to fax over [name] 60 Units once daily and refill Invokana.  Clovis PuMartin, Tamika L, RN

## 2016-01-07 NOTE — Progress Notes (Signed)
Patient ID: Kristen Garrett, female   DOB: 07/08/1960, 55 y.o.   MRN: [id number] Reviewed: Agree with Dr. Koval's documentation and management.   

## 2016-01-18 ENCOUNTER — Telehealth: Payer: Self-pay | Admitting: *Deleted

## 2016-01-18 DIAGNOSIS — E1142 Type 2 diabetes mellitus with diabetic polyneuropathy: Secondary | ICD-10-CM

## 2016-01-18 MED ORDER — INSULIN DEGLUDEC 100 UNIT/ML ~~LOC~~ SOPN: 50.0000 [IU] | PEN_INJECTOR | Freq: Every day | SUBCUTANEOUS | Status: DC

## 2016-01-18 NOTE — Telephone Encounter (Signed)
Patient walked into clinic stating she is out of Guinea-Bissauresiba Insulin.  She went to MAP to check if they had her medication ready, however it was not available at this time.  Two Tresiba Pen samples given.  Patient has an upcoming appointment with Dr. Raymondo BandKoval next Friday 01/25/16.  Clovis PuMartin, Cache Decoursey L, RN

## 2016-01-25 ENCOUNTER — Ambulatory Visit: Payer: Self-pay | Admitting: Pharmacist

## 2016-01-29 ENCOUNTER — Other Ambulatory Visit: Payer: Self-pay | Admitting: Family Medicine

## 2016-01-31 ENCOUNTER — Encounter: Payer: Self-pay | Admitting: Pharmacist

## 2016-01-31 ENCOUNTER — Ambulatory Visit (INDEPENDENT_AMBULATORY_CARE_PROVIDER_SITE_OTHER): Payer: Self-pay | Admitting: Pharmacist

## 2016-01-31 DIAGNOSIS — Z794 Long term (current) use of insulin: Secondary | ICD-10-CM

## 2016-01-31 DIAGNOSIS — IMO0001 Reserved for inherently not codable concepts without codable children: Secondary | ICD-10-CM

## 2016-01-31 DIAGNOSIS — E1165 Type 2 diabetes mellitus with hyperglycemia: Secondary | ICD-10-CM

## 2016-01-31 DIAGNOSIS — F17200 Nicotine dependence, unspecified, uncomplicated: Secondary | ICD-10-CM

## 2016-01-31 MED ORDER — INSULIN DEGLUDEC 100 UNIT/ML ~~LOC~~ SOPN: 45.0000 [IU] | PEN_INJECTOR | Freq: Every day | SUBCUTANEOUS | 0 refills | Status: DC

## 2016-01-31 NOTE — Assessment & Plan Note (Signed)
Continues to work on tobacco reduction.  No longer wearing nicotine patches.  Decrease tobacco use to <8 cigarettes/day

## 2016-01-31 NOTE — Assessment & Plan Note (Signed)
Diabetes longstanding diagnosed currently uncontrolled as evidenced by A1c of 11.8 on 10/25/2015, however her home readings are much improved and appear to be near goal.   She has been using less insulin than in the past which is likely related to increased activity.  Patient denies hypoglycemic events and is able to verbalize appropriate hypoglycemia management plan. Patient reports adherence with medication. Control is suboptimal due to stressors, diet.  Continued basal insulin Tresiba (insulin degludec). Patient will continue to titrate 1 unit/day if fasting CBGs > 100mg /dl until fasting CBGs reach goal or next visit.  2 sample pens and pen needles given (~2 week supply).  Continue to pursue getting approval for MAP in order to obtain Invokana (canagliflozin).

## 2016-01-31 NOTE — Patient Instructions (Signed)
Do your best to smoke less than 8 cigarettes per day.   Same dose of Tresiba 45 units once daily.   Keep up the great job walking and working hard on your diet.   See you at the next pharmacy visit in 2 weeks.

## 2016-01-31 NOTE — Progress Notes (Signed)
    S:    Patient arrives in good spirits without assistance.  Presents for diabetes evaluation, education, and management. Patient was last seen by Primary Care Provider on 10/25/15.   Patient reports adherence with medications.  Current diabetes medications include:Tresiba 45 units daily, invokana not obtained yet d/t issues with Medicaid vs. MAP eligibility.  Current hypertension medications include: lisinopril 40 mg daily  Patient denies hypoglycemic events.  Patient reported exercise habits: "working hard" Patient reported diet habits: "eating better"  Pt reports she has but back from 15 cigarettes/day to 8/day.   .[email or web address]   O:  Lab Results  Component Value Date   HGBA1C 11.8 10/25/2015   There were no vitals filed for this visit.  Home fasting CBG: 110-145 with occasional excursions to the 200s last month   10 year ASCVD risk: 42% .  A/P: Diabetes longstanding diagnosed currently uncontrolled as evidenced by A1c of 11.8 on 10/25/2015, however her home readings are much improved and appear to be near goal.   She has been using less insulin than in the past which is likely related to increased activity.  Patient denies hypoglycemic events and is able to verbalize appropriate hypoglycemia management plan. Patient reports adherence with medication. Control is suboptimal due to stressors, diet.  Continued basal insulin Tresiba (insulin degludec). Patient will continue to titrate 1 unit/day if fasting CBGs > 100mg /dl until fasting CBGs reach goal or next visit.  2 sample pens and pen needles given (~2 week supply).  Continue to pursue getting approval for MAP in order to obtain Invokana (canagliflozin).  Next A1C anticipated next visit    ASCVD risk greater than 7.5%. Continued Aspirin 81 mg and Continued pravastatin 40 mg.   Decrease tobacco use to <8 cigarettes/day  Written patient instructions provided.  Total time in face to face counseling 30 minutes.   Follow up in  Pharmacist Clinic Visit in 2 weeks.   Patient seen with Carole Binning, PharmD Candidate and Devota Pace, PharmD Resident and Hazle Nordmann, PharmD Resident.

## 2016-02-01 NOTE — Progress Notes (Signed)
Patient ID: Kristen Garrett, female   DOB: 11/30/1960, 55 y.o.   MRN: [id number] Reviewed: Agree with Dr. Koval's documentation and management.   

## 2016-02-13 ENCOUNTER — Other Ambulatory Visit: Payer: Self-pay | Admitting: Family Medicine

## 2016-02-14 ENCOUNTER — Ambulatory Visit: Payer: Self-pay | Admitting: Pharmacist

## 2016-02-14 ENCOUNTER — Ambulatory Visit (INDEPENDENT_AMBULATORY_CARE_PROVIDER_SITE_OTHER): Payer: Self-pay | Admitting: Pharmacist

## 2016-02-14 ENCOUNTER — Encounter: Payer: Self-pay | Admitting: Pharmacist

## 2016-02-14 DIAGNOSIS — Z794 Long term (current) use of insulin: Secondary | ICD-10-CM

## 2016-02-14 DIAGNOSIS — IMO0001 Reserved for inherently not codable concepts without codable children: Secondary | ICD-10-CM

## 2016-02-14 DIAGNOSIS — IMO0002 Reserved for concepts with insufficient information to code with codable children: Secondary | ICD-10-CM

## 2016-02-14 DIAGNOSIS — I10 Essential (primary) hypertension: Secondary | ICD-10-CM

## 2016-02-14 DIAGNOSIS — E785 Hyperlipidemia, unspecified: Secondary | ICD-10-CM

## 2016-02-14 DIAGNOSIS — F17200 Nicotine dependence, unspecified, uncomplicated: Secondary | ICD-10-CM

## 2016-02-14 DIAGNOSIS — E1142 Type 2 diabetes mellitus with diabetic polyneuropathy: Secondary | ICD-10-CM

## 2016-02-14 DIAGNOSIS — E1165 Type 2 diabetes mellitus with hyperglycemia: Secondary | ICD-10-CM

## 2016-02-14 LAB — POCT GLYCOSYLATED HEMOGLOBIN (HGB A1C): Hemoglobin A1C: 11.9

## 2016-02-14 MED ORDER — INSULIN DEGLUDEC 100 UNIT/ML ~~LOC~~ SOPN: 45.0000 [IU] | PEN_INJECTOR | Freq: Every day | SUBCUTANEOUS | 0 refills | Status: DC

## 2016-02-14 NOTE — Assessment & Plan Note (Signed)
Longstanding nicotine abuse and chronic tobacco use.  Decreased smoking to 10 cigarettes per day recently without the use of any nicotine replacement.  Currently NOT taking any NRT.  Encouraged to continue avoiding mid-day smoking over the next few weeks.  Anticipate readdressing at the next visits and target quitting tobacco by the end of the year.

## 2016-02-14 NOTE — Progress Notes (Addendum)
    S:    Patient arrives in good spirits.  Presents for diabetes evaluation, education, and management at the request of Dr. Wende MottMcKeag. Patient was last seen by Primary Care Provider on 12/28/2015.   Patient reports adherence with medications.   Current diabetes medications include: Tresiba 45-47 units daily.  Current hypertension medications include: Lisinopril.   Patient denies hypoglycemic events.  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   Patient reported dietary habits: Eats 3 meals/day Breakfast: Yogurt, banana, grits, eggs, sausage. Snacks: Admits to dietary indiscretion of eating marchmallows, candies which she noted raised her blood sugar.    Patient denies nocturia. Reports 0 or 1 time per nigh awakening for urination.  Patient reports neuropathy which is controlled with use of gabapentin.  Patient denies visual changes.  O:  Lab Results  Component Value Date   HGBA1C 11.8 10/25/2015    Home fasting CBG: 110-190 Does NOT measure post-prandial CBGs   10 year ASCVD risk:  7.5%  A/P: Diabetes longstanding currently at 11.9 without improvement since last A1c in April.   She likely has high post-prandial.  Reports taking [name] and states fasting CBGs are at goal.  Patient denies hypoglycemic events and is able to verbalize appropriate hypoglycemia management plan. Control is suboptimal due to dietary indiscretion, sedentary lifestyle.   Continued Tresiba at same dose.  No change today as she is negative for nocturia, and has fasting readings near goal.   She likely needs prandial insulin but is unlikely to be willing to try this as it would require more injections per day.   Awaiting Medicaid denial prior to processing paperwork through MAP for Invokana (canagliflozin).  Next A1C anticipated in 3 months after starting canagliflozin.   ASCVD risk greater than 7.5%. Continued Aspirin 81 mg and Continued  pravastatin 40mg .  Lipid panel obtained today.  Denies any myalgias, report adherence with medication.   Hypertension longstanding currently at goal.  Patient reports adherence with medication.   Longstanding nicotine abuse and chronic tobacco use.  Decreased smoking to 10 cigarettes per day recently without the use of any nicotine replacement.  Currently NOT taking any NRT.  Encouraged to continue avoiding mid-day smoking over the next few weeks.  Anticipate readdressing at the next visits and target quitting tobacco by the end of the year.   Written patient instructions provided.  Total time in face to face counseling 30 minutes.   Follow up in Pharmacist Clinic Visit 2-3 weeks after next with PCP - Dr. Wende MottMcKeag.    Patient seen with Carole Binninghristine Chong, PharmD Candidate and Devota Pacearoline Welles, PharmD Resident.   Blood sugar - 400 measured with BMET yesterday.   Similar to A1C value of 11.9  However given home fasting readings and lack of nocturia these elevations are likely high post-prandial readings.  NOTE:  this patient is highly needle phobic and not interested in additional shots.   Reevalutate control post initiation of SGLT2 therapy once received via MAP.

## 2016-02-14 NOTE — Assessment & Plan Note (Signed)
Diabetes longstanding currently at 11.9 without improvement since last A1c in April.   She likely has high post-prandial.  Reports taking [name] and states fasting CBGs are at goal.  Patient denies hypoglycemic events and is able to verbalize appropriate hypoglycemia management plan. Control is suboptimal due to dietary indiscretion, sedentary lifestyle.   Continued Tresiba at same dose.  No change today as she is negative for nocturia, and has fasting readings near goal.   She likely needs prandial insulin but is unlikely to be willing to try this as it would require more injections per day.   Awaiting Medicaid denial prior to processing paperwork through MAP for Invokana (canagliflozin).  Next A1C anticipated in 3 months after starting canagliflozin.

## 2016-02-14 NOTE — Patient Instructions (Addendum)
Great to see you today.    Please continue all medications as previous.  No change today.   Please keep up the great work avoiding mid-day cigarettes during your time away from town.    Keep your visit with Dr. Wende MottMcKeag on the 6th.   Next visit with Rx Clinic in mid-late September.

## 2016-02-14 NOTE — Assessment & Plan Note (Signed)
ASCVD risk greater than 7.5%. Continued Aspirin 81 mg and Continued pravastatin 40mg .  Lipid panel obtained today.  Denies any myalgias, report adherence with medication.

## 2016-02-15 LAB — LIPID PANEL
CHOL/HDL RATIO: 6.7 ratio — AB (ref <=5.0)
CHOLESTEROL: 175 mg/dL (ref 125–200)
HDL: 26 mg/dL — ABNORMAL LOW (ref >=46)
LDL Cholesterol: 91 mg/dL (ref <130)
Triglycerides: 291 mg/dL — ABNORMAL HIGH (ref <150)
VLDL: 58 mg/dL — AB (ref <30)

## 2016-02-15 LAB — BASIC METABOLIC PANEL
BUN: 14 mg/dL (ref 7–25)
CO2: 25 mmol/L (ref 20–31)
Calcium: 8.9 mg/dL (ref 8.6–10.4)
Chloride: 102 mmol/L (ref 98–110)
Creat: 0.81 mg/dL (ref 0.50–1.05)
GLUCOSE: 400 mg/dL — AB (ref 65–99)
POTASSIUM: 4.4 mmol/L (ref 3.5–5.3)
Sodium: 137 mmol/L (ref 135–146)

## 2016-02-15 NOTE — Progress Notes (Signed)
Patient ID: Kristen Garrett, female   DOB: 1961-06-22, 55 y.o.   MRN: [id number] Reviewed: Agree with Dr. Macky LowerKoval's documentation and management.

## 2016-03-05 ENCOUNTER — Ambulatory Visit: Payer: Self-pay | Admitting: Family Medicine

## 2016-03-06 ENCOUNTER — Ambulatory Visit (INDEPENDENT_AMBULATORY_CARE_PROVIDER_SITE_OTHER): Payer: Self-pay | Admitting: Pharmacist

## 2016-03-06 ENCOUNTER — Encounter: Payer: Self-pay | Admitting: Pharmacist

## 2016-03-06 DIAGNOSIS — E1142 Type 2 diabetes mellitus with diabetic polyneuropathy: Secondary | ICD-10-CM

## 2016-03-06 DIAGNOSIS — E1165 Type 2 diabetes mellitus with hyperglycemia: Secondary | ICD-10-CM

## 2016-03-06 DIAGNOSIS — IMO0002 Reserved for concepts with insufficient information to code with codable children: Secondary | ICD-10-CM

## 2016-03-06 DIAGNOSIS — Z794 Long term (current) use of insulin: Secondary | ICD-10-CM

## 2016-03-06 DIAGNOSIS — IMO0001 Reserved for inherently not codable concepts without codable children: Secondary | ICD-10-CM

## 2016-03-06 MED ORDER — INSULIN DEGLUDEC 100 UNIT/ML ~~LOC~~ SOPN: 50.0000 [IU] | PEN_INJECTOR | Freq: Every day | SUBCUTANEOUS | 0 refills | Status: DC

## 2016-03-06 NOTE — Assessment & Plan Note (Signed)
Diabetes longstanding currently doing fair with once daily Tresiba at a dose of 45 units. Patient denies hypoglycemic events and is able to verbalize appropriate hypoglycemia management plan. Patient reports adherence with medication. Control is suboptimal due to insulin resistance and likely need for meal time insulin.  However patient is highly resistant to taking additional shots of medication.   Discussed option of using Insulin Deglutec combination with LIraglutide Evaristo Bury(Tresiba plus Victoza) in one shot once daily.  Inquired with MAP to evaluate if this therapy can be ordered through the MAP program (awaiting call back)  We will determine this options availability prior to making other changes. Today we increased the dose of Tresiba from 45  to 50 units once daily and provided one sample pen.   Patients was also asked to check blood sugars 2 hours after meals to determine post meal blood sugars.   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  She might be willing to consider use of another SGLT2 - Jardiance for example.   Mealtime insulin dosing was mentioned - patient was resistant to taking more shots per day.   She left visit unhappy/frustrated with her A1C and lack of improvement in control. Her frustration with weight gain while on insulin is also concerning to her.   I asked her to follow up next with her PCP and then we would follow up based on results of MAP request.

## 2016-03-06 NOTE — Patient Instructions (Signed)
Please increase your [name] to 50 units once daily.   Follow up with Dr. Wende MottMcKeag in 2-3 weeks.

## 2016-03-06 NOTE — Progress Notes (Signed)
    S:    Patient arrives in poor spirits accompanied by her daughter.  She states she is in a bad mood today.  She was unwilling to have any students in the room today.  She was tearful during office visits after we discussed results of her A1C - which was 11.9 at most recent check.  Presents for diabetes evaluation, education, and management at the request of Dr. Wende MottMcKeag. Patient missed appointment with his PCP yesterday.   Patient reports adherence with medications.  Current diabetes medications include: Tresiba 45 units once daily with shot administration happening much faster than in the past.      Patient denies hypoglycemic events.  Patient reported exercise habits:    Patient denies nocturia.  Patient reports neuropathy - when she eats something sweet (cheats on her diet).    O:  Lab Results  Component Value Date   HGBA1C 11.9 02/14/2016   There were no vitals filed for this visit.  Home fasting CBG:  2 hour post-prandials NOT currently testing however her intermittent checks reveal readings > 300.    A/P: Diabetes longstanding currently doing fair with once daily Tresiba at a dose of 45 units. Patient denies hypoglycemic events and is able to verbalize appropriate hypoglycemia management plan. Patient reports adherence with medication. Control is suboptimal due to insulin resistance and likely need for meal time insulin.  However patient is highly resistant to taking additional shots of medication.   Discussed option of using Insulin Deglutec combination with LIraglutide Evaristo Bury(Tresiba plus Victoza) in one shot once daily.  Inquired with MAP to evaluate if this therapy can be ordered through the MAP program (awaiting call back)  We will determine this options availability prior to making other changes. Today we increased the dose of Tresiba from 45  to 50 units once daily and provided one sample pen.   Patients was also asked to check blood sugars 2 hours after meals to determine post  meal blood sugars.   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  She might be willing to consider use of another SGLT2 - Jardiance for example.   Mealtime insulin dosing was mentioned - patient was resistant to taking more shots per day.   She left visit unhappy/frustrated with her A1C and lack of improvement in control. Her frustration with weight gain while on insulin is also concerning to her.   I asked her to follow up next with her PCP and then we would follow up based on results of MAP request.   Written patient instructions provided.  Total time in face to face counseling 40 minutes.   Follow up in Pharmacist Clinic Visit PRN.

## 2016-03-07 ENCOUNTER — Other Ambulatory Visit: Payer: Self-pay | Admitting: Family Medicine

## 2016-03-07 NOTE — Telephone Encounter (Signed)
Pt called again about her refill on gabpentin. She says it is urgernt that this gets refilled today.

## 2016-03-07 NOTE — Telephone Encounter (Signed)
Needs refill on gabapentin.  Only has one dose left. [facility] on [address]

## 2016-03-07 NOTE — Progress Notes (Signed)
Patient ID: Kristen Garrett, female   DOB: 10/05/1960, 55 y.o.   MRN: [id number] Reviewed: Agree with Dr. Koval's documentation and management.   

## 2016-03-08 ENCOUNTER — Other Ambulatory Visit: Payer: Self-pay | Admitting: Obstetrics and Gynecology

## 2016-03-08 MED ORDER — GABAPENTIN 300 MG PO CAPS: 600.0000 mg | ORAL_CAPSULE | Freq: Three times a day (TID) | ORAL | 0 refills | Status: DC

## 2016-03-13 ENCOUNTER — Encounter: Payer: Self-pay | Admitting: Family Medicine

## 2016-03-13 ENCOUNTER — Ambulatory Visit (INDEPENDENT_AMBULATORY_CARE_PROVIDER_SITE_OTHER): Payer: Self-pay | Admitting: Family Medicine

## 2016-03-13 VITALS — BP 120/80 | HR 104 | Temp 98.3°F | Ht 66.5 in | Wt 202.0 lb

## 2016-03-13 DIAGNOSIS — B351 Tinea unguium: Secondary | ICD-10-CM

## 2016-03-13 DIAGNOSIS — IMO0002 Reserved for concepts with insufficient information to code with codable children: Secondary | ICD-10-CM

## 2016-03-13 DIAGNOSIS — IMO0001 Reserved for inherently not codable concepts without codable children: Secondary | ICD-10-CM

## 2016-03-13 DIAGNOSIS — M7742 Metatarsalgia, left foot: Secondary | ICD-10-CM | POA: Insufficient documentation

## 2016-03-13 DIAGNOSIS — E1165 Type 2 diabetes mellitus with hyperglycemia: Secondary | ICD-10-CM

## 2016-03-13 DIAGNOSIS — E1142 Type 2 diabetes mellitus with diabetic polyneuropathy: Secondary | ICD-10-CM

## 2016-03-13 DIAGNOSIS — Z794 Long term (current) use of insulin: Secondary | ICD-10-CM

## 2016-03-13 MED ORDER — INSULIN DEGLUDEC 100 UNIT/ML ~~LOC~~ SOPN
50.0000 [IU] | PEN_INJECTOR | Freq: Every day | SUBCUTANEOUS | 0 refills | Status: DC
Start: 2016-03-13 — End: 2016-04-07

## 2016-03-13 NOTE — Assessment & Plan Note (Signed)
Patient has a history of onychomycoses. She has been treated adequately in the past but still complains of some discomfort and changes to the color of her toenails. - Referral to podiatry

## 2016-03-13 NOTE — Assessment & Plan Note (Signed)
Patient is here with complaints of metatarsalgia along the dorsal aspect of the distal first metatarsal and first phalanx. Etiology unknown at this time however most likely diagnosis is irritation secondary to osteoarthritic changes. No evidence of tendinitis as patient had no pain with motions against resistance. There is evidence of onychomycoses on exam as well. - Rest, ice, and elevation. - Sturdy footwear with adequate support

## 2016-03-13 NOTE — Progress Notes (Signed)
   HPI  CC: Diabetes check. Patient is here for diabetes check. She has been recently seen Dr. Raymondo BandKoval for much of her medical management. Today she states that she has been doing well with her medication regimen. She endorses good compliance. Occasionally forgets to take a.m. CBGs. Denies any symptoms of hypoglycemia, no headache, blurred vision, dizziness, lightheadedness, diaphoresis, chest pain, shortness of breath, nausea, vomiting, or diarrhea.  Foot pain: Patient is also complaining of some foot pain. She states this is been going on for approximately 4 months. Pain is located along the distal aspect of the left foot. Pain begins along the DIP of the first digit and extends towards the MTP of the same digit. Nothing seems to make this better or worse. She has not taken anything for this. Denies any trauma or injury. No recent swelling, erythema, weakness, numbness, or peers seizures.   Review of Systems   See HPI for ROS. All other systems reviewed and are negative.  CC, SH/smoking status, and VS noted  Objective: BP 120/80   Pulse (!) 104   Temp 98.3 F (36.8 C) (Oral)   Ht 5' 6.5" (1.689 m)   Wt 202 lb (91.6 kg)   LMP 02/17/2015   SpO2 98%   BMI 32.12 kg/m  Gen: NAD, alert, cooperative CV: RRR, no murmur Resp: CTAB, no wheezes, non-labored Neuro: Alert and oriented, Speech clear, No gross deficits Foot, left: No obvious bony deformity or asymmetry. Tenderness to palpation along the dorsal aspect of the distal first metatarsal and proximal phalanx. No erythema, swelling, or effusion. ROM intact throughout. Strength intact throughout without pain against resistance. No radiation of pain. Some improved onychomycoses noted from previous exam.  Assessment and plan:  Metatarsalgia of left foot Patient is here with complaints of metatarsalgia along the dorsal aspect of the distal first metatarsal and first phalanx. Etiology unknown at this time however most likely diagnosis is  irritation secondary to osteoarthritic changes. No evidence of tendinitis as patient had no pain with motions against resistance. There is evidence of onychomycoses on exam as well. - Rest, ice, and elevation. - Sturdy footwear with adequate support  Onychomycosis Patient has a history of onychomycoses. She has been treated adequately in the past but still complains of some discomfort and changes to the color of her toenails. - Referral to podiatry   Orders Placed This Encounter  Procedures  . Ambulatory referral to Podiatry    Referral Priority:   Routine    Referral Type:   Consultation    Referral Reason:   Specialty Services Required    Requested Specialty:   Podiatry    Number of Visits Requested:   1    Meds ordered this encounter  Medications  . insulin degludec (TRESIBA FLEXTOUCH) 100 UNIT/ML SOPN FlexTouch Pen    Sig: Inject 0.5 mLs (50 Units total) into the skin daily.    Dispense:  3 pen    Refill:  0    Dispense 90 day supply qs up to 55 units a day.    Order Specific Question:   Lot Number?    Answer:   [id number]    Order Specific Question:   Expiration Date?    Answer:   09/16/2015    Order Specific Question:   Quantity    Answer:   3     Kathee DeltonIan D Katura Eatherly, MD,MS,  PGY3 03/13/2016 5:59 PM

## 2016-03-13 NOTE — Patient Instructions (Signed)
It was a pleasure seeing you today in our clinic. Today we discussed your diabetes and foot pain. Here is the treatment plan we have discussed and agreed upon together:   - Continue taking your diabetic medications as prescribed. Make sure to measure your blood sugars daily. - Regarding your foot pain think it would be wise to ice this region every night for at least 30 minutes. - I have placed a referral to podiatry. - Follow-up with me or Dr. Raymondo BandKoval in 3 months.

## 2016-03-31 ENCOUNTER — Ambulatory Visit: Payer: Self-pay | Admitting: Podiatry

## 2016-04-03 ENCOUNTER — Ambulatory Visit (INDEPENDENT_AMBULATORY_CARE_PROVIDER_SITE_OTHER): Payer: Self-pay | Admitting: Pharmacist

## 2016-04-03 ENCOUNTER — Encounter: Payer: Self-pay | Admitting: Pharmacist

## 2016-04-03 DIAGNOSIS — IMO0001 Reserved for inherently not codable concepts without codable children: Secondary | ICD-10-CM

## 2016-04-03 DIAGNOSIS — E1142 Type 2 diabetes mellitus with diabetic polyneuropathy: Secondary | ICD-10-CM

## 2016-04-03 DIAGNOSIS — Z794 Long term (current) use of insulin: Secondary | ICD-10-CM

## 2016-04-03 DIAGNOSIS — IMO0002 Reserved for concepts with insufficient information to code with codable children: Secondary | ICD-10-CM

## 2016-04-03 DIAGNOSIS — E1165 Type 2 diabetes mellitus with hyperglycemia: Secondary | ICD-10-CM

## 2016-04-03 DIAGNOSIS — F17200 Nicotine dependence, unspecified, uncomplicated: Secondary | ICD-10-CM

## 2016-04-03 NOTE — Progress Notes (Addendum)
    S:    Patient arrives in fair [place].  Presents for diabetes evaluation, education, and management follow-up.  Patient states she is NOT working at this time.  She has been keeping herself busy with doing tasks around the house.  She realized that she needs a long-term medication supply solution following a conversation with the [facility] coordinator [facility]. [facility] or lack of Medicaid approval.   Patient reports adherence with medications.  Current diabetes medications include: Tresiba 50 units daily.  Current hypertension medications include: Lisinopril 40mg  daily.   Patient denies hypoglycemic events.  Patient reported exercise habits: include housework primarily.    Patient denies nocturia.  Patient denies neuropathy. Patient denies visual changes.  O:  Lab Results  Component Value Date   HGBA1C 11.9 02/14/2016   Vitals:   04/03/16 1114  BP: 133/75  Pulse: 80    Home fasting CBG: 100-200 and occasional blood sugar checks in the evening have also been controlled per patient report.    A/P: Diabetes longstanding currently at similar level in the past based on home reported readings.   Patient denies hypoglycemic events and is able to verbalize appropriate hypoglycemia management plan. Patient reports adherence with medication and believes her readings have improve because she is now consistently taking her medications.  Control is suboptimal due to lack of mealtime insulin (unwillingness of patient to take additional injections) or inability to afford additional medications. .Continued basal insulin Tresiba (insulin deglutec) at 50 units once daily.  Samples provided.  Willing to consider Jardiance (empagliflozin) in addition to current therapy in the future.   Unwilling to consider use of invokana. Asked patient to contact us for an additional visit as soon as she hears from her Medicaid application.   Tobacco abuse.   No longer taking NRT and is currently uninterested in quit attempt.  Reinforced and emphasized importance of tobacco cessation.   Hypertension currently at goal with use of lisinopril.  Written patient instructions provided.  Total time in face to face counseling 25 minutes.   Follow up in Pharmacist Clinic Visit PRN.   Patient seen with Alphonzo Severanceyan Ragan, PharmD Candidate and Allie BossierApryl Anderson, PharmD PGY1 Resident.   Phone Call on 04/07/2016 - found out she was denied Medicaid Coverage.    Patient aware she will need to take her letter to MAP this week and sign her request for MAP medications.   She is willing to start Jardiance - 10mg  daily AND continue Tresiba 50 units once daily.   Sent prescriptions for both [place] and Tresiba via fax to MAP.

## 2016-04-03 NOTE — Assessment & Plan Note (Signed)
Tobacco abuse.  No longer taking NRT and is currently uninterested in quit attempt.  Reinforced and emphasized importance of tobacco cessation.

## 2016-04-03 NOTE — Patient Instructions (Signed)
Continue taking 50 units of Tresiba daily.    Keep up the good work.  Stay positive.    Let me know when you get your letter.

## 2016-04-03 NOTE — Assessment & Plan Note (Signed)
Diabetes longstanding currently at similar level in the past based on home reported readings.   Patient denies hypoglycemic events and is able to verbalize appropriate hypoglycemia management plan. Patient reports adherence with medication and believes her readings have improve because she is now consistently taking her medications.  Control is suboptimal due to lack of mealtime insulin (unwillingness of patient to take additional injections) or inability to afford additional medications. .Continued basal insulin Tresiba (insulin deglutec) at 50 units once daily.

## 2016-04-04 NOTE — Progress Notes (Signed)
Patient ID: Kristen Garrett, female   DOB: 10/21/1960, 55 y.o.   MRN: [id number] Reviewed: Agree with Dr. Koval's documentation and management.   

## 2016-04-07 ENCOUNTER — Telehealth: Payer: Self-pay | Admitting: Pharmacist

## 2016-04-07 MED ORDER — EMPAGLIFLOZIN 10 MG PO TABS: 10.0000 mg | ORAL_TABLET | Freq: Every day | ORAL | 3 refills | Status: DC

## 2016-04-07 MED ORDER — INSULIN DEGLUDEC 100 UNIT/ML ~~LOC~~ SOPN
50.0000 [IU] | PEN_INJECTOR | Freq: Every day | SUBCUTANEOUS | 11 refills | Status: DC
Start: 2016-04-07 — End: 2017-04-30

## 2016-04-07 NOTE — Telephone Encounter (Signed)
Pt received denial letter. She would like for dr Raymondo Bandkoval to call her

## 2016-04-07 NOTE — Telephone Encounter (Signed)
Medicaid denial letter discussed and plan to get MAP for Jardiance AND [name] [facility]   Patient aware she needs to go to [place] [facility] this week to complete paperwork.  Anticipate she will need Tresiba samples for 4 more weeks.

## 2016-04-07 NOTE — Addendum Note (Signed)
Addended by: Kathrin RuddyKOVAL, PETER G on: 04/07/2016 04:20 PM   Modules accepted: Orders

## 2016-04-18 ENCOUNTER — Telehealth: Payer: Self-pay | Admitting: Pharmacist

## 2016-04-18 NOTE — Telephone Encounter (Signed)
Communicated to patient that we did NOT have any [place] yet.  She was offerred a single Lantus Pen to cover her for the weekend.   She reported she had enough OLD lantus to cover her for the weekend.  She was willing to check with us in 3-4 days (early next week).     Will attempt to supply Tresiba samles to this patient at that time.

## 2016-04-28 ENCOUNTER — Telehealth: Payer: Self-pay | Admitting: Family Medicine

## 2016-04-28 NOTE — Telephone Encounter (Signed)
Will forward to Dr. Koval 

## 2016-04-28 NOTE — Telephone Encounter (Signed)
Pr is calling to see if her insulin has been delivered to us so that she can pick this up. Please let her know. jw

## 2016-04-30 NOTE — Telephone Encounter (Signed)
Shared update on Tresiba availability.  She was offerred the Lantus pen for a short supply.  She verbalized understanding.   She also verbalized that her husband fractured his leg in two places and her stress has been very high this this event.    She was pleasant and grateful for the followup.   MAP supply of Tresiba anticipated in ~ 2 weeks.

## 2016-05-07 ENCOUNTER — Telehealth: Payer: Self-pay | Admitting: Pharmacist

## 2016-05-07 NOTE — Telephone Encounter (Signed)
Patient was informed we were still out of supply for TResiba samples.  She was offerred Lantus and states she was not interested in this.  She was told by MAP that her [name] supply will take at least 3-4 more weeks.   At this time, she denies nocturia of > 1 x per night.   She states the stress of her husband's health (recent leg fracture requiring her to help him with mobility) has been OK.   She denies high levels of stress.

## 2016-05-14 ENCOUNTER — Telehealth: Payer: Self-pay | Admitting: Family Medicine

## 2016-05-14 NOTE — Telephone Encounter (Signed)
Patient came by office and picked up samples of Tresiba.

## 2016-07-22 ENCOUNTER — Ambulatory Visit (HOSPITAL_COMMUNITY)
Admission: EM | Admit: 2016-07-22 | Discharge: 2016-07-22 | Disposition: A | Discharge status: Home / Self Care | Payer: Self-pay | Attending: Family Medicine | Admitting: Family Medicine

## 2016-07-22 ENCOUNTER — Encounter (HOSPITAL_COMMUNITY): Payer: Self-pay | Admitting: Emergency Medicine

## 2016-07-22 DIAGNOSIS — R42 Dizziness and giddiness: Secondary | ICD-10-CM

## 2016-07-22 LAB — POCT I-STAT, CHEM 8
BUN: 19 mg/dL (ref 6–20)
Calcium, Ion: 1.21 mmol/L (ref 1.15–1.40)
Chloride: 104 mmol/L (ref 101–111)
Creatinine, Ser: 0.7 mg/dL (ref 0.44–1.00)
Glucose, Bld: 359 mg/dL — ABNORMAL HIGH (ref 65–99)
HEMATOCRIT: 38 % (ref 36.0–46.0)
HEMOGLOBIN: 12.9 g/dL (ref 12.0–15.0)
POTASSIUM: 4.4 mmol/L (ref 3.5–5.1)
Sodium: 138 mmol/L (ref 135–145)
TCO2: 22 mmol/L (ref 0–100)

## 2016-07-22 MED ORDER — MECLIZINE HCL 25 MG PO TABS: 25.0000 mg | ORAL_TABLET | Freq: Three times a day (TID) | ORAL | 0 refills | Status: DC | PRN

## 2016-07-22 NOTE — ED Triage Notes (Signed)
[facility] a [facility] that started 5 days ago. The patient denied any N/V/D and stated that it is worse when changing positions.

## 2016-07-22 NOTE — ED Provider Notes (Addendum)
[facility]    CSN: [id number] Arrival date & time: 07/22/16  2010     History   Chief Complaint Chief Complaint  Patient presents with  . Dizziness    HPI Kristen Garrett is a 56 y.o. female.   The history is provided by the patient and the spouse.  Dizziness  Quality:  Room spinning Severity:  Mild Onset quality:  Gradual Duration:  5 days Progression:  Worsening Chronicity:  New Context: bending over   Relieved by:  Being still Ineffective treatments:  Change in position Associated symptoms: no chest pain, no diarrhea, no headaches, no hearing loss, no nausea, no palpitations, no tinnitus, no vomiting and no weakness   Risk factors: multiple medications and new medications   Risk factors: no Meniere's disease     Past Medical History:  Diagnosis Date  . Diabetes mellitus   . DVT of lower limb, acute ([place]) Age 39   Went on blood thinner for a few months  . Hyperlipidemia   . Hyperlipidemia     Patient Active Problem List   Diagnosis Date Noted  . Metatarsalgia of left foot 03/13/2016  . Hyperlipidemia 07/20/2015  . Chronic back pain 02/26/2015  . Intermenstrual bleeding 02/26/2015  . Encounter for tobacco use cessation counseling 11/23/2014  . Edema of left lower extremity 09/18/2014  . Xerosis of skin 07/13/2014  . Onychomycosis 07/13/2014  . Unspecified venous (peripheral) insufficiency 02/14/2013  . Diabetic peripheral neuropathy ([place]) 11/03/2012  . Left breast lump 06/01/2012  . Enlarged thyroid 10/11/2011  . ALLERGIC RHINITIS, SEASONAL 09/07/2009  . DM (diabetes mellitus), type 2, uncontrolled ([place]) 08/27/2006  . TOBACCO DEPENDENCE 08/27/2006  . HYPERTENSION, BENIGN SYSTEMIC 08/27/2006    Past Surgical History:  Procedure Laterality Date  . ENDOMETRIAL ABLATION W/ NOVASURE    . TUBAL LIGATION      OB History    Gravida Para Term Preterm AB Living   '3 2 1 1   2   '$ SAB TAB Ectopic Multiple Live Births                   Home  Medications    Prior to Admission medications   Medication Sig Start Date End Date Taking? Authorizing Provider  acetaminophen (TYLENOL) 650 MG CR tablet Take 1,300 mg by mouth daily as needed for pain.   Yes Historical Provider, MD  aspirin 81 MG chewable tablet Chew 81 mg by mouth daily.     Yes Historical Provider, MD  Blood Glucose Monitoring Suppl (Nicut) w/Device KIT 1 Device by Does not apply route daily. 08/02/15  Yes Zenia Resides, MD  empagliflozin (JARDIANCE) 10 MG TABS tablet Take 10 mg by mouth daily. 04/07/16  Yes Zenia Resides, MD  gabapentin (NEURONTIN) 300 MG capsule Take 2 capsules (600 mg total) by mouth 3 (three) times daily. 03/08/16  Yes Katheren Shams, DO  glucose blood (RELION GLUCOSE TEST STRIPS) test strip Use as instructed 08/02/15  Yes Zenia Resides, MD  insulin degludec (TRESIBA FLEXTOUCH) 100 UNIT/ML SOPN FlexTouch Pen Inject 0.5 mLs (50 Units total) into the skin daily. 04/07/16  Yes Zenia Resides, MD  Lancets Misc. (RELION LANCING DEVICE) KIT 1 Device by Does not apply route daily. 08/02/15  Yes Zenia Resides, MD  lisinopril (PRINIVIL,ZESTRIL) 20 MG tablet TAKE TWO TABLETS BY MOUTH ONCE DAILY 02/13/16  Yes Elberta Leatherwood, MD  Multiple Vitamin (MULTIVITAMIN) capsule Take 1 capsule by mouth daily. Reported on  12/31/2015   Yes Historical Provider, MD  ReliOn Ultra Thin Lancets MISC 1 Device by Does not apply route daily. 08/02/15  Yes Zenia Resides, MD  vitamin C (ASCORBIC ACID) 500 MG tablet Take 500 mg by mouth daily. Reported on 12/31/2015   Yes Historical Provider, MD  Insulin Pen Needle (NOVOFINE PLUS) 32G X 4 MM MISC 1 Container by Does not apply route daily. 08/23/15   Zenia Resides, MD  meclizine (ANTIVERT) 25 MG tablet Take 1 tablet (25 mg total) by mouth 3 (three) times daily as needed for dizziness. 07/22/16   Billy Fischer, MD  pravastatin (PRAVACHOL) 40 MG tablet Take 1 tablet (40 mg total) by mouth daily. 10/25/15   Elberta Leatherwood, MD      Family History Family History  Problem Relation Age of Onset  . Diabetes Mother   . Hypertension Mother   . Hyperlipidemia Mother   . Diabetes Father   . Hyperlipidemia Father     Social History Social History  Substance Use Topics  . Smoking status: Current Every Day Smoker    Packs/day: 0.50    Types: Cigarettes  . Smokeless tobacco: Never Used     Comment: decreased smoking 7 per day  . Alcohol use No     Allergies   Glipizide and Metformin and related   Review of Systems Review of Systems  Constitutional: Negative.   HENT: Negative.  Negative for hearing loss and tinnitus.   Respiratory: Negative.   Cardiovascular: Negative.  Negative for chest pain and palpitations.  Gastrointestinal: Negative for diarrhea, nausea and vomiting.  Neurological: Positive for dizziness. Negative for weakness and headaches.  All other systems reviewed and are negative.    Physical Exam Triage Vital Signs ED Triage Vitals  Enc Vitals Group     BP 07/22/16 1017 134/87     Pulse Rate 07/22/16 1017 80     Resp 07/22/16 1017 18     Temp 07/22/16 1017 98.2 F (36.8 C)     Temp Source 07/22/16 1017 Oral     SpO2 07/22/16 1017 100 %     Weight --      Height --      Head Circumference --      Peak Flow --      Pain Score 07/22/16 1020 0     Pain Loc --      Pain Edu? --      Excl. in [place]? --    Orthostatic VS for the past 24 hrs:  BP- Lying Pulse- Lying BP- Sitting Pulse- Sitting BP- Standing at 0 minutes Pulse- Standing at 0 minutes  07/22/16 1012 125/80 70 134/87 79 127/84 85    Updated Vital Signs BP 134/87 (BP Location: Right Arm)   Pulse 80   Temp 98.2 F (36.8 C) (Oral)   Resp 18   LMP 02/17/2015   SpO2 100%   Visual Acuity Right Eye Distance:   Left Eye Distance:   Bilateral Distance:    Right Eye Near:   Left Eye Near:    Bilateral Near:     Physical Exam  Constitutional: She is oriented to person, place, and time. She appears well-developed and  well-nourished. No distress.  HENT:  Right Ear: External ear normal.  Left Ear: External ear normal.  Nose: Nose normal.  Mouth/Throat: Oropharynx is clear and moist.  Eyes: EOM are normal. Pupils are equal, round, and reactive to light.  Neck: Normal range of motion. Neck  supple.  Cardiovascular: Normal rate, regular rhythm, normal heart sounds and intact distal pulses.   Pulmonary/Chest: Effort normal and breath sounds normal.  Lymphadenopathy:    She has no cervical adenopathy.  Neurological: She is alert and oriented to person, place, and time. No cranial nerve deficit. Coordination normal.  Skin: Skin is warm and dry.  Nursing note and vitals reviewed.    UC Treatments / Results  Labs (all labs ordered are listed, but only abnormal results are displayed) Labs Reviewed  POCT I-STAT, CHEM 8 - Abnormal; Notable for the following:       Result Value   Glucose, Bld 359 (*)    All other components within normal limits   I-stat bs 359 otherwise nl. EKG  EKG Interpretation None       Radiology No results found.  Procedures Procedures (including critical care time)  Medications Ordered in UC Medications - No data to display   Initial Impression / Assessment and Plan / UC Course  I have reviewed the triage vital signs and the nursing notes.  Pertinent labs & imaging results that were available during my care of the patient were reviewed by me and considered in my medical decision making (see chart for details).       Final Clinical Impressions(s) / UC Diagnoses   Final diagnoses:  Dizziness    New Prescriptions New Prescriptions   MECLIZINE (ANTIVERT) 25 MG TABLET    Take 1 tablet (25 mg total) by mouth 3 (three) times daily as needed for dizziness.     Billy Fischer, MD 07/22/16 [place], MD 07/22/16 [id number]

## 2016-07-22 NOTE — ED Notes (Signed)
Patient was negative for slurred speech, facial droop or arm drift.

## 2016-07-22 NOTE — Discharge Instructions (Signed)
Drink lots of water, see your doctor regarding your sugar.of 359

## 2016-08-04 ENCOUNTER — Telehealth: Payer: Self-pay | Admitting: Family Medicine

## 2016-08-04 NOTE — Telephone Encounter (Signed)
No answer. Left a message asking patient to call back regarding an appointment.

## 2016-08-26 ENCOUNTER — Encounter (HOSPITAL_COMMUNITY): Payer: Self-pay | Admitting: Emergency Medicine

## 2016-08-26 ENCOUNTER — Ambulatory Visit (HOSPITAL_COMMUNITY)
Admission: EM | Admit: 2016-08-26 | Discharge: 2016-08-26 | Disposition: A | Discharge status: Home / Self Care | Payer: Self-pay | Attending: Family Medicine | Admitting: Family Medicine

## 2016-08-26 DIAGNOSIS — H9202 Otalgia, left ear: Secondary | ICD-10-CM

## 2016-08-26 HISTORY — DX: Dizziness and giddiness: R42

## 2016-08-26 MED ORDER — HYDROCODONE-ACETAMINOPHEN 5-325 MG PO TABS: 1.0000 | ORAL_TABLET | ORAL | 0 refills | Status: DC | PRN

## 2016-08-26 MED ORDER — CLINDAMYCIN HCL 150 MG PO CAPS: 150.0000 mg | ORAL_CAPSULE | Freq: Four times a day (QID) | ORAL | 0 refills | Status: DC

## 2016-08-26 NOTE — Discharge Instructions (Signed)
See ear doctor if further problem

## 2016-08-26 NOTE — ED Triage Notes (Signed)
[facility] a [facility] left ear pain that radiates into her jaw that started 3 days ago.

## 2016-08-26 NOTE — ED Provider Notes (Signed)
[place]    CSN: [id number] Arrival date & time: 08/26/16  1001     History   Chief Complaint Chief Complaint  Patient presents with  . Otalgia    HPI Kristen Garrett is a 56 y.o. female.   The history is provided by the patient and the spouse.  Otalgia  Location:  Left Quality:  Aching Severity:  Moderate Onset quality:  Sudden Duration:  3 days Progression:  Worsening Chronicity:  New Relieved by:  Nothing Worsened by:  Nothing Associated symptoms: no ear discharge and no fever     Past Medical History:  Diagnosis Date  . Diabetes mellitus   . DVT of lower limb, acute ([place]) Age 39   Went on blood thinner for a few months  . Hyperlipidemia   . Hyperlipidemia   . Vertigo     Patient Active Problem List   Diagnosis Date Noted  . Metatarsalgia of left foot 03/13/2016  . Hyperlipidemia 07/20/2015  . Chronic back pain 02/26/2015  . Intermenstrual bleeding 02/26/2015  . Encounter for tobacco use cessation counseling 11/23/2014  . Edema of left lower extremity 09/18/2014  . Xerosis of skin 07/13/2014  . Onychomycosis 07/13/2014  . Unspecified venous (peripheral) insufficiency 02/14/2013  . Diabetic peripheral neuropathy ([place]) 11/03/2012  . Left breast lump 06/01/2012  . Enlarged thyroid 10/11/2011  . ALLERGIC RHINITIS, SEASONAL 09/07/2009  . DM (diabetes mellitus), type 2, uncontrolled ([place]) 08/27/2006  . TOBACCO DEPENDENCE 08/27/2006  . HYPERTENSION, BENIGN SYSTEMIC 08/27/2006    Past Surgical History:  Procedure Laterality Date  . ENDOMETRIAL ABLATION W/ NOVASURE    . TUBAL LIGATION      OB History    Gravida Para Term Preterm AB Living   '3 2 1 1   2   '$ SAB TAB Ectopic Multiple Live Births                   Home Medications    Prior to Admission medications   Medication Sig Start Date End Date Taking? Authorizing Provider  acetaminophen (TYLENOL) 650 MG CR tablet Take 1,300 mg by mouth daily as needed for pain.    Historical  Provider, MD  aspirin 81 MG chewable tablet Chew 81 mg by mouth daily.      Historical Provider, MD  Blood Glucose Monitoring Suppl ([place]) w/Device KIT 1 Device by Does not apply route daily. 08/02/15   Zenia Resides, MD  clindamycin (CLEOCIN) 150 MG capsule Take 1 capsule (150 mg total) by mouth 4 (four) times daily. 08/26/16   Billy Fischer, MD  empagliflozin (JARDIANCE) 10 MG TABS tablet Take 10 mg by mouth daily. 04/07/16   Zenia Resides, MD  gabapentin (NEURONTIN) 300 MG capsule Take 2 capsules (600 mg total) by mouth 3 (three) times daily. 03/08/16   Katheren Shams, DO  glucose blood (RELION GLUCOSE TEST STRIPS) test strip Use as instructed 08/02/15   Zenia Resides, MD  HYDROcodone-acetaminophen (NORCO/VICODIN) 5-325 MG tablet Take 1 tablet by mouth every 4 (four) hours as needed. 08/26/16   Billy Fischer, MD  insulin degludec (TRESIBA FLEXTOUCH) 100 UNIT/ML SOPN FlexTouch Pen Inject 0.5 mLs (50 Units total) into the skin daily. 04/07/16   Zenia Resides, MD  Insulin Pen Needle (NOVOFINE PLUS) 32G X 4 MM MISC 1 Container by Does not apply route daily. 08/23/15   Zenia Resides, MD  Lancets Misc. (RELION LANCING DEVICE) KIT 1 Device by Does not apply  route daily. 08/02/15   Moses Manners, MD  lisinopril (PRINIVIL,ZESTRIL) 20 MG tablet TAKE TWO TABLETS BY MOUTH ONCE DAILY 02/13/16   Kathee Delton, MD  meclizine (ANTIVERT) 25 MG tablet Take 1 tablet (25 mg total) by mouth 3 (three) times daily as needed for dizziness. 07/22/16   Linna Hoff, MD  Multiple Vitamin (MULTIVITAMIN) capsule Take 1 capsule by mouth daily. Reported on 12/31/2015    Historical Provider, MD  pravastatin (PRAVACHOL) 40 MG tablet Take 1 tablet (40 mg total) by mouth daily. 10/25/15   Kathee Delton, MD  ReliOn Ultra Thin Lancets MISC 1 Device by Does not apply route daily. 08/02/15   Moses Manners, MD  vitamin C (ASCORBIC ACID) 500 MG tablet Take 500 mg by mouth daily. Reported on 12/31/2015    Historical  Provider, MD    Family History Family History  Problem Relation Age of Onset  . Diabetes Mother   . Hypertension Mother   . Hyperlipidemia Mother   . Diabetes Father   . Hyperlipidemia Father     Social History Social History  Substance Use Topics  . Smoking status: Current Every Day Smoker    Packs/day: 0.50    Types: Cigarettes  . Smokeless tobacco: Never Used     Comment: decreased smoking 7 per day  . Alcohol use No     Allergies   Glipizide and Metformin and related   Review of Systems Review of Systems  Constitutional: Negative for fever.  HENT: Positive for ear pain. Negative for dental problem, ear discharge and facial swelling.   All other systems reviewed and are negative.    Physical Exam Triage Vital Signs ED Triage Vitals [08/26/16 1011]  Enc Vitals Group     BP 122/81     Pulse Rate 85     Resp 18     Temp 98.2 F (36.8 C)     Temp Source Oral     SpO2 100 %     Weight      Height      Head Circumference      Peak Flow      Pain Score 8     Pain Loc      Pain Edu?      Excl. in GC?    No data found.   Updated Vital Signs BP 122/81 (BP Location: Right Arm)   Pulse 85   Temp 98.2 F (36.8 C) (Oral)   Resp 18   LMP 02/17/2015   SpO2 100%   Visual Acuity Right Eye Distance:   Left Eye Distance:   Bilateral Distance:    Right Eye Near:   Left Eye Near:    Bilateral Near:     Physical Exam  Constitutional: She appears well-developed and well-nourished.  HENT:  Right Ear: External ear normal.  Left Ear: There is tenderness. There is mastoid tenderness. Tympanic membrane is not injected, not scarred, not perforated and not erythematous.  Ears:  Nose: Nose normal.  Eyes: Pupils are equal, round, and reactive to light.  Neck: Normal range of motion. Neck supple.  Lymphadenopathy:    She has no cervical adenopathy.  Nursing note and vitals reviewed.    UC Treatments / Results  Labs (all labs ordered are listed, but  only abnormal results are displayed) Labs Reviewed - No data to display  EKG  EKG Interpretation None       Radiology No results found.  Procedures Procedures (including critical  care time)  Medications Ordered in UC Medications - No data to display   Initial Impression / Assessment and Plan / UC Course  I have reviewed the triage vital signs and the nursing notes.  Pertinent labs & imaging results that were available during my care of the patient were reviewed by me and considered in my medical decision making (see chart for details).       Final Clinical Impressions(s) / UC Diagnoses   Final diagnoses:  Otalgia of left ear    New Prescriptions Discharge Medication List as of 08/26/2016 10:38 AM    START taking these medications   Details  clindamycin (CLEOCIN) 150 MG capsule Take 1 capsule (150 mg total) by mouth 4 (four) times daily., Starting Tue 08/26/2016, Print    HYDROcodone-acetaminophen (NORCO/VICODIN) 5-325 MG tablet Take 1 tablet by mouth every 4 (four) hours as needed., Starting Tue 08/26/2016, Print         Billy Fischer, MD 08/26/16 1226

## 2017-02-16 ENCOUNTER — Other Ambulatory Visit: Payer: Self-pay | Admitting: Family Medicine

## 2017-02-19 ENCOUNTER — Other Ambulatory Visit: Payer: Self-pay | Admitting: *Deleted

## 2017-02-19 NOTE — Telephone Encounter (Signed)
Appt for Monday 02/23/17. Clovis Pu, RN

## 2017-02-23 ENCOUNTER — Encounter: Payer: Self-pay | Admitting: Family Medicine

## 2017-02-23 ENCOUNTER — Ambulatory Visit (INDEPENDENT_AMBULATORY_CARE_PROVIDER_SITE_OTHER): Payer: Self-pay | Admitting: Family Medicine

## 2017-02-23 VITALS — BP 130/76 | HR 103 | Temp 98.6°F | Ht 66.5 in | Wt 200.2 lb

## 2017-02-23 DIAGNOSIS — IMO0002 Reserved for concepts with insufficient information to code with codable children: Secondary | ICD-10-CM

## 2017-02-23 DIAGNOSIS — B351 Tinea unguium: Secondary | ICD-10-CM

## 2017-02-23 DIAGNOSIS — E119 Type 2 diabetes mellitus without complications: Secondary | ICD-10-CM

## 2017-02-23 DIAGNOSIS — E785 Hyperlipidemia, unspecified: Secondary | ICD-10-CM

## 2017-02-23 DIAGNOSIS — Z794 Long term (current) use of insulin: Secondary | ICD-10-CM

## 2017-02-23 DIAGNOSIS — L739 Follicular disorder, unspecified: Secondary | ICD-10-CM

## 2017-02-23 DIAGNOSIS — E1142 Type 2 diabetes mellitus with diabetic polyneuropathy: Secondary | ICD-10-CM

## 2017-02-23 DIAGNOSIS — E1165 Type 2 diabetes mellitus with hyperglycemia: Secondary | ICD-10-CM

## 2017-02-23 LAB — POCT GLYCOSYLATED HEMOGLOBIN (HGB A1C): HEMOGLOBIN A1C: 12.4

## 2017-02-23 MED ORDER — CETIRIZINE HCL 10 MG PO TABS: 10.0000 mg | ORAL_TABLET | Freq: Every day | ORAL | 11 refills | Status: DC

## 2017-02-23 MED ORDER — TERBINAFINE HCL 250 MG PO TABS: 250.0000 mg | ORAL_TABLET | Freq: Every day | ORAL | 0 refills | Status: AC

## 2017-02-23 MED ORDER — LISINOPRIL 20 MG PO TABS: 40.0000 mg | ORAL_TABLET | Freq: Every day | ORAL | 11 refills | Status: DC

## 2017-02-23 NOTE — Progress Notes (Deleted)
    Subjective:  Kristen Garrett is a 56 y.o. female who presents to the [facility] today with a chief complaint of ***.   HPI:  ***  ***  PMH: *** Tobacco use: *** Medication: reviewed and updated ROS: see HPI   Objective:  Physical Exam: BP 130/76   Pulse (!) 103   Temp 98.6 F (37 C) (Oral)   Ht 5' 6.5" (1.689 m)   Wt 200 lb 3.2 oz (90.8 kg)   LMP 02/17/2015   SpO2 96%   BMI 31.83 kg/m   Gen: ***NAD, resting comfortably CV: RRR with no murmurs appreciated Pulm: NWOB, CTAB with no crackles, wheezes, or rhonchi GI: Normal bowel sounds present. Soft, Nontender, Nondistended. MSK: no edema, cyanosis, or clubbing noted Skin: warm, dry Neuro: grossly normal, moves all extremities Psych: Normal affect and thought content  No results found for this or any previous visit (from the past 72 hour(s)).   Assessment/Plan:  No problem-specific Assessment & Plan notes found for this encounter.

## 2017-02-23 NOTE — Progress Notes (Signed)
Subjective:  Kristen Garrett is a 56 y.o. female who presents to the [facility] today with a chief complaint of toenail fungus and rash on back.   HPI:  DIABETES Type II Medications: Reports taking and tolerating without side effects. Blood Sugars per patient: not taking Diet-not following diabetic diet Regular Exercise-exercises infrequently  Health Maintenance Due  Topic Date Due  . Hepatitis C Screening  01/22/63  . PNEUMOCOCCAL POLYSACCHARIDE VACCINE (1) 10/11/1964  . HIV Screening  10/11/1977  . COLONOSCOPY  10/11/2012  . MAMMOGRAM  06/16/2014  . FOOT EXAM  07/27/2014  . OPHTHALMOLOGY EXAM  08/10/2014  . PAP SMEAR  02/01/2016  . INFLUENZA VACCINE  01/28/2017   On Aspirin-yes, 65m On statin-prescribed pravastatin 466m but does not take Daily foot monitoring-yes Last eye exam: due Last foot exam: due Nephropathy screen: due  ROS- Denies Polyuria,Polydipsia, nocturia, Vision changes, feet or hand numbness/pain/tingling. Denies Hypoglycemia symptoms (shaky, sweaty, hungry, weak anxious, tremor, palpitations, confusion, behavior change).   Hemoglobin a1c:  Lab Results  Component Value Date   HGBA1C 12.4 02/23/2017   HGBA1C 11.9 02/14/2016   HGBA1C 11.8 10/25/2015   Rash: Itchy rash on back for past month. Denies any new detergents, knowledge of bed bugs. Nothing like this in the past. Wonders if this might be a "diabetes rash".  No erythema, drainage, tenderness or swelling. Very itchy. Denies any other lesions, fevers, chills, nausea, vomiting or diarrhea.   Toenail thickening: Ongoing thickening of great toes bilaterally. Concerned about appearance. No pain, erythema, drainage, fevers, chills, NVD. Wants to start treatment.   PMH: poorly controlled diabetes Tobacco use reviewed Medication: reviewed and updated ROS: see HPI   Objective:  Physical Exam: BP 130/76   Pulse (!) 103   Temp 98.6 F (37 C) (Oral)   Ht 5' 6.5" (1.689 m)   Wt 200 lb 3.2 oz (90.8 kg)    LMP 02/17/2015   SpO2 96%   BMI 31.83 kg/m   Gen: 56yo F in NAD, resting comfortably CV: RRR with no murmurs appreciated Pulm: NWOB, CTAB with no crackles, wheezes, or rhonchi GI: Normal bowel sounds present. Soft, Nontender, Nondistended. MSK: no edema, cyanosis, or clubbing noted Skin: warm, dry, multiple hyperpigmented circular inflammatory lesions scattered on back with erythema, warmth, drainage or TTP. Discoloration and thickened nails on bilateral first toe.  Neuro: grossly normal, moves all extremities Psych: Normal affect and thought content  Results for orders placed or performed in visit on 02/23/17 (from the past 72 hour(s))  CBC     Status: Abnormal   Collection Time: 02/23/17  3:06 PM  Result Value Ref Range   WBC 9.4 3.4 - 10.8 x10E3/uL   RBC 5.19 3.77 - 5.28 x10E6/uL   Hemoglobin 13.1 11.1 - 15.9 g/dL   Hematocrit 40.4 34.0 - 46.6 %   MCV 78 (L) 79 - 97 fL   MCH 25.2 (L) 26.6 - 33.0 pg   MCHC 32.4 31.5 - 35.7 g/dL   RDW 15.8 (H) 12.3 - 15.4 %   Platelets 250 150 - 379 x10E3/uL  TSH     Status: None   Collection Time: 02/23/17  3:06 PM  Result Value Ref Range   TSH 1.370 0.450 - 4.500 uIU/mL  CMP14+EGFR     Status: Abnormal   Collection Time: 02/23/17  3:06 PM  Result Value Ref Range   Glucose 297 (H) 65 - 99 mg/dL   BUN 15 6 - 24 mg/dL   Creatinine, Ser 0.86 0.57 -  1.00 mg/dL   GFR calc non Af Amer 77 >59 mL/min/1.73   GFR calc Af Amer 89 >59 mL/min/1.73   BUN/Creatinine Ratio 17 9 - 23   Sodium 139 134 - 144 mmol/L   Potassium 4.7 3.5 - 5.2 mmol/L   Chloride 103 96 - 106 mmol/L   CO2 21 20 - 29 mmol/L   Calcium 9.4 8.7 - 10.2 mg/dL   Total Protein 6.8 6.0 - 8.5 g/dL   Albumin 4.1 3.5 - 5.5 g/dL   Globulin, Total 2.7 1.5 - 4.5 g/dL   Albumin/Globulin Ratio 1.5 1.2 - 2.2   Bilirubin Total <0.2 0.0 - 1.2 mg/dL   Alkaline Phosphatase 141 (H) 39 - 117 IU/L   AST 14 0 - 40 IU/L   ALT 16 0 - 32 IU/L  Lipid panel     Status: Abnormal   Collection Time:  02/23/17  3:06 PM  Result Value Ref Range   Cholesterol, Total 267 (H) 100 - 199 mg/dL   Triglycerides 665 (HH) 0 - 149 mg/dL   HDL 30 (L) >39 mg/dL   VLDL Cholesterol Cal Comment 5 - 40 mg/dL    Comment: The calculation for the VLDL cholesterol is not valid when triglyceride level is >400 mg/dL.    LDL Calculated Comment 0 - 99 mg/dL    Comment: Triglyceride result indicated is too high for an accurate LDL cholesterol estimation.    Chol/HDL Ratio 8.9 (H) 0.0 - 4.4 ratio    Comment:                                   T. Chol/HDL Ratio                                             Men  Women                               1/2 Avg.Risk  3.4    3.3                                   Avg.Risk  5.0    4.4                                2X Avg.Risk  9.6    7.1                                3X Avg.Risk 23.4   11.0   Specimen status report     Status: None   Collection Time: 02/23/17  3:06 PM  Result Value Ref Range   specimen status report Comment     Comment: Written Authorization Written Authorization Written Authorization Received. Authorization received from [place] 02-25-2017 Logged by Pecolia Ades   HgB A1c     Status: None   Collection Time: 02/23/17  3:17 PM  Result Value Ref Range   Hemoglobin A1C 12.4      Assessment/Plan:  DM (diabetes mellitus), type 2, uncontrolled ([place]) Poorly controlled on jardiance 59m daily and tresiba 50U daily.  Denies hypoglycemic episodes and able to verbalize hypoglyemic plan.  A1C elevated to 12.4 today up from 11.9 last year. Does not seem to be very concerned about elevated A1C.  Stated that she "found a regimen that works for her".  Dr. Graylin Shiver notes nicely document her unwillingness to participate in getting control of her diabetes. Also should be on pravastatin, but has never picked it up. Cholesterol >600 today. Could be error. Per comments "Triglyceride result indicated is too high for accurate LDL cholesterol estimation".  Also a  smoker. High risk for stroke and CAD.  Called patient to discuss results. Number on file is incorrect.  Sent patient letter recommending she come back to office for education and to change insulin regimen.   Onychomycosis History of onychomycosis and currently concerned about discoloration of toenails on big toes bilaterally. Has previously thrown out prescription for terbinafine due to concerns about side effects. Prescribed terbinafine 250 mg daily. Will likely need 3 month course. Baseline LFTs normal.  - Terbinafine 250 mg daily, dispensed 6 week course - Follow-up in 6 weeks and check LFTs  Folliculitis Likely folliculitis on back. No signs of active infection.  - zyrtec for symptomatic [place] maintenance Patient due for a number of health maintenance items. Currently does not have health insurance and is self-pay. Most important thing right now for her health is controlling her type 2 diabetes and hyperlipidemia. Phone number on file is not accurate. Sent patient letter requesting that she call the office and make an appointment so that we can address some of these pressing issues.   Daniel L. Rosalyn Gess, [facility] Resident PGY-2 02/26/2017 2:05 PM

## 2017-02-23 NOTE — Patient Instructions (Addendum)
Kristen Garrett, you were seen today for a diabetes follow up, nail fungus and rash on your back.    We will be rechecking your hemoglobin A1C today.  I will call you back with the results.  We may need to change your medications depending on the result.  I am starting you on a medication called terbinafine once a day for your fungus.  I gave you 6 weeks worth of medicine. You will need 3 months.  You need to follow up in 6 weeks and we will check your liver levels and if they are normal we can continue the treatment.   Your rash on your back is likely folliculitis. I prescribed you a medication called zyrtec. This can help with the itching.    It was very nice meeting you today [name] Myrtie Soman, MD [facility] Resident PGY-2 02/23/2017 3:08 PM

## 2017-02-24 LAB — CMP14+EGFR
A/G RATIO: 1.5 (ref 1.2–2.2)
ALBUMIN: 4.1 g/dL (ref 3.5–5.5)
ALT: 16 IU/L (ref 0–32)
AST: 14 IU/L (ref 0–40)
Alkaline Phosphatase: 141 IU/L — ABNORMAL HIGH (ref 39–117)
BUN / CREAT RATIO: 17 (ref 9–23)
BUN: 15 mg/dL (ref 6–24)
CHLORIDE: 103 mmol/L (ref 96–106)
CO2: 21 mmol/L (ref 20–29)
Calcium: 9.4 mg/dL (ref 8.7–10.2)
Creatinine, Ser: 0.86 mg/dL (ref 0.57–1.00)
GFR calc non Af Amer: 77 mL/min/{1.73_m2} (ref >59)
GFR, EST AFRICAN AMERICAN: 89 mL/min/{1.73_m2} (ref >59)
Globulin, Total: 2.7 g/dL (ref 1.5–4.5)
Glucose: 297 mg/dL — ABNORMAL HIGH (ref 65–99)
POTASSIUM: 4.7 mmol/L (ref 3.5–5.2)
SODIUM: 139 mmol/L (ref 134–144)
TOTAL PROTEIN: 6.8 g/dL (ref 6.0–8.5)

## 2017-02-24 LAB — CBC
HEMATOCRIT: 40.4 % (ref 34.0–46.6)
HEMOGLOBIN: 13.1 g/dL (ref 11.1–15.9)
MCH: 25.2 pg — AB (ref 26.6–33.0)
MCHC: 32.4 g/dL (ref 31.5–35.7)
MCV: 78 fL — ABNORMAL LOW (ref 79–97)
Platelets: 250 10*3/uL (ref 150–379)
RBC: 5.19 x10E6/uL (ref 3.77–5.28)
RDW: 15.8 % — AB (ref 12.3–15.4)
WBC: 9.4 10*3/uL (ref 3.4–10.8)

## 2017-02-24 LAB — TSH: TSH: 1.37 u[IU]/mL (ref 0.450–4.500)

## 2017-02-26 ENCOUNTER — Encounter: Payer: Self-pay | Admitting: Family Medicine

## 2017-02-26 LAB — LIPID PANEL
CHOLESTEROL TOTAL: 267 mg/dL — AB (ref 100–199)
Chol/HDL Ratio: 8.9 ratio — ABNORMAL HIGH (ref 0.0–4.4)
HDL: 30 mg/dL — ABNORMAL LOW (ref >39)
Triglycerides: 665 mg/dL (ref 0–149)

## 2017-02-26 LAB — SPECIMEN STATUS REPORT

## 2017-02-26 NOTE — Assessment & Plan Note (Signed)
History of onychomycosis and currently concerned about discoloration of toenails on big toes bilaterally. Has previously thrown out prescription for terbinafine due to concerns about side effects. Prescribed terbinafine 250 mg daily. Will likely need 3 month course. Baseline LFTs normal.  - Terbinafine 250 mg daily, dispensed 6 week course - Follow-up in 6 weeks and check LFTs

## 2017-02-26 NOTE — Assessment & Plan Note (Signed)
Poorly controlled on jardiance 10mg  daily and tresiba 50U daily. Denies hypoglycemic episodes and able to verbalize hypoglyemic plan.  A1C elevated to 12.4 today up from 11.9 last year. Does not seem to be very concerned about elevated A1C.  Stated that she "found a regimen that works for her".  Dr. Macky LowerKoval's notes nicely document her unwillingness to participate in getting control of her diabetes. Also should be on pravastatin, but has never picked it up. Cholesterol >600 today. Could be error. Per comments "Triglyceride result indicated is too high for accurate LDL cholesterol estimation".  Also a smoker. High risk for stroke and CAD.  Called patient to discuss results. Number on file is incorrect.  Sent patient letter recommending she come back to office for education and to change insulin regimen.

## 2017-03-20 ENCOUNTER — Other Ambulatory Visit: Payer: Self-pay | Admitting: Family Medicine

## 2017-03-20 NOTE — Telephone Encounter (Signed)
Patient is out of medication.  Martin, Tamika L, RN  

## 2017-03-23 NOTE — Telephone Encounter (Signed)
This patient needs to come in for diabetes follow up. We do not have an accurate phone number on file.  If she calls again please take down her number.  Thanks! Antoine Vandermeulen L. Myrtie Soman, MD [facility] Resident PGY-2 03/23/2017 9:15 AM

## 2017-03-24 ENCOUNTER — Telehealth: Payer: Self-pay

## 2017-03-24 ENCOUNTER — Other Ambulatory Visit: Payer: Self-pay | Admitting: Family Medicine

## 2017-03-24 NOTE — Telephone Encounter (Signed)
Patient is out of medication.  Hollin Crewe L, RN  

## 2017-03-24 NOTE — Telephone Encounter (Signed)
If pt calls, please get correct phone number. The number listed is not correct. Sunday Spillers, CMA

## 2017-03-25 ENCOUNTER — Other Ambulatory Visit: Payer: Self-pay | Admitting: *Deleted

## 2017-03-25 MED ORDER — GABAPENTIN 300 MG PO CAPS: 600.0000 mg | ORAL_CAPSULE | Freq: Three times a day (TID) | ORAL | 0 refills | Status: DC

## 2017-04-20 ENCOUNTER — Other Ambulatory Visit: Payer: Self-pay | Admitting: Family Medicine

## 2017-04-28 ENCOUNTER — Telehealth: Payer: Self-pay | Admitting: Family Medicine

## 2017-04-28 NOTE — Telephone Encounter (Signed)
Pt is completely out of her insulin and jardiance.  She took her last insulin shot last night and last jardiance was today.  Refill request is in drs box.  Pt would like to get the refill ASAP

## 2017-04-30 ENCOUNTER — Other Ambulatory Visit: Payer: Self-pay | Admitting: Family Medicine

## 2017-04-30 DIAGNOSIS — Z794 Long term (current) use of insulin: Principal | ICD-10-CM

## 2017-04-30 DIAGNOSIS — E1142 Type 2 diabetes mellitus with diabetic polyneuropathy: Secondary | ICD-10-CM

## 2017-04-30 DIAGNOSIS — IMO0002 Reserved for concepts with insufficient information to code with codable children: Secondary | ICD-10-CM

## 2017-04-30 DIAGNOSIS — E1165 Type 2 diabetes mellitus with hyperglycemia: Principal | ICD-10-CM

## 2017-04-30 MED ORDER — EMPAGLIFLOZIN 10 MG PO TABS: 10.0000 mg | ORAL_TABLET | Freq: Every day | ORAL | 3 refills | Status: DC

## 2017-04-30 MED ORDER — INSULIN DEGLUDEC 100 UNIT/ML ~~LOC~~ SOPN: 50.0000 [IU] | PEN_INJECTOR | Freq: Every day | SUBCUTANEOUS | 11 refills | Status: DC

## 2017-05-12 ENCOUNTER — Ambulatory Visit: Payer: Self-pay

## 2017-05-14 ENCOUNTER — Other Ambulatory Visit: Payer: Self-pay

## 2017-05-14 ENCOUNTER — Encounter (INDEPENDENT_AMBULATORY_CARE_PROVIDER_SITE_OTHER): Payer: Self-pay

## 2017-05-14 ENCOUNTER — Encounter: Payer: Self-pay | Admitting: Internal Medicine

## 2017-05-14 ENCOUNTER — Ambulatory Visit: Payer: Self-pay | Admitting: Internal Medicine

## 2017-05-14 VITALS — BP 120/72 | HR 83 | Temp 98.1°F | Ht 66.5 in | Wt 201.6 lb

## 2017-05-14 DIAGNOSIS — IMO0002 Reserved for concepts with insufficient information to code with codable children: Secondary | ICD-10-CM

## 2017-05-14 DIAGNOSIS — E1142 Type 2 diabetes mellitus with diabetic polyneuropathy: Secondary | ICD-10-CM

## 2017-05-14 DIAGNOSIS — F17200 Nicotine dependence, unspecified, uncomplicated: Secondary | ICD-10-CM

## 2017-05-14 DIAGNOSIS — E669 Obesity, unspecified: Secondary | ICD-10-CM

## 2017-05-14 DIAGNOSIS — Z833 Family history of diabetes mellitus: Secondary | ICD-10-CM

## 2017-05-14 DIAGNOSIS — Z6832 Body mass index (BMI) 32.0-32.9, adult: Secondary | ICD-10-CM

## 2017-05-14 DIAGNOSIS — E785 Hyperlipidemia, unspecified: Secondary | ICD-10-CM

## 2017-05-14 DIAGNOSIS — E1165 Type 2 diabetes mellitus with hyperglycemia: Principal | ICD-10-CM

## 2017-05-14 DIAGNOSIS — Z0189 Encounter for other specified special examinations: Secondary | ICD-10-CM

## 2017-05-14 DIAGNOSIS — Z9851 Tubal ligation status: Secondary | ICD-10-CM

## 2017-05-14 DIAGNOSIS — Z79899 Other long term (current) drug therapy: Secondary | ICD-10-CM

## 2017-05-14 DIAGNOSIS — Z8249 Family history of ischemic heart disease and other diseases of the circulatory system: Secondary | ICD-10-CM

## 2017-05-14 DIAGNOSIS — I10 Essential (primary) hypertension: Secondary | ICD-10-CM

## 2017-05-14 DIAGNOSIS — Z794 Long term (current) use of insulin: Secondary | ICD-10-CM

## 2017-05-14 DIAGNOSIS — F17210 Nicotine dependence, cigarettes, uncomplicated: Secondary | ICD-10-CM

## 2017-05-14 MED ORDER — DULAGLUTIDE 0.75 MG/0.5ML ~~LOC~~ SOAJ: 0.7500 mg | SUBCUTANEOUS | 3 refills | Status: DC

## 2017-05-14 MED ORDER — ATORVASTATIN CALCIUM 80 MG PO TABS: 80.0000 mg | ORAL_TABLET | Freq: Every day | ORAL | 1 refills | Status: DC

## 2017-05-14 MED ORDER — LIRAGLUTIDE 18 MG/3ML ~~LOC~~ SOPN: 1.2000 mg | PEN_INJECTOR | Freq: Every day | SUBCUTANEOUS | 2 refills | Status: DC

## 2017-05-14 NOTE — Assessment & Plan Note (Addendum)
Lab Results  Component Value Date   HGBA1C 12.4 02/23/2017   HGBA1C 11.9 02/14/2016   HGBA1C 11.8 10/25/2015    See HPI for details  A/P: Poorly controlled DM. Discussed various options at length today. Patient is agreeable to continuing the Tresiba at 15 units daily and Jardiance 10 mg daily today. Would need to consider other options for insulin in the future as she would not be amenable to up-titration of this medication due to side effects. She is agreeable to starting Trulicity today.   Asked patient to check her CBGs more frequently at home. Once she is able to get the orange card we can look into getting the CGM set up for her.   Follow up in 2 weeks for re-check.

## 2017-05-14 NOTE — Progress Notes (Signed)
CC: DM, HTN, Establish Care  HPI:  [name] is a 56 y.o. 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 She was previously seen by [facility].   She reports that she has been resistant to medications previously and had only been taking Jardiance 10 mg daily when she last saw her previous PCP in August. She reports that after seeing them at that visit she decided to start taking her prescribed Tresiba 50 units daily. She reports that when taking that dosing she developed significant GI distress with diarrhea. Says that she decreased the dosing to 15 units daily and has not had any side effects. She denies any episodes of hypoglycemia. Reports she rarely checks her blood sugars at home and says they usually are around 200 at home. Most recent A1c 01/2017 was 12.4. Patient reports that she is 'ready to get this sorted,' referring to getting her diabetes controlled. Reports being on metformin and glipizide in the past but unable to tolerate either medications due to side effects. She reports being interested in getting the CGM as she does not like checking her blood sugars. She has already developed peripheral neuropathy for which she takes gabapentin.   He was also noted to have hyperlipidemia and was not taking a statin. Reports that she recently (past 1-2 weeks) started taking her previously prescribed pravastatin 40 mg daily. Denies any side effects currently. ASCVD 10 year risk is 20% with a 50% lifetime risk.   She reports she does not currently have any insurance. Does have medications through he [place] medication assistance program. [facility] and has a meeting scheduled with [employer].  Past Medical History:  Diagnosis Date  . Diabetes mellitus   . DVT of lower limb, acute ([facility]) Age 56   Went  on blood thinner for a few months  . Hyperlipidemia   . Hyperlipidemia   . Vertigo    Past Surgical History:  Procedure Laterality Date  . ENDOMETRIAL ABLATION W/ NOVASURE    . TUBAL LIGATION     Family History  Problem Relation Age of Onset  . Diabetes Mother   . Hypertension Mother   . Hyperlipidemia Mother   . Diabetes Father   . Hyperlipidemia Father    Social History   Socioeconomic History  . Marital status: Married    Spouse name: None  . Number of children: None  . Years of education: None  . Highest education level: None  Social Needs  . Financial resource strain: None  . Food insecurity - worry: None  . Food insecurity - inability: None  . Transportation needs - medical: None  . Transportation needs - non-medical: None  Occupational History  . None  Tobacco Use  . Smoking status: Current Every Day Smoker    Packs/day: 0.50    Types: Cigarettes  . Smokeless tobacco: Never Used  . Tobacco comment: decreased smoking 7 per day  Substance and Sexual Activity  . Alcohol use: No    Alcohol/week: 0.0 oz  . Drug use: No  . Sexual activity: Yes    Comment: tubal--- 1 sexual partner married  Other Topics Concern  . 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    2 grown daughters.  Review of Systems:   Review of Systems  Constitutional: Negative.   HENT: Negative.   Eyes: Negative.   Respiratory: Negative.   Cardiovascular: Negative.   Gastrointestinal: Negative.   Genitourinary: Negative.   Musculoskeletal: Negative.   Skin: Negative.   Neurological: Negative.   Psychiatric/Behavioral: Negative.    Physical Exam:  Vitals:   05/14/17 0910  BP: 120/72  Pulse: 83  Temp: 98.1 F (36.7 C)  TempSrc: Oral  SpO2: 100%  Weight: 201 lb 9.6 oz (91.4 kg)  Height: 5' 6.5" (1.689 m)   Physical Exam  Constitutional: She is oriented to person, place, and time and well-developed,  well-nourished, and in no distress. No distress.  HENT:  Head: Normocephalic and atraumatic.  Eyes: Conjunctivae and EOM are normal. Pupils are equal, round, and reactive to light.  Neck: No JVD present.  Cardiovascular: Normal rate, regular rhythm and normal heart sounds.  Pulmonary/Chest: Effort normal and breath sounds normal. No respiratory distress. She has no wheezes.  Abdominal: Soft. Bowel sounds are normal. She exhibits no distension. There is no tenderness.  Musculoskeletal: She exhibits no edema.  Neurological: She is alert and oriented to person, place, and time.  Skin: Skin is warm and dry. No rash noted.  Psychiatric: Mood and affect normal.     Assessment & Plan:   See Encounters Tab for problem based charting.  Patient discussed with Dr. Sandre Kittyaines

## 2017-05-14 NOTE — Progress Notes (Signed)
Trulicity was reviewed with the patient, including name, instructions, indication, goals of therapy, potential side effects, importance of adherence, and safe use. [facility] contacted [facility]ist to notify switch from [facility].  Patient verbalized understanding by repeating back information and was advised to contact me if further medication-related questions arise. Patient was also provided an information handout.

## 2017-05-14 NOTE — Patient Instructions (Signed)
Ms. Christell ConstantMoore,  Please stop take taking the pravastatin and start the atorvastatin. I am also going to start you on Victoza. Take 0.1 mL (0.6 mg) daily for the for first week and then increase to take 0.2 mL (1.2 mg) daily. Continue the Truciba 15 units every day.  Please come back to see me in 2 weeks.

## 2017-05-14 NOTE — Assessment & Plan Note (Addendum)
BP Readings from Last 3 Encounters:  05/14/17 120/72  02/23/17 130/76  08/26/16 122/81    Lab Results  Component Value Date   NA 139 02/23/2017   K 4.7 02/23/2017   CREATININE 0.86 02/23/2017   BP is well controlled today at 120/72 with HR 83. She reports that she is currently taking lisinopril 40 mg daily. She adamantly refuses that she has hypertension and became angry when I suggested that she did. Asymptomatic today.  A/P: Continue lisinopril 40 mg daily

## 2017-05-19 ENCOUNTER — Ambulatory Visit: Payer: Self-pay

## 2017-05-19 MED ORDER — INSULIN DEGLUDEC 100 UNIT/ML ~~LOC~~ SOPN: 15.0000 [IU] | PEN_INJECTOR | Freq: Every day | SUBCUTANEOUS | 11 refills | Status: DC

## 2017-05-19 NOTE — Assessment & Plan Note (Addendum)
Currently smoking 1/2 PPD. Not interested in quitting at this time.   Encouraged cessation.

## 2017-05-19 NOTE — Assessment & Plan Note (Addendum)
Patient is amenable to re-starting atorvastatin for high intensity statin therapy given her ASCVD risk of 20%. She is enrolled in the [place] [facility]ication assistance program and reports she can get the medication.  Consider repeat fasting lipid panel in the future given her elevated triglycerides.   Start atorvastatin 80 mg daily D/C pravastatin

## 2017-05-25 NOTE — Progress Notes (Signed)
Internal Medicine Clinic Attending  Case discussed with Dr. Boswell  at the time of the visit.  We reviewed the resident's history and exam and pertinent patient test results.  I agree with the assessment, diagnosis, and plan of care documented in the resident's note.  Alexander N Raines, MD   

## 2017-05-28 ENCOUNTER — Other Ambulatory Visit: Payer: Self-pay | Admitting: Family Medicine

## 2017-05-28 ENCOUNTER — Encounter: Payer: Self-pay | Admitting: Dietician

## 2017-05-28 ENCOUNTER — Ambulatory Visit: Payer: Self-pay

## 2017-05-28 ENCOUNTER — Encounter: Payer: Self-pay | Admitting: Internal Medicine

## 2017-05-29 NOTE — Telephone Encounter (Signed)
LVM for pt to call office back to inform her that she will need to call her PCP which is Valentino NoseNathan Boswell at internal medicine for her refill. It appears that she established care there on the 05/14/17 of November referred there by Dr. Myrtie SomanWarden. Dr. Myrtie SomanWarden is no longer her PCP. Routing to Dr. Myrtie SomanWarden as an Lorain ChildesFYI and to new PCP as well.  Lamonte SakaiZimmerman Rumple, Siraj Dermody D, [place]

## 2017-05-29 NOTE — Telephone Encounter (Signed)
Patient has no showed to two appointments with us already. I will prescribe for 30 days but patient must come to appointments to continue to receive prescriptions. She has not scheduled follow up after he no show this week. Please schedule her for follow up in the next 2 weeks.

## 2017-05-29 NOTE — Telephone Encounter (Signed)
Pt is out of her medication .  She is requesting for a rush on it because she cannot be without it all weekend.  [address] [address]

## 2017-06-11 ENCOUNTER — Encounter: Payer: Self-pay | Admitting: Pharmacist

## 2017-06-11 ENCOUNTER — Ambulatory Visit: Payer: Self-pay

## 2017-06-11 NOTE — Progress Notes (Signed)
Dawn from [facility] called to notify clinic that patient has not followed through on paperwork. She also has not picked up Guinea-Bissauresiba or Trulicity from pharmacy since process was started about a month ago. I tried calling patient but unable to reach, left message to call back.  Information only.

## 2017-06-25 ENCOUNTER — Telehealth: Payer: Self-pay

## 2017-06-25 ENCOUNTER — Telehealth: Payer: Self-pay | Admitting: Family Medicine

## 2017-06-25 ENCOUNTER — Other Ambulatory Visit: Payer: Self-pay | Admitting: *Deleted

## 2017-06-25 MED ORDER — GABAPENTIN 300 MG PO CAPS: 600.0000 mg | ORAL_CAPSULE | Freq: Three times a day (TID) | ORAL | 3 refills | Status: DC

## 2017-06-25 NOTE — Telephone Encounter (Signed)
Pt needs refill on gabapentin. Told pt Myrtie SomanWarden was not the dr that prescribed and told her the name of the dr who did and she said Myrtie SomanWarden does all her meds. Please advise

## 2017-06-25 NOTE — Telephone Encounter (Signed)
done

## 2017-06-25 NOTE — Telephone Encounter (Signed)
Contacted pt to inquire if she was indeed going to internal medicine now and she said she was and I informed her that I would send this request to her new PCP to take care of her refill. Lamonte SakaiZimmerman Rumple, April D, [place]

## 2017-06-25 NOTE — Telephone Encounter (Signed)
gabapentin (NEURONTIN) 300 MG capsule, ref[facility] request @ [facility] [address].  

## 2017-06-26 ENCOUNTER — Other Ambulatory Visit: Payer: Self-pay | Admitting: Internal Medicine

## 2017-06-26 MED ORDER — GABAPENTIN 300 MG PO CAPS: 600.0000 mg | ORAL_CAPSULE | Freq: Three times a day (TID) | ORAL | 2 refills | Status: DC

## 2017-06-26 NOTE — Telephone Encounter (Signed)
Rx for 90-day supply of gabapentin 600 TID sent to [facility].

## 2017-07-09 ENCOUNTER — Ambulatory Visit: Payer: Self-pay

## 2017-07-14 ENCOUNTER — Ambulatory Visit: Payer: Self-pay

## 2017-07-17 ENCOUNTER — Telehealth: Payer: Self-pay | Admitting: Internal Medicine

## 2017-07-17 NOTE — Telephone Encounter (Signed)
PT CALLED AND CANCELLED APPT TO [facility], SHE SAID SHE WAS GOING A DIFFERENT ROUTE FOR ASSISTANCE.

## 2017-07-21 ENCOUNTER — Ambulatory Visit: Payer: Self-pay

## 2017-08-26 ENCOUNTER — Encounter: Payer: Self-pay | Admitting: Internal Medicine

## 2017-10-07 ENCOUNTER — Other Ambulatory Visit: Payer: Self-pay | Admitting: *Deleted

## 2017-10-07 DIAGNOSIS — E1165 Type 2 diabetes mellitus with hyperglycemia: Principal | ICD-10-CM

## 2017-10-07 DIAGNOSIS — IMO0002 Reserved for concepts with insufficient information to code with codable children: Secondary | ICD-10-CM

## 2017-10-07 DIAGNOSIS — Z794 Long term (current) use of insulin: Principal | ICD-10-CM

## 2017-10-07 DIAGNOSIS — E1142 Type 2 diabetes mellitus with diabetic polyneuropathy: Secondary | ICD-10-CM

## 2017-10-07 MED ORDER — DULAGLUTIDE 0.75 MG/0.5ML ~~LOC~~ SOAJ: 0.7500 mg | SUBCUTANEOUS | 3 refills | Status: DC

## 2017-10-07 NOTE — Telephone Encounter (Signed)
Refilled sent. Message sent to front desk to call patient to schedule appt with me for regular check up as I have never met her and she has uncontrolled T2DM.

## 2017-10-27 ENCOUNTER — Telehealth: Payer: Self-pay | Admitting: *Deleted

## 2017-10-27 ENCOUNTER — Other Ambulatory Visit: Payer: Self-pay

## 2017-10-27 MED ORDER — GABAPENTIN 300 MG PO CAPS: 600.0000 mg | ORAL_CAPSULE | Freq: Three times a day (TID) | ORAL | 0 refills | Status: DC

## 2017-10-27 NOTE — Telephone Encounter (Signed)
gabapentin (NEURONTIN) 300 MG capsule, [facility]. Pt states she is out and requesting the med to be filled by today. Please call pt back.

## 2017-10-27 NOTE — Telephone Encounter (Signed)
I will send a 30 day supply of gabapentin. Please call patient and ask her to schedule an appt with me or at [facility] within the next 30 days. If not seen during this time, I will not order another refill as I have never evaluated the patient. Will need to send a warning letter as well due to the significant amount of cancellations and no shows.

## 2017-10-27 NOTE — Telephone Encounter (Signed)
rtc to pt per dr Lovenia Kim, offered an appt after informing pt that gabapentin would not be filled again with no appt. Pt states she does not have 25.00 and seeing debh. Is too much trouble to her, she states she does not want to get the paperwork together and could I suggest " a way around her paying 25.00" she states if I can fix it she would be very grateful. I stated no, she would need to go through financial services as everyone else. She states tell the doctor I said thank you and ill be there when I can.

## 2017-11-30 ENCOUNTER — Other Ambulatory Visit: Payer: Self-pay

## 2017-11-30 ENCOUNTER — Ambulatory Visit: Payer: Self-pay | Admitting: Internal Medicine

## 2017-11-30 VITALS — BP 123/83 | HR 78 | Temp 98.2°F | Ht 65.5 in | Wt 199.7 lb

## 2017-11-30 DIAGNOSIS — E559 Vitamin D deficiency, unspecified: Secondary | ICD-10-CM

## 2017-11-30 DIAGNOSIS — R748 Abnormal levels of other serum enzymes: Secondary | ICD-10-CM

## 2017-11-30 DIAGNOSIS — E785 Hyperlipidemia, unspecified: Secondary | ICD-10-CM

## 2017-11-30 DIAGNOSIS — E1165 Type 2 diabetes mellitus with hyperglycemia: Secondary | ICD-10-CM

## 2017-11-30 DIAGNOSIS — Z9114 Patient's other noncompliance with medication regimen: Secondary | ICD-10-CM

## 2017-11-30 DIAGNOSIS — E049 Nontoxic goiter, unspecified: Secondary | ICD-10-CM

## 2017-11-30 DIAGNOSIS — Z794 Long term (current) use of insulin: Secondary | ICD-10-CM

## 2017-11-30 DIAGNOSIS — I10 Essential (primary) hypertension: Secondary | ICD-10-CM

## 2017-11-30 DIAGNOSIS — E1142 Type 2 diabetes mellitus with diabetic polyneuropathy: Secondary | ICD-10-CM

## 2017-11-30 DIAGNOSIS — R718 Other abnormality of red blood cells: Secondary | ICD-10-CM

## 2017-11-30 DIAGNOSIS — IMO0002 Reserved for concepts with insufficient information to code with codable children: Secondary | ICD-10-CM

## 2017-11-30 DIAGNOSIS — Z79899 Other long term (current) drug therapy: Secondary | ICD-10-CM

## 2017-11-30 LAB — POCT GLYCOSYLATED HEMOGLOBIN (HGB A1C): Hemoglobin A1C: 13 % — AB (ref 4.0–5.6)

## 2017-11-30 LAB — GLUCOSE, CAPILLARY: Glucose-Capillary: 209 mg/dL — ABNORMAL HIGH (ref 65–99)

## 2017-11-30 MED ORDER — ATORVASTATIN CALCIUM 80 MG PO TABS: 80.0000 mg | ORAL_TABLET | Freq: Every day | ORAL | 2 refills | Status: DC

## 2017-11-30 MED ORDER — ASPIRIN 81 MG PO CHEW: 81.0000 mg | CHEWABLE_TABLET | Freq: Every day | ORAL | 2 refills | Status: DC

## 2017-11-30 MED ORDER — EMPAGLIFLOZIN 10 MG PO TABS: 10.0000 mg | ORAL_TABLET | Freq: Every day | ORAL | 2 refills | Status: DC

## 2017-11-30 MED ORDER — GABAPENTIN 300 MG PO CAPS: 600.0000 mg | ORAL_CAPSULE | Freq: Three times a day (TID) | ORAL | 2 refills | Status: DC

## 2017-11-30 MED ORDER — INSULIN DEGLUDEC 100 UNIT/ML ~~LOC~~ SOPN: 15.0000 [IU] | PEN_INJECTOR | Freq: Every day | SUBCUTANEOUS | 11 refills | Status: DC

## 2017-11-30 MED ORDER — LISINOPRIL 40 MG PO TABS: 40.0000 mg | ORAL_TABLET | Freq: Every day | ORAL | 2 refills | Status: DC

## 2017-11-30 NOTE — Assessment & Plan Note (Addendum)
Patient with grossly enlarged thyroid on exam.  No nodules or masses palpated.  She denies any symptoms including no heat or cold intolerance, fatigue, headaches, palpitations, appetite changes, shortness of breath.  She reports it is not changed in size.  However, she remains concerned and requests to have her thyroid checked today.  TSH was within normal limits in August 2018.   We will check thyroid levels at patient request.  Prior CT from 2012 recommended thyroid ultrasound for further evaluation.  This does not appear to have been done.  Will need follow-up with PCP in the next 3 months and consideration of pursuing thyroid ultrasound.

## 2017-11-30 NOTE — Assessment & Plan Note (Addendum)
Lab Results  Component Value Date   HGBA1C 12.4 02/23/2017   HGBA1C 11.9 02/14/2016   HGBA1C 11.8 10/25/2015    Unfortunately, [name] has not followed up with us on her diabetes since her initial visit on 05/14/2017.  She is currently taking Tresiba 15 units daily and Jardiance 10 mg daily.  Previously been prescribed Trulicity 0.75 mg weekly but she reports that she never started taking the medication.  Her A1c at time of initial establishing care was 12.4.  Today it is 13.0.  She denies any low blood sugars and reports only checking her blood sugars at home periodically.  She did not bring her meter with her today.  A/P: Discussed the importance of improving control of her diabetes.  She is very resistant indications though I encouraged her to start the Trulicity and pointed out and can help with weight loss.  Discussed the importance of diet and exercise.  Will refer to diabetic education and nutrition counseling.  Kristen Garrett did meet with her briefly today to provide her information on the group meetings.  Encouraged her to follow-up closely for diabetes however patient reports that she will not have insurance until at least 90 days when her husband's insurance his new job kicks in.  Follow-up with PCP in 3 months or sooner if able.

## 2017-11-30 NOTE — Assessment & Plan Note (Addendum)
Discussed initiation of atorvastatin 80 mg daily given her high risk based on her last lipid panel.  She reports that she has been taking atorvastatin since that visit however reports she misses doses quite frequently.  She notes that she misses doses more often than she takes it.  Will not check a cholesterol panel today given patient's very sporadic adherence.  Even if cholesterol remains elevated would not make any changes today.  Encourage patient to take her medication daily.  She is able to have consistent intake of her Lipitor we will recheck a cholesterol panel to assess for response in 6 to 8 weeks.

## 2017-11-30 NOTE — Progress Notes (Signed)
   CC: Diabetes follow-up  HPI:  [name] is a 57 y.o. female with a past medical history listed below here today for follow up of her diabetes.  For details of today's visit and the status of her chronic medical issues please refer to the assessment and plan.   Past Medical History:  Diagnosis Date  . Diabetes mellitus   . DVT of lower limb, acute (HCC) Age 57   Went on blood thinner for a few months  . Hyperlipidemia   . Hyperlipidemia   . Vertigo    Review of Systems:   No chest pain or shortness of breath  Physical Exam:  Vitals:   11/30/17 0948  BP: 123/83  Pulse: 78  Temp: 98.2 F (36.8 C)  TempSrc: Oral  SpO2: 100%  Weight: 199 lb 11.2 oz (90.6 kg)  Height: 5' 5.5" (1.664 m)   GENERAL- alert, co-operative, appears as stated age, not in any distress. HEENT- Atraumatic, normocephalic, grossly enlarged thyroid without any nodules appreciated and no tenderness CARDIAC- RRR, no murmurs, rubs or gallops. RESP- Moving equal volumes of air, and clear to auscultation bilaterally, no wheezes or crackles. EXTREMITIES- pulse 2+, symmetric, no pedal edema. SKIN- Warm, dry, No rash or lesion. PSYCH- Normal mood and affect, appropriate thought content and speech.   Assessment & Plan:   See Encounters Tab for problem based charting.  Patient discussed with Dr. Rogelia BogaButcher

## 2017-11-30 NOTE — Assessment & Plan Note (Signed)
Patient requesting refill of her gabapentin today.  She takes gabapentin 600 mg 3 times daily.  Will refill today.

## 2017-11-30 NOTE — Assessment & Plan Note (Signed)
Noted to have isolated elevated alk phos at last check.  Likely related to vitamin D deficiency.  Will check vitamin D level today.

## 2017-11-30 NOTE — Progress Notes (Signed)
Internal Medicine Clinic Attending  Case discussed with Dr. Boswell at the time of the visit.  We reviewed the resident's history and exam and pertinent patient test results.  I agree with the assessment, diagnosis, and plan of care documented in the resident's note.  

## 2017-11-30 NOTE — Patient Instructions (Addendum)
Kristen Garrett,  It was a pleasure seeing you again today.  I am glad that you are doing well.  We need to work on control of your diabetes.  Your A1c is 13.  Her goal is to get to less than 7.  Please continue to work on diet and exercise and weight loss.  This is the best thing she can do to help control her diabetes.  I will go ahead and put in a referral to our diabetic educator and nutritionist.  She also has group appointments available if you prefer that.  Please schedule an appointment with Dr. Evelene CroonSantos in the next 3 months for follow-up.

## 2017-11-30 NOTE — Assessment & Plan Note (Signed)
MCV noted to be 78 previous check without any anemia.  Will check a ferritin today to assess for iron deficiency.

## 2017-11-30 NOTE — Assessment & Plan Note (Signed)
BP Readings from Last 3 Encounters:  11/30/17 123/83  05/14/17 120/72  02/23/17 130/76    Lab Results  Component Value Date   NA 139 02/23/2017   K 4.7 02/23/2017   CREATININE 0.86 02/23/2017   Patient currently taking lisinopril 40 mg daily.  Again she denies having had a diagnosis of high blood pressure.  A/P Continue lisinopril 40 mg daily.  We will check a BMP today.

## 2017-12-01 LAB — BMP8+ANION GAP
ANION GAP: 16 mmol/L (ref 10.0–18.0)
BUN/Creatinine Ratio: 18 (ref 9–23)
BUN: 17 mg/dL (ref 6–24)
CO2: 18 mmol/L — ABNORMAL LOW (ref 20–29)
CREATININE: 0.95 mg/dL (ref 0.57–1.00)
Calcium: 9.5 mg/dL (ref 8.7–10.2)
Chloride: 103 mmol/L (ref 96–106)
GFR calc Af Amer: 78 mL/min/{1.73_m2} (ref >59)
GFR, EST NON AFRICAN AMERICAN: 68 mL/min/{1.73_m2} (ref >59)
Glucose: 211 mg/dL — ABNORMAL HIGH (ref 65–99)
Potassium: 5.3 mmol/L — ABNORMAL HIGH (ref 3.5–5.2)
Sodium: 137 mmol/L (ref 134–144)

## 2017-12-01 LAB — LIPID PANEL
CHOLESTEROL TOTAL: 228 mg/dL — AB (ref 100–199)
Chol/HDL Ratio: 8.1 ratio — ABNORMAL HIGH (ref 0.0–4.4)
HDL: 28 mg/dL — AB (ref >39)
Triglycerides: 687 mg/dL (ref 0–149)

## 2017-12-01 LAB — VITAMIN D 25 HYDROXY (VIT D DEFICIENCY, FRACTURES): Vit D, 25-Hydroxy: 12.9 ng/mL — ABNORMAL LOW (ref 30.0–100.0)

## 2017-12-01 LAB — TSH: TSH: 1.43 u[IU]/mL (ref 0.450–4.500)

## 2017-12-01 LAB — FERRITIN: FERRITIN: 192 ng/mL — AB (ref 15–150)

## 2017-12-04 ENCOUNTER — Encounter: Payer: Self-pay | Admitting: Internal Medicine

## 2017-12-17 ENCOUNTER — Encounter: Payer: Self-pay | Admitting: Dietician

## 2017-12-17 ENCOUNTER — Ambulatory Visit: Payer: Medicaid Other | Admitting: Dietician

## 2017-12-17 DIAGNOSIS — E1165 Type 2 diabetes mellitus with hyperglycemia: Secondary | ICD-10-CM

## 2017-12-17 NOTE — Patient Instructions (Signed)
It was nice to have you join our group today Ms.Ashraf.  Please feel free to bring your [facility].   I am happy to answer any questions or concerns you have at any time.   Please know we support you and your goals to try to be healthier!  Lupita LeashDonna  Plyler [phone]

## 2017-12-17 NOTE — Progress Notes (Signed)
Diabetes Self Management Training & Support Group meeting: Stat time:1015 am End time:1115 am  Patient attended diabetes education group visit today for 60 minutes. The program included successes and challenges over the past few weeks. We discussed healthy food choices including portions sizes, reading labels, why fiber is important, meal planning using the pate method. Our activity was picking out the "healhty" foods we would buy from a grocery store flyer.   [name] states her main goal she is working on currently is quitting smoking.  Follow up: 1 month for Diabetes group meeting. Norm Parcelonna Plyler, RD 12/17/2017 4:27 PM.

## 2018-01-14 ENCOUNTER — Ambulatory Visit: Payer: Self-pay | Admitting: Dietician

## 2018-03-08 ENCOUNTER — Other Ambulatory Visit: Payer: Self-pay

## 2018-03-08 DIAGNOSIS — E1142 Type 2 diabetes mellitus with diabetic polyneuropathy: Secondary | ICD-10-CM

## 2018-03-08 DIAGNOSIS — E1165 Type 2 diabetes mellitus with hyperglycemia: Principal | ICD-10-CM

## 2018-03-08 DIAGNOSIS — Z794 Long term (current) use of insulin: Principal | ICD-10-CM

## 2018-03-08 DIAGNOSIS — I10 Essential (primary) hypertension: Secondary | ICD-10-CM

## 2018-03-08 DIAGNOSIS — IMO0002 Reserved for concepts with insufficient information to code with codable children: Secondary | ICD-10-CM

## 2018-03-08 NOTE — Telephone Encounter (Signed)
#   90 with 2 refills sent 11/30/2017 to [facility]. Attempted to reach patient to discuss request. No answer. Recording states, "Call cannot be completed at this time." No ability to leave VM. Kinnie Feil, RN, BSN

## 2018-03-08 NOTE — Telephone Encounter (Signed)
lisinopril (PRINIVIL,ZESTRIL) 40 MG tablet, REFILL REQUEST @   [address] [place], [place] - [address] [phone] (Phone) [phone] (Fax)

## 2018-03-11 ENCOUNTER — Ambulatory Visit: Payer: Self-pay

## 2018-03-11 ENCOUNTER — Ambulatory Visit: Payer: Self-pay | Admitting: Pharmacist

## 2018-03-11 MED ORDER — LISINOPRIL 40 MG PO TABS: 40.0000 mg | ORAL_TABLET | Freq: Every day | ORAL | 2 refills | Status: DC

## 2018-03-11 NOTE — Telephone Encounter (Signed)
Patient returned call, [facility] and [facility] [address]. Patient is completely out of med. HD pharm removed from patient record. Kinnie FeilL. Vania Rosero, RN, BSN

## 2018-04-02 ENCOUNTER — Ambulatory Visit: Payer: Self-pay

## 2018-04-02 ENCOUNTER — Encounter: Payer: Self-pay | Admitting: Internal Medicine

## 2018-05-10 ENCOUNTER — Ambulatory Visit: Payer: Self-pay

## 2018-05-11 ENCOUNTER — Other Ambulatory Visit (HOSPITAL_COMMUNITY)
Admission: RE | Admit: 2018-05-11 | Discharge: 2018-05-11 | Disposition: A | Discharge status: Home / Self Care | Payer: PRIVATE HEALTH INSURANCE | Source: Ambulatory Visit | Attending: Internal Medicine | Admitting: Internal Medicine

## 2018-05-11 ENCOUNTER — Ambulatory Visit (INDEPENDENT_AMBULATORY_CARE_PROVIDER_SITE_OTHER): Payer: Self-pay | Admitting: Internal Medicine

## 2018-05-11 ENCOUNTER — Encounter: Payer: Self-pay | Admitting: Internal Medicine

## 2018-05-11 ENCOUNTER — Other Ambulatory Visit: Payer: Self-pay

## 2018-05-11 VITALS — BP 153/79 | HR 78 | Temp 98.4°F | Ht 66.0 in | Wt 194.2 lb

## 2018-05-11 DIAGNOSIS — E1142 Type 2 diabetes mellitus with diabetic polyneuropathy: Secondary | ICD-10-CM

## 2018-05-11 DIAGNOSIS — I10 Essential (primary) hypertension: Secondary | ICD-10-CM

## 2018-05-11 DIAGNOSIS — Z794 Long term (current) use of insulin: Secondary | ICD-10-CM

## 2018-05-11 DIAGNOSIS — E785 Hyperlipidemia, unspecified: Secondary | ICD-10-CM

## 2018-05-11 DIAGNOSIS — E1165 Type 2 diabetes mellitus with hyperglycemia: Secondary | ICD-10-CM

## 2018-05-11 DIAGNOSIS — F17210 Nicotine dependence, cigarettes, uncomplicated: Secondary | ICD-10-CM

## 2018-05-11 DIAGNOSIS — Z0000 Encounter for general adult medical examination without abnormal findings: Secondary | ICD-10-CM | POA: Diagnosis present

## 2018-05-11 DIAGNOSIS — R197 Diarrhea, unspecified: Secondary | ICD-10-CM

## 2018-05-11 DIAGNOSIS — E010 Iodine-deficiency related diffuse (endemic) goiter: Secondary | ICD-10-CM

## 2018-05-11 DIAGNOSIS — Z79899 Other long term (current) drug therapy: Secondary | ICD-10-CM

## 2018-05-11 DIAGNOSIS — E118 Type 2 diabetes mellitus with unspecified complications: Secondary | ICD-10-CM

## 2018-05-11 MED ORDER — FREESTYLE LIBRE 14 DAY SENSOR MISC: 1.0000 | Freq: Every day | 12 refills | Status: DC

## 2018-05-11 MED ORDER — EMPAGLIFLOZIN 10 MG PO TABS: 10.0000 mg | ORAL_TABLET | Freq: Every day | ORAL | 2 refills | Status: DC

## 2018-05-11 MED ORDER — INSULIN DEGLUDEC-LIRAGLUTIDE 100-3.6 UNIT-MG/ML ~~LOC~~ SOPN: 16.0000 [IU] | PEN_INJECTOR | Freq: Every day | SUBCUTANEOUS | 0 refills | Status: DC

## 2018-05-11 MED ORDER — FREESTYLE LIBRE 14 DAY READER DEVI: 1.0000 | Freq: Every day | 0 refills | Status: DC

## 2018-05-11 MED ORDER — GABAPENTIN 300 MG PO CAPS: 600.0000 mg | ORAL_CAPSULE | Freq: Three times a day (TID) | ORAL | 2 refills | Status: DC

## 2018-05-11 MED ORDER — ATORVASTATIN CALCIUM 80 MG PO TABS: 80.0000 mg | ORAL_TABLET | Freq: Every day | ORAL | 2 refills | Status: DC

## 2018-05-11 MED ORDER — LISINOPRIL 40 MG PO TABS: 40.0000 mg | ORAL_TABLET | Freq: Every day | ORAL | 2 refills | Status: DC

## 2018-05-11 NOTE — Patient Instructions (Addendum)
Thank you for allowing [place] to provide your care today. Today we discussed your diabetes, blood pressure, and healthcare maintenance issues    I have ordered some labs for you. I will call if any are abnormal.    Today we made the following changes to your medications.  Please continue your Jardiance. We can start the Xultophy, take 16 units once a day. Stop the Guinea-Biss[place] and do not take the trulicity.  Please check your blood sugars at least once a day so that we know how much we need to adjust your medications on your next visit. Please continue to work on your diet and exercise.   Please try to attend the diabetes classes that we offer.    Please keep your feet elevated and wear compression stockings at night. This in addition to the jardiance should help with your leg swelling.  You can try taking Imodium for your diarrhea.    We have placed a referral for the colonoscopy and mammogram.   Please follow-up in 1-2 weeks for your diabetes.    Should you have any questions or concerns please call the internal medicine clinic at [phone].

## 2018-05-11 NOTE — Progress Notes (Signed)
CC: Follow up of diabetes and pap smear  HPI:  [name] is a 57 y.o.  with a PMH listed below including HTN, DM type 2, HLD presenting today for follow up of her diabetes and a pap smear.  In regards to her diabetes she is prescribed Trulicity 0.75 mg weekly, Jardiance 10 mg daily, and tresiba 15 units daily. She reports that she has never started the trulicity and stopped taking the jardiance and the [place] 2 weeks ago. She reports fustration due to her diabetes, states that she does not like pricking herself and that she feels like even when she was on the medications that they were not helping bring her sugars down. She reports that she does not check her blood sugars he does not they are doing. She denies any symptoms of hypoglycemia. Her last A1c was in 6/19 and it was 13.  Had a long discussion about diabetes and why she was on multiple medications.  We offered her a new medication, Tery Sanfilippo, which will only be 1 shot daily.  She reported that she would be interested in doing this.  She reported that she all try to check her blood sugars daily for the next 2 weeks and she can follow-up at that time to reassess her diabetes.   She was interested in getting the [facility], she does not like pricking her fingers and wants a different way for her blood sugars to be checked.  Patient is not up to date on her pap smears.  Last pap smear in 2014 was negative for intraepithelial lesions of malignancy. Will obtain pap smear today.   Her blood pressure today is 153/79, elevated above her goal of 130/80. She is currently on lisinopril 40 mg daily. She had been on HCTZ in the past and is requesting to have this restarted. She reports that she has developed lower leg swelling. She reports that she feels like this is because she is not on the HCTZ however she noted that her legs were less swollen when she was taking the jardiace. She reported that she will restart the jardiance and see if that  helps with the swelling.   Please see A&P for status of the patient's chronic medical conditions  Past Medical History:  Diagnosis Date  . Diabetes mellitus   . DVT of lower limb, acute (HCC) Age 38   Went on blood thinner for a few months  . Hyperlipidemia   . Hyperlipidemia   . Vertigo    Review of Systems: Refer to history of present illness and assessment and plans for pertinent review of systems, all others reviewed and negative.  Physical Exam:  Vitals:   05/11/18 1420  BP: (!) 153/79  Pulse: 78  Temp: 98.4 F (36.9 C)  TempSrc: Oral  SpO2: 99%  Weight: 194 lb 3.2 oz (88.1 kg)  Height: 5\' 6"  (1.676 m)    Physical Exam  Constitutional: She is oriented to person, place, and time and well-developed, well-nourished, and in no distress.  HENT:  Head: Normocephalic and atraumatic.  Eyes: Pupils are equal, round, and reactive to light. Conjunctivae and EOM are normal.  Neck: Normal range of motion. Neck supple. Thyromegaly present.  Cardiovascular: Normal rate, regular rhythm and normal heart sounds. Exam reveals no gallop and no friction rub.  No murmur heard. Pulmonary/Chest: Effort normal and breath sounds normal. No respiratory distress. She has no wheezes.  Abdominal: Soft. Bowel sounds are normal. She exhibits no distension.  Musculoskeletal: Normal  range of motion. She exhibits edema (trace pitting bilateral edema).  Neurological: She is alert and oriented to person, place, and time. Gait normal.  Skin: Skin is warm and dry. No erythema.  Psychiatric: Mood and affect normal.    Social History   Socioeconomic History  . Marital status: Married    Spouse name: Not on file  . Number of children: Not on file  . Years of education: Not on file  . Highest education level: Not on file  Occupational History  . Occupation: Unemployed   Social Needs  . Financial resource strain: Not on file  . Food insecurity:    Worry: Not on file    Inability: Not on file  .  Transportation needs:    Medical: Not on file    Non-medical: Not on file  Tobacco Use  . Smoking status: Current Every Day Smoker    Packs/day: 0.50    Years: 40.00    Pack years: 20.00    Types: Cigarettes  . Smokeless tobacco: Never Used  Substance and Sexual Activity  . Alcohol use: No    Alcohol/week: 0.0 standard drinks  . Drug use: No  . Sexual activity: Yes    Comment: tubal--- 1 sexual partner married  Lifestyle  . Physical activity:    Days per week: Not on file    Minutes per session: Not on file  . Stress: Not on file  Relationships  . Social connections:    Talks on phone: Not on file    Gets together: Not on file    Attends religious service: Not on file    Active member of club or organization: Not on file    Attends meetings of clubs or organizations: Not on file    Relationship status: Not on file  . Intimate partner violence:    Fear of current or ex partner: Not on file    Emotionally abused: Not on file    Physically abused: Not on file    Forced sexual activity: Not on file  Other Topics Concern  . 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    2 grown daughters.        Family History  Problem Relation Age of Onset  . Diabetes Mother   . Hypertension Mother   . Hyperlipidemia Mother   . Diabetes Father   . Hyperlipidemia Father   . Diabetes Brother     Assessment & Plan:   See Encounters Tab for problem based charting.  Patient seen with Dr. Cleda DaubE. Hoffman

## 2018-05-12 DIAGNOSIS — R197 Diarrhea, unspecified: Secondary | ICD-10-CM | POA: Insufficient documentation

## 2018-05-12 DIAGNOSIS — E1165 Type 2 diabetes mellitus with hyperglycemia: Secondary | ICD-10-CM

## 2018-05-12 DIAGNOSIS — IMO0002 Reserved for concepts with insufficient information to code with codable children: Secondary | ICD-10-CM | POA: Insufficient documentation

## 2018-05-12 DIAGNOSIS — E1142 Type 2 diabetes mellitus with diabetic polyneuropathy: Secondary | ICD-10-CM | POA: Insufficient documentation

## 2018-05-12 DIAGNOSIS — Z794 Long term (current) use of insulin: Secondary | ICD-10-CM

## 2018-05-12 MED ORDER — EMPAGLIFLOZIN 10 MG PO TABS: 10.0000 mg | ORAL_TABLET | Freq: Every day | ORAL | 2 refills | Status: DC

## 2018-05-12 MED ORDER — LISINOPRIL 40 MG PO TABS: 40.0000 mg | ORAL_TABLET | Freq: Every day | ORAL | 2 refills | Status: DC

## 2018-05-12 NOTE — Assessment & Plan Note (Signed)
In regards to her diabetes she is prescribed Trulicity 0.75 mg weekly, Jardiance 10 mg daily, and tresiba 15 units daily. She reports that she has never started the trulicity and stopped taking the jardiance and the Guinea-Bissauresiba 2 weeks ago. She reports fustration due to her diabetes, states that she does not like pricking herself and that she feels like even when she was on the medications that they were not helping bring her sugars down. She reports that she does not check her blood sugars he does not they are doing. She denies any symptoms of hypoglycemia. Her last A1c was in 6/19 and it was 13.  Had a long discussion about diabetes and why she was on multiple medications.  We offered her a new medication, Tery SanfilippoXutolphy, which will only be 1 shot daily.  She reported that she would be interested in doing this.  She reported that she all try to check her blood sugars daily for the next 2 weeks and she can follow-up at that time to reassess her diabetes.   She was [facility] the [facility], she does not like pricking her fingers and wants a different way for her blood sugars to be checked.  Plan:  -She does not want to continue Trulicity 0.75 mg weekly, discontinue this and tresiba -Continue Jardiance  -Start Xultophy 16 units daily -Referral for eye exam -Prescription for [employer] libre -Plan to get a hemoglobin A1c but patient left before collecting this, check A1c on next visit

## 2018-05-12 NOTE — Assessment & Plan Note (Signed)
Patient reports that she has been having diarrhea for a few months now. She started having diarrhea when she was on metformin. This was stopped due to the side effects and she was started on insulin. She reported that she is still having episodes of diarrhea however it has improved. She thinks it is related to the insulin that she is taking. She reports that it does not matter what she eats however it often occurs when she does not eat much during the day. She was very interested in seeing a gastroenterologist to have this evaluated. On exam she does not have any abdominal pain and her vitals are normal.  Plan: -Recommended for patient to eat a regular diet and to monitor the foods that she is eating when she has diarrhea -Gastroenterology referral for colonoscopy and evaluation of this -BMP today

## 2018-05-12 NOTE — Assessment & Plan Note (Signed)
Cholesterol 228, Triglycerides 687, HDL 28, Chol/HDL ratio 8.1. Triglycerides elevated. Currently on Atorvastatin 80 mg daily, will refill this. Discuss triglyceride lowering medications on next visit.

## 2018-05-12 NOTE — Assessment & Plan Note (Signed)
Patient is not up to date on her pap smears.  Last pap smear in 2014 was negative for intraepithelial lesions of malignancy. Will obtain pap smear today.

## 2018-05-12 NOTE — Assessment & Plan Note (Signed)
  Her blood pressure today is 153/79, elevated above her goal of 130/80. She is currently on lisinopril 40 mg daily. She had been on HCTZ in the past and is requesting to have this restarted. She reports that she has developed lower leg swelling. She reports that she feels like this is because she is not on the HCTZ however she noted that her legs were less swollen when she was taking the jardiace. She reported that she will restart the jardiance and see if that helps with the swelling.   Plan: -Continue Lisinopril 40 mg daily

## 2018-05-12 NOTE — Assessment & Plan Note (Signed)
She is using gabapentin which she reports is working well. Refill this today.

## 2018-05-13 LAB — CYTOLOGY - PAP
Adequacy: ABSENT
Diagnosis: NEGATIVE

## 2018-05-14 ENCOUNTER — Other Ambulatory Visit: Payer: Self-pay

## 2018-05-14 DIAGNOSIS — E1165 Type 2 diabetes mellitus with hyperglycemia: Secondary | ICD-10-CM

## 2018-05-14 MED ORDER — EMPAGLIFLOZIN 10 MG PO TABS: 10.0000 mg | ORAL_TABLET | Freq: Every day | ORAL | 2 refills | Status: DC

## 2018-05-14 NOTE — Telephone Encounter (Signed)
empagliflozin (JARDIANCE) 10 MG TABS tablet, refill request [address]@  [facility] [place] - [address] [phone] (Phone) [phone])

## 2018-05-17 NOTE — Progress Notes (Signed)
Internal Medicine Clinic Attending  I saw and evaluated the patient.  I personally confirmed the key portions of the history and exam documented by Dr. Gwyneth RevelsKrienke and I reviewed pertinent patient test results.  The assessment, diagnosis, and plan were formulated together and I agree with the documentation in the resident's note.    Patient does not like the trulicity pen, her DM is currently uncontrolled, I discussed medications in detail with her, she has been more adherent to tresebia than other insulins, in this case I think Xultophy is the best option as it will combine long actiing insulin and GLP-1a benefits.  She was agreeable with this plan.

## 2018-09-12 ENCOUNTER — Other Ambulatory Visit: Payer: Self-pay | Admitting: Internal Medicine

## 2018-09-12 DIAGNOSIS — E1142 Type 2 diabetes mellitus with diabetic polyneuropathy: Secondary | ICD-10-CM

## 2018-09-12 DIAGNOSIS — I10 Essential (primary) hypertension: Secondary | ICD-10-CM

## 2018-09-14 MED ORDER — LISINOPRIL 40 MG PO TABS: 40.0000 mg | ORAL_TABLET | Freq: Every day | ORAL | 0 refills | Status: DC

## 2018-09-14 MED ORDER — GABAPENTIN 300 MG PO CAPS: 600.0000 mg | ORAL_CAPSULE | Freq: Three times a day (TID) | ORAL | 1 refills | Status: DC

## 2018-09-14 NOTE — Telephone Encounter (Signed)
gabapentin (NEURONTIN) 300 MG capsule    lisinopril (PRINIVIL,ZESTRIL) 40 MG tablet   Refill request @  [address] - [place], [place] - [address] [phone] (Phone) [phone] (Fax   Pt states her feet is hurting, requesting the med to be filled by today. Please call the pt back.

## 2018-09-14 NOTE — Addendum Note (Signed)
Addended by: Claudean Severance on: 09/14/2018 02:07 PM   Modules accepted: Orders

## 2018-09-16 ENCOUNTER — Ambulatory Visit: Payer: Self-pay

## 2019-01-11 NOTE — Progress Notes (Signed)
CC: Diabetes, hypertension, HLD and healthcare maintenance   HPI:  [name] is a 58 y.o.  with a PMH listed below presenting for her diabetes, hypertension, HLD and healthcare maintenance.   Please see A&P for status of the patient's chronic medical conditions  Past Medical History:  Diagnosis Date  . Diabetes mellitus   . DVT of lower limb, acute ([place]) Age 24   Went on blood thinner for a few months  . Hyperlipidemia   . Hyperlipidemia   . Vertigo    Review of Systems: Refer to history of present illness and assessment and plans for pertinent review of systems, all others reviewed and negative.  Physical Exam:  Vitals:   01/12/19 1600 01/12/19 1641  BP: (!) 172/82 (!) 180/86  Pulse: 86 86  Temp: 99.2 F (37.3 C)   TempSrc: Oral   SpO2: 100%   Weight: 194 lb 4.8 oz (88.1 kg)    Physical Exam  Constitutional: She is well-developed, well-nourished, and in no distress.  HENT:  Head: Normocephalic and atraumatic.  Eyes: Pupils are equal, round, and reactive to light. Conjunctivae and EOM are normal.  Cardiovascular: Normal rate, regular rhythm and normal heart sounds.  Pulmonary/Chest: Effort normal and breath sounds normal. No respiratory distress.  Abdominal: Soft. Bowel sounds are normal. She exhibits no distension.  Skin: Skin is warm and dry. No erythema.  Psychiatric: Mood and affect normal.    Social History   Socioeconomic History  . Marital status: Married    Spouse name: Not on file  . Number of children: Not on file  . Years of education: Not on file  . Highest education level: Not on file  Occupational History  . Occupation: Unemployed   Social Needs  . Financial resource strain: Not on file  . Food insecurity    Worry: Not on file    Inability: Not on file  . Transportation needs    Medical: Not on file    Non-medical: Not on file  Tobacco Use  . Smoking status: Current Every Day Smoker    Packs/day: 0.50    Years: 40.00    Pack  years: 20.00    Types: Cigarettes  . Smokeless tobacco: Never Used  Substance and Sexual Activity  . Alcohol use: No    Alcohol/week: 0.0 standard drinks  . Drug use: No  . Sexual activity: Yes  Lifestyle  . Physical activity    Days per week: Not on file    Minutes per session: Not on file  . Stress: Not on file  Relationships  . Social Herbalist on phone: Not on file    Gets together: Not on file    Attends religious service: Not on file    Active member of club or organization: Not on file    Attends meetings of clubs or organizations: Not on file    Relationship status: Not on file  . Intimate partner violence    Fear of current or ex partner: Not on file    Emotionally abused: Not on file    Physically abused: Not on file    Forced sexual activity: Not on file  Other Topics Concern  . 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 Lives in [place] with her husband.    2 grown daughters.        Family History  Problem Relation Age of Onset  . Diabetes Mother   .  Hypertension Mother   . Hyperlipidemia Mother   . Diabetes Father   . Hyperlipidemia Father   . Diabetes Brother     Assessment & Plan:   See Encounters Tab for problem based charting.  Patient discussed with Dr. Heide SparkNarendra

## 2019-01-12 ENCOUNTER — Other Ambulatory Visit: Payer: Self-pay

## 2019-01-12 ENCOUNTER — Other Ambulatory Visit: Payer: Self-pay | Admitting: Dietician

## 2019-01-12 ENCOUNTER — Encounter: Payer: Self-pay | Admitting: Internal Medicine

## 2019-01-12 ENCOUNTER — Ambulatory Visit (INDEPENDENT_AMBULATORY_CARE_PROVIDER_SITE_OTHER): Payer: BLUE CROSS/BLUE SHIELD | Admitting: Internal Medicine

## 2019-01-12 VITALS — BP 180/86 | HR 86 | Temp 99.2°F | Wt 194.3 lb

## 2019-01-12 DIAGNOSIS — E559 Vitamin D deficiency, unspecified: Secondary | ICD-10-CM | POA: Diagnosis not present

## 2019-01-12 DIAGNOSIS — I10 Essential (primary) hypertension: Secondary | ICD-10-CM

## 2019-01-12 DIAGNOSIS — E785 Hyperlipidemia, unspecified: Secondary | ICD-10-CM

## 2019-01-12 DIAGNOSIS — Z794 Long term (current) use of insulin: Secondary | ICD-10-CM

## 2019-01-12 DIAGNOSIS — E509 Vitamin A deficiency, unspecified: Secondary | ICD-10-CM | POA: Diagnosis not present

## 2019-01-12 DIAGNOSIS — E1165 Type 2 diabetes mellitus with hyperglycemia: Secondary | ICD-10-CM

## 2019-01-12 DIAGNOSIS — E1142 Type 2 diabetes mellitus with diabetic polyneuropathy: Secondary | ICD-10-CM

## 2019-01-12 DIAGNOSIS — E118 Type 2 diabetes mellitus with unspecified complications: Secondary | ICD-10-CM | POA: Diagnosis not present

## 2019-01-12 DIAGNOSIS — Z79899 Other long term (current) drug therapy: Secondary | ICD-10-CM

## 2019-01-12 DIAGNOSIS — E049 Nontoxic goiter, unspecified: Secondary | ICD-10-CM | POA: Diagnosis not present

## 2019-01-12 DIAGNOSIS — Z0000 Encounter for general adult medical examination without abnormal findings: Secondary | ICD-10-CM

## 2019-01-12 DIAGNOSIS — F17210 Nicotine dependence, cigarettes, uncomplicated: Secondary | ICD-10-CM

## 2019-01-12 DIAGNOSIS — IMO0002 Reserved for concepts with insufficient information to code with codable children: Secondary | ICD-10-CM

## 2019-01-12 LAB — POCT GLYCOSYLATED HEMOGLOBIN (HGB A1C): HbA1c POC (<> result, manual entry): >14 % — AB (ref 4.0–5.6)

## 2019-01-12 LAB — GLUCOSE, CAPILLARY: Glucose-Capillary: 275 mg/dL — ABNORMAL HIGH (ref 70–99)

## 2019-01-12 MED ORDER — XULTOPHY 100-3.6 UNIT-MG/ML ~~LOC~~ SOPN: 16.0000 [IU] | PEN_INJECTOR | Freq: Every day | SUBCUTANEOUS | 0 refills | Status: DC

## 2019-01-12 MED ORDER — HYDROCHLOROTHIAZIDE 25 MG PO TABS: 25.0000 mg | ORAL_TABLET | Freq: Every day | ORAL | 2 refills | Status: DC

## 2019-01-12 MED ORDER — EMPAGLIFLOZIN 10 MG PO TABS: 10.0000 mg | ORAL_TABLET | Freq: Every day | ORAL | 2 refills | Status: DC

## 2019-01-12 NOTE — Telephone Encounter (Signed)
Called [facility] about freestyle El[place]or [place]- it was on hold- when the pharmacist put it through it asked for a PA for step therapy. However, she says she got a message that Dexcom is preferred. Request rx for Dexcom.

## 2019-01-12 NOTE — Patient Instructions (Signed)
[name],  It was a pleasure to see you today. Thank you for coming in.   Today we discussed your diabetes. In regards to this please start taking Xultophy 16 units daily and Jardiance 10 mg daily.   We also discussed your blood pressure. Please continue taking the lisinopril 40 mg daily and start taking HCTZ 25 mg daily. If your blood pressure is still elevated on your next visit we will increase these medications. If you start having the recurrence of the cough please contact us and let us know, we can change your lisinopril at that time.   We have checked some labs, I will contact you with the results. We may have to start you on some medications at that time.    Please return to clinic in 3 months or sooner if needed.   Thank you again for coming in.   [name].D.

## 2019-01-13 LAB — BMP8+ANION GAP
Anion Gap: 13 mmol/L (ref 10.0–18.0)
BUN/Creatinine Ratio: 16 (ref 9–23)
BUN: 12 mg/dL (ref 6–24)
CO2: 19 mmol/L — ABNORMAL LOW (ref 20–29)
Calcium: 9.4 mg/dL (ref 8.7–10.2)
Chloride: 102 mmol/L (ref 96–106)
Creatinine, Ser: 0.73 mg/dL (ref 0.57–1.00)
GFR calc Af Amer: 106 mL/min/{1.73_m2} (ref >59)
GFR calc non Af Amer: 92 mL/min/{1.73_m2} (ref >59)
Glucose: 291 mg/dL — ABNORMAL HIGH (ref 65–99)
Potassium: 4.1 mmol/L (ref 3.5–5.2)
Sodium: 134 mmol/L (ref 134–144)

## 2019-01-13 LAB — LIPID PANEL
Chol/HDL Ratio: 8 ratio — ABNORMAL HIGH (ref 0.0–4.4)
Cholesterol, Total: 239 mg/dL — ABNORMAL HIGH (ref 100–199)
HDL: 30 mg/dL — ABNORMAL LOW (ref >39)
Triglycerides: 657 mg/dL (ref 0–149)

## 2019-01-13 LAB — VITAMIN D 25 HYDROXY (VIT D DEFICIENCY, FRACTURES): Vit D, 25-Hydroxy: 10.5 ng/mL — ABNORMAL LOW (ref 30.0–100.0)

## 2019-01-14 ENCOUNTER — Telehealth: Payer: Self-pay | Admitting: Internal Medicine

## 2019-01-14 ENCOUNTER — Other Ambulatory Visit: Payer: Self-pay | Admitting: *Deleted

## 2019-01-14 DIAGNOSIS — E559 Vitamin D deficiency, unspecified: Secondary | ICD-10-CM | POA: Insufficient documentation

## 2019-01-14 LAB — SPECIMEN STATUS REPORT

## 2019-01-14 LAB — TSH: TSH: 1.09 u[IU]/mL (ref 0.450–4.500)

## 2019-01-14 MED ORDER — TRESIBA 100 UNIT/ML ~~LOC~~ SOLN: 16.0000 [IU] | Freq: Every day | SUBCUTANEOUS | 0 refills | Status: DC

## 2019-01-14 MED ORDER — VITAMIN D 25 MCG (1000 UNIT) PO TABS: 1000.0000 [IU] | ORAL_TABLET | Freq: Every day | ORAL | 1 refills | Status: DC

## 2019-01-14 MED ORDER — DEXCOM G6 TRANSMITTER MISC: 1.0000 | Freq: Every day | 4 refills | Status: DC

## 2019-01-14 MED ORDER — ATORVASTATIN CALCIUM 80 MG PO TABS: 80.0000 mg | ORAL_TABLET | Freq: Every day | ORAL | 2 refills | Status: DC

## 2019-01-14 MED ORDER — DEXCOM G6 RECEIVER DEVI: 1.0000 | Freq: Every day | 0 refills | Status: DC

## 2019-01-14 MED ORDER — DEXCOM G6 SENSOR MISC: 1.0000 | Freq: Every day | 12 refills | Status: DC

## 2019-01-14 NOTE — Assessment & Plan Note (Signed)
Order mammogram and colonoscopy referral

## 2019-01-14 NOTE — Assessment & Plan Note (Signed)
Patient had previously had a decreased vitamin D level at 12.9, we discussed starting vitamin D supplementation however patient preferred to recheck her labs and consider starting at that time.    -Check vitamin D level  >> Vitamin D 10.5.  Contacted patient and informed her to start taking vitamin D 1000 units daily, sent in prescription but informed her that if insurance did not pay for it she can pick this up over-the-counter

## 2019-01-14 NOTE — Assessment & Plan Note (Addendum)
Patient supposed to be on Jardiance 10 mg and Xultophy 16 units daily, however she states that the past few months she has been infrequently using insulin due to being out of her prescriptions and her medications.  She does have some freestyle libre but states that her insurance is not able to pay for that anymore she also states that she does not like checking her blood sugars due to not liking to prick her fingers.  She does endorsed polyuria, polydipsia, nocturia, states that she has a variable appetite.  Her last A1c was elevated to 13.  She also endorses some mild leg numbness and tingling, is currently on gabapentin which she reports helped.  Plan: -Gave prescription for Xultophy 16 units daily, however patient contacted [place] in insurance will not pay for that, advised patient to continue Tresiba 16 units daily for now  -Continue Jardiance 10 units daily -Gave prescription for Dexcon G6 -Checking BMP and A1c -RTC in 1 month

## 2019-01-14 NOTE — Telephone Encounter (Signed)
Return call to pt - stated her insurance does not cover the combo pill  (Xultophy/Jardiance) and wants to know if she can stay on Tresiba until her next appt which is 8/17? Also stated her insurance does cover Dexcom G6 - she's requesting a rx to be sent to her pharmacy. Thanks

## 2019-01-14 NOTE — Assessment & Plan Note (Signed)
No significantly enlarged thyroid noted on exam, no nodules, masses, or tenderness.  She denied any symptoms of hypo-or hyperthyroidism including cold or heat intolerance, fatigue, headaches, palpitations, or difficulty swallowing.  She is concerned about her thyroid levels and is requesting to have her thyroid levels checked.  Previous levels each year have been WNL.  -Check TSH

## 2019-01-14 NOTE — Telephone Encounter (Signed)
Pt would also like a prescription for a machine to check her sugar

## 2019-01-14 NOTE — Telephone Encounter (Signed)
Pt is requesting nurse to call about changing her insulin; [phone]

## 2019-01-14 NOTE — Telephone Encounter (Signed)
Hello,  I returned her call and sent in the prescriptions.  Thank you

## 2019-01-14 NOTE — Assessment & Plan Note (Addendum)
Patient is currently taking lisinopril 40 mg daily, she denies any issues taking her medications, she does state that she had been having a cough and that last week she had stopped taking the lisinopril for a few days to see if that would help, she states that it did help a little bit but she recently restarted taking the lisinopril about 2 days ago and her symptoms have not returned.  Pressure today is elevated to 172/82, repeat is 180/86.  She states that she does not think she has high blood pressure and feels that that is mostly related to her stress and agitation.  She also has been having lower extremity swelling and feet swelling, she is requesting to be on hydrochlorothiazide for her swelling.  Discussed that the hydrochlorothiazide will also help with her blood pressure however patient states that she does not have high blood pressure, she feels that she is only on lisinopril for kidney protection.  I believe the patient does have hypertension and will need to have further discussion about this in the future.  Plan: -Continue lisinopril 40 mg daily -Start HCTZ 25 mg daily

## 2019-01-14 NOTE — Assessment & Plan Note (Addendum)
Patient is not on any statin at this time, even though atorvastatin 80 mg daily medication list.  Her last lipid panel was elevated and we discussed restarting atorvastatin at this time.  However patient preferred to have her lipids rechecked before starting this.  -Check lipid panel  >>Lipid panel: Cholesterol 239, Triglycerides 657, LDL unable to calculate due to triglyceride level.  -Advised patient to restart atorvastatin 80 mg daily, sent in new prescription

## 2019-01-17 ENCOUNTER — Telehealth: Payer: Self-pay | Admitting: Internal Medicine

## 2019-01-17 ENCOUNTER — Telehealth: Payer: Self-pay | Admitting: *Deleted

## 2019-01-17 DIAGNOSIS — E1165 Type 2 diabetes mellitus with hyperglycemia: Secondary | ICD-10-CM

## 2019-01-17 NOTE — Telephone Encounter (Signed)
Needs refill on push pin diabetics, an older prescription was sent to the pharmacy pt also needs needles head   ;pt contact [place], [place]

## 2019-01-17 NOTE — Telephone Encounter (Signed)
Information gathered for [employer] and [employer].  Patient will need to try and fail Byetta or  Bydureon.  Patient has tried Victoza.  Sander Nephew, RN 01/17/2019 3:54 PM.

## 2019-01-17 NOTE — Telephone Encounter (Signed)
Pt wants a new script sent for tresiba pens and [facility], she states she has not taken any all weekend and her cbg's are 300 to 400. She wants this done ASAP

## 2019-01-18 ENCOUNTER — Other Ambulatory Visit: Payer: Self-pay | Admitting: Internal Medicine

## 2019-01-18 MED ORDER — TRESIBA 100 UNIT/ML ~~LOC~~ SOLN: 16.0000 [IU] | Freq: Every day | SUBCUTANEOUS | 0 refills | Status: DC

## 2019-01-18 MED ORDER — NOVOFINE PLUS 32G X 4 MM MISC: 1.0000 | Freq: Every day | 99 refills | Status: DC

## 2019-01-18 NOTE — Progress Notes (Signed)
Internal Medicine Clinic Attending  Case discussed with Dr. Krienke at the time of the visit.  We reviewed the resident's history and exam and pertinent patient test results.  I agree with the assessment, diagnosis, and plan of care documented in the resident's note.    

## 2019-01-18 NOTE — Telephone Encounter (Addendum)
Fax received from [facility] stating patient prefers [place] pens. Please resend, pack size 15 mL along with pen needles. Hubbard Hartshorn, RN, BSN

## 2019-01-18 NOTE — Telephone Encounter (Signed)
Will leave for Dr. Sherry Ruffing.

## 2019-01-19 NOTE — Telephone Encounter (Signed)
I sent in those prescriptions, thank you.

## 2019-01-26 ENCOUNTER — Encounter: Payer: PRIVATE HEALTH INSURANCE | Admitting: Internal Medicine

## 2019-01-28 NOTE — Telephone Encounter (Signed)
Okay, thank you. She is currently on Tresiba so I will talk to her about starting exanetide on her next visit.

## 2019-02-11 NOTE — Progress Notes (Signed)
CC: Hypertension, diabetes, hyperlipidemia, and neuropathy  HPI:  [name] is a 58 y.o.  with a PMH listed below presenting for hypertension, diabetes, hyperlipidemia, and neuropathy.  She reports that overall she is doing well is feeling a lot better after starting the vitamin D3.    Please see A&P for status of the patient's chronic medical conditions  Past Medical History:  Diagnosis Date  . Diabetes mellitus   . DVT of lower limb, acute ([place]) Age 39   Went on blood thinner for a few months  . Hyperlipidemia   . Hyperlipidemia   . Vertigo    Review of Systems: Refer to history of present illness and assessment and plans for pertinent review of systems, all others reviewed and negative.  Physical Exam:  Vitals:   02/14/19 0911 02/14/19 1003  BP: (!) 163/89 140/74  Pulse: 99 80  Temp: 98.4 F (36.9 C)   TempSrc: Oral   SpO2: 100%   Weight: 190 lb 6.4 oz (86.4 kg)    Physical Exam  Constitutional: She is oriented to person, place, and time and well-developed, well-nourished, and in no distress.  HENT:  Head: Normocephalic and atraumatic.  Cardiovascular: Normal rate, regular rhythm and normal heart sounds.  Pulmonary/Chest: Breath sounds normal. No respiratory distress.  Abdominal: Soft. Bowel sounds are normal.  Musculoskeletal: Normal range of motion.        General: No edema.  Neurological: She is alert and oriented to person, place, and time.  Skin: Skin is warm and dry.  Psychiatric: Mood and affect normal.    Social History   Socioeconomic History  . Marital status: Married    Spouse name: Not on file  . Number of children: Not on file  . Years of education: Not on file  . Highest education level: Not on file  Occupational History  . Occupation: Unemployed   Social Needs  . Financial resource strain: Not on file  . Food insecurity    Worry: Not on file    Inability: Not on file  . Transportation needs    Medical: Not on file   Non-medical: Not on file  Tobacco Use  . Smoking status: Current Every Day Smoker    Packs/day: 0.50    Years: 40.00    Pack years: 20.00    Types: Cigarettes  . Smokeless tobacco: Never Used  Substance and Sexual Activity  . Alcohol use: No    Alcohol/week: 0.0 standard drinks  . Drug use: No  . Sexual activity: Yes  Lifestyle  . Physical activity    Days per week: Not on file    Minutes per session: Not on file  . Stress: Not on file  Relationships  . Social Herbalist on phone: Not on file    Gets together: Not on file    Attends religious service: Not on file    Active member of club or organization: Not on file    Attends meetings of clubs or organizations: Not on file    Relationship status: Not on file  . Intimate partner violence    Fear of current or ex partner: Not on file    Emotionally abused: Not on file    Physically abused: Not on file    Forced sexual activity: Not on file  Other Topics Concern  . 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  2 grown daughters.        Family History  Problem Relation Age of Onset  . Diabetes Mother   . Hypertension Mother   . Hyperlipidemia Mother   . Diabetes Father   . Hyperlipidemia Father   . Diabetes Brother     Assessment & Plan:   See Encounters Tab for problem based charting.  Patient discussed with Dr. Heide SparkNarendra

## 2019-02-14 ENCOUNTER — Other Ambulatory Visit: Payer: Self-pay

## 2019-02-14 ENCOUNTER — Ambulatory Visit (INDEPENDENT_AMBULATORY_CARE_PROVIDER_SITE_OTHER): Payer: BLUE CROSS/BLUE SHIELD | Admitting: Internal Medicine

## 2019-02-14 ENCOUNTER — Telehealth: Payer: Self-pay | Admitting: Dietician

## 2019-02-14 ENCOUNTER — Encounter: Payer: Self-pay | Admitting: Internal Medicine

## 2019-02-14 VITALS — BP 140/74 | HR 80 | Temp 98.4°F | Wt 190.4 lb

## 2019-02-14 DIAGNOSIS — E1142 Type 2 diabetes mellitus with diabetic polyneuropathy: Secondary | ICD-10-CM | POA: Diagnosis not present

## 2019-02-14 DIAGNOSIS — E785 Hyperlipidemia, unspecified: Secondary | ICD-10-CM

## 2019-02-14 DIAGNOSIS — F17210 Nicotine dependence, cigarettes, uncomplicated: Secondary | ICD-10-CM

## 2019-02-14 DIAGNOSIS — Z794 Long term (current) use of insulin: Secondary | ICD-10-CM

## 2019-02-14 DIAGNOSIS — Z79899 Other long term (current) drug therapy: Secondary | ICD-10-CM

## 2019-02-14 DIAGNOSIS — I10 Essential (primary) hypertension: Secondary | ICD-10-CM | POA: Diagnosis not present

## 2019-02-14 DIAGNOSIS — E1165 Type 2 diabetes mellitus with hyperglycemia: Secondary | ICD-10-CM

## 2019-02-14 DIAGNOSIS — Z0000 Encounter for general adult medical examination without abnormal findings: Secondary | ICD-10-CM

## 2019-02-14 MED ORDER — TRESIBA FLEXTOUCH 100 UNIT/ML ~~LOC~~ SOPN: 16.0000 [IU] | PEN_INJECTOR | Freq: Every day | SUBCUTANEOUS | 1 refills | Status: DC

## 2019-02-14 MED ORDER — NOVOFINE PLUS 32G X 4 MM MISC: 99 refills | Status: DC

## 2019-02-14 MED ORDER — ATORVASTATIN CALCIUM 80 MG PO TABS: 80.0000 mg | ORAL_TABLET | Freq: Every day | ORAL | 2 refills | Status: DC

## 2019-02-14 MED ORDER — LISINOPRIL-HYDROCHLOROTHIAZIDE 20-12.5 MG PO TABS: 2.0000 | ORAL_TABLET | Freq: Every day | ORAL | 1 refills | Status: DC

## 2019-02-14 NOTE — Patient Instructions (Signed)
[name],  It was a pleasure to see you today. Thank you for coming in.   Today we discussed your diabetes, it's doing better but your blood sugars are still elevated. We should have close follow up to make sure that they continue to decrease. Please work on decreasing your sugar intake, work on [employer] intake.  Please continue taking Tresiba 16 units daily, start taking this in the morning. Continue taking Jardiance daily.   We also discussed your blood pressure. It was a little high today, please restart your hydrochlorothiazide.   Please return to clinic in 1 month or sooner if needed.   Thank you again for coming in.   [name].D.

## 2019-02-14 NOTE — Telephone Encounter (Signed)
Calling [facility] about the Dexcom G6 CGM prescriptions: Transmitter- $18.51 Receiver- $ 28.18  Sensor-$61.81 All went through without any problem. Will be ready to pick up in an hour or so. Patient called and notified. She asked appropriate questions about the [facility] 2 (now with alarms and increased accuracy) vs the Dexcom G6  CGMs. She was educated about the [employer] 2 and offered a sample sensor. I suggested she set up an appointment to learn how to use the Dexcom G6 and also watch videos online.

## 2019-02-14 NOTE — Assessment & Plan Note (Addendum)
Patient is currently on lisinopril 40 mg daily has been taking this with no issues. She had been prescribed hydrochlorothiazide 25 mg daily on her last visit however she thought that this is more leg swelling and since her swelling had gone down she stopped taking hydrochlorothiazide.  We discussed that her blood pressures are still elevated during this visit and that she should resume her hydrochlorothiazide.  We discussed switching her from lisinopril and hydrochlorothiazide to the combo pill lisinopril-hydrochlorothiazide and she was interested in this. Blood pressure today is elevated.   BP Readings from Last 3 Encounters:  02/14/19 140/74  01/12/19 (!) 180/86  05/11/18 (!) 153/79     Plan: -Discontinue lisinopril and hydrochlorothiazide -Start lisinopril-hydrochlorothiazide 40-25 mg daily - Return to clinic in 1 month to evaluate

## 2019-02-14 NOTE — Assessment & Plan Note (Addendum)
Patient is currently on Jardiance 10 mg daily and Tresiba 16 units daily, she had been given a prescription for Xultophy  16 units daily however patient's insurance would not cover this.  She does endorse some polyuria and polydipsia, she states that her neck.  Has improved and feels like she is been to the bathroom less at night.  Reports that her appetite has been good and her neuropathy is well controlled with gabapentin.  She currently has the [place] to monitor her blood sugars and she states that her blood sugars have mostly been less than 200 however it is typically around 205 to 216.  She states that she has been taking the [place] at lunchtime since that is when her highest blood sugars are, we discussed that she should take it earlier in the morning so that it will be in full effect in the afternoon.  We discussed that since her CBGs are still elevated we would preferably like to go up on her insulin, however patient was hesitant to go up on Tresiba since she had been slowly increasing the [place] and only been taking the 16 units daily for the past week.  She reported that she wanted to work on her diet.  She reported that she would find an eye doctor on her own and did not want a referral.   -Continue Jardiance 10 mg daily -Continue Tresiba 16 units daily for now (would prefer to go up however patient is hesitant to increase this)

## 2019-02-15 ENCOUNTER — Telehealth: Payer: Self-pay | Admitting: *Deleted

## 2019-02-15 DIAGNOSIS — E1165 Type 2 diabetes mellitus with hyperglycemia: Secondary | ICD-10-CM

## 2019-02-15 NOTE — Telephone Encounter (Signed)
Notes from pharm: 1) need new script for tresiba- 42ml w/refills not 75ml  2) insurance will not pay for brand on lipitor, is there a reason brand name was specified? If not please send new script, if there is please send to [facility] rn for PA

## 2019-02-15 NOTE — Assessment & Plan Note (Signed)
She continues to be on gabapentin 600 mg TID, she reports that she is doing well with this regimen.

## 2019-02-15 NOTE — Assessment & Plan Note (Signed)
Colonoscopy is in the process of being scheduled. She declined an HIV and Hep C test today.

## 2019-02-17 MED ORDER — TRESIBA FLEXTOUCH 100 UNIT/ML ~~LOC~~ SOPN: 16.0000 [IU] | PEN_INJECTOR | Freq: Every day | SUBCUTANEOUS | 1 refills | Status: DC

## 2019-02-17 NOTE — Progress Notes (Signed)
Internal Medicine Clinic Attending  Case discussed with Dr. Krienke at the time of the visit.  We reviewed the resident's history and exam and pertinent patient test results.  I agree with the assessment, diagnosis, and plan of care documented in the resident's note.    

## 2019-02-21 ENCOUNTER — Telehealth: Payer: Self-pay | Admitting: *Deleted

## 2019-02-21 NOTE — Telephone Encounter (Addendum)
Pa information sent through CoverMyMeds for Lipitor.  Patient unable to tolerate the Astrovastatin.  Awaiting determination within 3 business days.  Sander Nephew, RN 02/21/2019 11:27 AM.  Call to [facility] for PA for Lipitor past denial.  Information was given at peer to peer with pharmacist.  Call to patient to get further information about intolerance to Lipitor.  Information was relayed to Pharmacist. Approval was given.  To await fax for  confirmation. Sander Nephew, RN 02/25/2019 1:57 PM. Fax with Approval for Lipitor 02/21/2019 thru 02/19/2022.   Sander Nephew, RN 03/01/2019 2:43 PM.

## 2019-02-22 ENCOUNTER — Other Ambulatory Visit: Payer: Self-pay | Admitting: Dietician

## 2019-02-22 DIAGNOSIS — E1165 Type 2 diabetes mellitus with hyperglycemia: Secondary | ICD-10-CM

## 2019-02-22 NOTE — Telephone Encounter (Signed)
Kristen Garrett calls saying she wants to go back to using the Freestyle Libre 14 day sensors because she can use them with an App on her cell phone. She says her insurance will cover the sensors for the [employer] 14 day CGM if someone from her doctors' office call them. She is calling to ask someone from our office to call her insurance company per their request(? Prior approval?)   She does not want to use the Dexcom or Freestyle Libre 2 CGMs, she does not think she needs alarms.  Will request a prescription for the freestyle liber 14 day sensors.

## 2019-02-23 MED ORDER — FREESTYLE LIBRE 14 DAY SENSOR MISC: 1.0000 | Freq: Four times a day (QID) | 12 refills | Status: DC

## 2019-02-23 NOTE — Telephone Encounter (Signed)
Rec'd call from [facility].   Pt's  Lipitor has been denied. Please call [phone] if any questions.

## 2019-02-24 ENCOUNTER — Telehealth: Payer: Self-pay | Admitting: *Deleted

## 2019-02-24 NOTE — Telephone Encounter (Signed)
Called pharmacy, they ran the freestyle libre 14 day, it is not covered by her insurer.  A PA is required, insurance prefers Dexcom. The Dexcom CGM was put on hold.

## 2019-02-24 NOTE — Telephone Encounter (Signed)
Call to [facility] for PA for FreeStyle Libre 14 Day Sensor.  Will have to go through CoverMyMeds first.  Information was sent through cover my meds .  Awaiting decision from [facility].  Sander Nephew, RN 02/24/2019 2:00 PM.

## 2019-02-26 ENCOUNTER — Other Ambulatory Visit: Payer: Self-pay | Admitting: Internal Medicine

## 2019-02-26 DIAGNOSIS — E1142 Type 2 diabetes mellitus with diabetic polyneuropathy: Secondary | ICD-10-CM

## 2019-02-28 NOTE — Telephone Encounter (Signed)
Notified Ms. Ranes that her [employer] sensors were approved by [employer]. She also requested a [employer] account to upload her CGM remotely. This was done while she was on the phone to confirm she got it.

## 2019-03-05 ENCOUNTER — Encounter (HOSPITAL_COMMUNITY): Payer: Self-pay

## 2019-03-05 ENCOUNTER — Other Ambulatory Visit: Payer: Self-pay

## 2019-03-05 ENCOUNTER — Ambulatory Visit (HOSPITAL_COMMUNITY)
Admission: EM | Admit: 2019-03-05 | Discharge: 2019-03-05 | Disposition: A | Discharge status: Home / Self Care | Payer: BLUE CROSS/BLUE SHIELD | Attending: Family Medicine | Admitting: Family Medicine

## 2019-03-05 DIAGNOSIS — R030 Elevated blood-pressure reading, without diagnosis of hypertension: Secondary | ICD-10-CM | POA: Diagnosis not present

## 2019-03-05 DIAGNOSIS — R1010 Upper abdominal pain, unspecified: Secondary | ICD-10-CM | POA: Diagnosis not present

## 2019-03-05 MED ORDER — ONDANSETRON HCL 4 MG PO TABS: 4.0000 mg | ORAL_TABLET | Freq: Three times a day (TID) | ORAL | 0 refills | Status: DC | PRN

## 2019-03-05 MED ORDER — POLYETHYLENE GLYCOL 3350 17 G PO PACK: 17.0000 g | PACK | Freq: Two times a day (BID) | ORAL | 0 refills | Status: DC

## 2019-03-05 MED ORDER — ONDANSETRON 4 MG PO TBDP
4.0000 mg | ORAL_TABLET | Freq: Once | ORAL | Status: AC
  Administered 2019-03-05: 4 mg via ORAL

## 2019-03-05 MED ORDER — ONDANSETRON 4 MG PO TBDP
ORAL_TABLET | ORAL | Status: AC
  Filled 2019-03-05: qty 1

## 2019-03-05 MED ORDER — DICYCLOMINE HCL 10 MG PO CAPS: ORAL_CAPSULE | ORAL | 0 refills | Status: DC

## 2019-03-05 NOTE — Discharge Instructions (Addendum)
Try MiraLAX 1-2 times daily over the next 3-4 days. If no improvement, try using an enema. Stay well hydrated and keep lots of fiber in your diet.  Keep pushing fluids.  Keep taking your blood pressure medication at home. Your elevation today could be due to heaving/pain.

## 2019-03-05 NOTE — ED Provider Notes (Signed)
  [place]    CSN: [id number] Arrival date & time: 03/05/19  1703   Chief Complaint  Patient presents with  . Abdominal Pain   Subjective Kristen Garrett is a 58 y.o. female who presents with vomiting and abd pain Symptoms began 5 d ago Patient has abdominal pain, vomiting and nausea; having some distension epigastric region that got better with a large BM yesterday, had not had one for the past 4 d prior Patient denies diarrhea, fever, arthralgias, myalgias and URI symptoms Tx tried: Fiber Sick contacts: daughter has similar s/s's  Past Medical History:  Diagnosis Date  . Diabetes mellitus   . DVT of lower limb, acute ([place]) Age 55   Went on blood thinner for a few months  . Hyperlipidemia   . Hyperlipidemia   . Vertigo    Past Surgical History:  Procedure Laterality Date  . ENDOMETRIAL ABLATION W/ NOVASURE    . TUBAL LIGATION     Allergies  Allergen Reactions  . Glipizide Diarrhea  . Metformin And Related Diarrhea    Review of Systems Constitutional:  No fevers or chills Gastrointestinal:  As noted in the HPI  Exam BP (!) 160/78 (BP Location: Right Arm)   Pulse 88   Temp 99.2 F (37.3 C) (Oral)   Resp 16   LMP 02/17/2015   SpO2 99%  General:  well developed, well hydrated, in no apparent distress Skin:  warm, no pallor or diaphoresis, no rashes Throat/Pharynx:  lips and gingiva without lesion; tongue and uvula midline; non-inflamed pharynx; no exudates or postnasal drainage Neck: neck supple without adenopathy, thyromegaly, or masses Lungs:  clear to auscultation, breath sounds equal bilaterally, no respiratory distress, no wheezes Cardio:  regular rate and rhythm without murmurs Abdomen:  abdomen soft, ttp in upper quadrants b/l; bowel sounds normal; no masses or organomegaly Extremities:  no clubbing, cyanosis, or edema Psych: Age appropriate judgment and insight   Final Clinical Impressions(s) / UC Diagnoses   Final diagnoses:  Pain of  upper abdomen   Could be infectious but when things improved after BM, I would like her to tx for that. Her BP is very high. Recheck better, she was just heaving prior to having it rechecked and still in pain. F/u w pcp to ensure normalization.    Discharge Instructions     Try MiraLAX 1-2 times daily over the next 3-4 days. If no improvement, try using an enema. Stay well hydrated and keep lots of fiber in your diet.  Keep pushing fluids.    ED Prescriptions    Medication Sig Dispense Auth. Provider   dicyclomine (BENTYL) 10 MG capsule Take 1 tab every 6 hours as needed for abdominal cramping. 60 capsule Shelda Pal, DO   ondansetron (ZOFRAN) 4 MG tablet Take 1 tablet (4 mg total) by mouth every 8 (eight) hours as needed for nausea or vomiting. 20 tablet Shelda Pal, DO   polyethylene glycol (MIRALAX / GLYCOLAX) 17 g packet Take 17 g by mouth 2 (two) times daily. [place], [place], DO       Riki Sheer [place], [place] 03/05/19 Evalee Jefferson

## 2019-03-05 NOTE — ED Notes (Signed)
Pt actively vomiting.

## 2019-03-05 NOTE — ED Triage Notes (Signed)
Pt present abdominal pain with some vomiting. Symptoms started Monday for stomach pain and vomiting started today.  Pt tried otc medication with no relief.

## 2019-03-07 ENCOUNTER — Inpatient Hospital Stay (HOSPITAL_COMMUNITY)
Admission: EM | Admit: 2019-03-07 | Discharge: 2019-03-10 | DRG: 074 | Disposition: A | Discharge status: Home / Self Care | Payer: BLUE CROSS/BLUE SHIELD | Attending: Internal Medicine | Admitting: Internal Medicine

## 2019-03-07 ENCOUNTER — Other Ambulatory Visit: Payer: Self-pay

## 2019-03-07 ENCOUNTER — Emergency Department (HOSPITAL_COMMUNITY): Payer: BLUE CROSS/BLUE SHIELD

## 2019-03-07 ENCOUNTER — Encounter (HOSPITAL_COMMUNITY): Payer: Self-pay

## 2019-03-07 DIAGNOSIS — Z8249 Family history of ischemic heart disease and other diseases of the circulatory system: Secondary | ICD-10-CM

## 2019-03-07 DIAGNOSIS — E119 Type 2 diabetes mellitus without complications: Secondary | ICD-10-CM | POA: Diagnosis present

## 2019-03-07 DIAGNOSIS — R748 Abnormal levels of other serum enzymes: Secondary | ICD-10-CM | POA: Diagnosis present

## 2019-03-07 DIAGNOSIS — Z7982 Long term (current) use of aspirin: Secondary | ICD-10-CM

## 2019-03-07 DIAGNOSIS — E1142 Type 2 diabetes mellitus with diabetic polyneuropathy: Secondary | ICD-10-CM | POA: Diagnosis present

## 2019-03-07 DIAGNOSIS — E785 Hyperlipidemia, unspecified: Secondary | ICD-10-CM | POA: Diagnosis present

## 2019-03-07 DIAGNOSIS — E559 Vitamin D deficiency, unspecified: Secondary | ICD-10-CM | POA: Diagnosis present

## 2019-03-07 DIAGNOSIS — R1010 Upper abdominal pain, unspecified: Secondary | ICD-10-CM | POA: Diagnosis not present

## 2019-03-07 DIAGNOSIS — Z86718 Personal history of other venous thrombosis and embolism: Secondary | ICD-10-CM

## 2019-03-07 DIAGNOSIS — K859 Acute pancreatitis without necrosis or infection, unspecified: Secondary | ICD-10-CM

## 2019-03-07 DIAGNOSIS — J302 Other seasonal allergic rhinitis: Secondary | ICD-10-CM | POA: Diagnosis present

## 2019-03-07 DIAGNOSIS — Z794 Long term (current) use of insulin: Secondary | ICD-10-CM

## 2019-03-07 DIAGNOSIS — Z833 Family history of diabetes mellitus: Secondary | ICD-10-CM

## 2019-03-07 DIAGNOSIS — K3184 Gastroparesis: Secondary | ICD-10-CM | POA: Diagnosis present

## 2019-03-07 DIAGNOSIS — E1143 Type 2 diabetes mellitus with diabetic autonomic (poly)neuropathy: Secondary | ICD-10-CM | POA: Diagnosis not present

## 2019-03-07 DIAGNOSIS — E1121 Type 2 diabetes mellitus with diabetic nephropathy: Secondary | ICD-10-CM | POA: Diagnosis present

## 2019-03-07 DIAGNOSIS — E1165 Type 2 diabetes mellitus with hyperglycemia: Secondary | ICD-10-CM | POA: Diagnosis present

## 2019-03-07 DIAGNOSIS — F17210 Nicotine dependence, cigarettes, uncomplicated: Secondary | ICD-10-CM | POA: Diagnosis present

## 2019-03-07 DIAGNOSIS — K5900 Constipation, unspecified: Secondary | ICD-10-CM | POA: Diagnosis present

## 2019-03-07 DIAGNOSIS — Z79899 Other long term (current) drug therapy: Secondary | ICD-10-CM

## 2019-03-07 DIAGNOSIS — I10 Essential (primary) hypertension: Secondary | ICD-10-CM | POA: Diagnosis present

## 2019-03-07 DIAGNOSIS — Z20828 Contact with and (suspected) exposure to other viral communicable diseases: Secondary | ICD-10-CM | POA: Diagnosis present

## 2019-03-07 LAB — COMPREHENSIVE METABOLIC PANEL WITH GFR
ALT: 30 U/L (ref 0–44)
AST: 21 U/L (ref 15–41)
Albumin: 3.5 g/dL (ref 3.5–5.0)
Alkaline Phosphatase: 140 U/L — ABNORMAL HIGH (ref 38–126)
Anion gap: 9 (ref 5–15)
BUN: 15 mg/dL (ref 6–20)
CO2: 23 mmol/L (ref 22–32)
Calcium: 9.4 mg/dL (ref 8.9–10.3)
Chloride: 103 mmol/L (ref 98–111)
Creatinine, Ser: 0.93 mg/dL (ref 0.44–1.00)
GFR calc Af Amer: >60 mL/min
GFR calc non Af Amer: >60 mL/min
Glucose, Bld: 280 mg/dL — ABNORMAL HIGH (ref 70–99)
Potassium: 4.3 mmol/L (ref 3.5–5.1)
Sodium: 135 mmol/L (ref 135–145)
Total Bilirubin: 0.4 mg/dL (ref 0.3–1.2)
Total Protein: 6.9 g/dL (ref 6.5–8.1)

## 2019-03-07 LAB — CBC
HCT: 42.4 % (ref 36.0–46.0)
Hemoglobin: 13.6 g/dL (ref 12.0–15.0)
MCH: 25.4 pg — ABNORMAL LOW (ref 26.0–34.0)
MCHC: 32.1 g/dL (ref 30.0–36.0)
MCV: 79.1 fL — ABNORMAL LOW (ref 80.0–100.0)
Platelets: 258 10*3/uL (ref 150–400)
RBC: 5.36 MIL/uL — ABNORMAL HIGH (ref 3.87–5.11)
RDW: 14 % (ref 11.5–15.5)
WBC: 11.7 10*3/uL — ABNORMAL HIGH (ref 4.0–10.5)
nRBC: 0 % (ref 0.0–0.2)

## 2019-03-07 LAB — I-STAT BETA HCG BLOOD, ED (MC, WL, AP ONLY): I-stat hCG, quantitative: <5 m[IU]/mL (ref <5)

## 2019-03-07 LAB — LIPASE, BLOOD: Lipase: 327 U/L — ABNORMAL HIGH (ref 11–51)

## 2019-03-07 MED ORDER — SODIUM CHLORIDE 0.9 % IV BOLUS
1000.0000 mL | Freq: Once | INTRAVENOUS | Status: AC
  Administered 2019-03-07: 1000 mL via INTRAVENOUS

## 2019-03-07 MED ORDER — MORPHINE SULFATE (PF) 4 MG/ML IV SOLN
4.0000 mg | Freq: Once | INTRAVENOUS | Status: AC
  Administered 2019-03-07: 4 mg via INTRAVENOUS
  Filled 2019-03-07: qty 1

## 2019-03-07 MED ORDER — ONDANSETRON HCL 4 MG/2ML IJ SOLN
4.0000 mg | Freq: Once | INTRAMUSCULAR | Status: AC
  Administered 2019-03-07: 23:00:00 4 mg via INTRAVENOUS
  Filled 2019-03-07: qty 2

## 2019-03-07 NOTE — ED Provider Notes (Signed)
[facility] EMERGENCY DEPARTMENT Provider Note   CSN: [id number] Arrival date & time: 03/07/19  2044     History   Chief Complaint Chief Complaint  Patient presents with   Abdominal Pain    HPI [name] is a 58 y.o. female.  She has a history of diabetes hyperlipidemia.  She is complaining of 1 week of upper abdominal pain right and left.  2 days ago it began to cause her to be nauseous and vomit.  No blood from above or below.  She went to urgent care and they treated her symptomatically with some medications that did not help.  She is complaining of 10 out of 10 pain here.  Does not radiate anywhere.  No prior history of same.  No fevers or chills no cough no shortness of breath no diarrhea or urinary symptoms.  She does not drink any alcohol.     The history is provided by the patient.  Abdominal Pain Pain location:  Epigastric Pain quality: aching   Pain radiates to:  Does not radiate Pain severity:  Severe Onset quality:  Gradual Duration:  1 week Timing:  Constant Progression:  Worsening Chronicity:  New Context: not alcohol use, not recent travel, not suspicious food intake and not trauma   Relieved by:  Nothing Worsened by:  Nothing Ineffective treatments:  OTC medications Associated symptoms: nausea and vomiting   Associated symptoms: no chest pain, no cough, no dysuria, no fever, no hematemesis, no hematochezia, no hematuria, no melena, no shortness of breath and no sore throat     Past Medical History:  Diagnosis Date   Diabetes mellitus    DVT of lower limb, acute ([facility]) Age 19   Went on blood thinner for a few months   Hyperlipidemia    Hyperlipidemia    Vertigo     Patient Active Problem List   Diagnosis Date Noted   Vitamin D deficiency 01/14/2019   Diarrhea 05/12/2018   Microcytosis 11/30/2017   Elevated alkaline phosphatase level 11/30/2017   'light-for-dates' infant with signs of fetal malnutrition 05/28/2017    Metatarsalgia of left foot 03/13/2016   Hyperlipidemia 07/20/2015   Edema of left lower extremity 09/18/2014   Xerosis of skin 07/13/2014   Onychomycosis 07/13/2014   Unspecified venous (peripheral) insufficiency 02/14/2013   Diabetic peripheral neuropathy ([place]) 11/03/2012   Left breast lump 06/01/2012   Enlarged thyroid 10/11/2011   Healthcare maintenance 10/11/2011   ALLERGIC RHINITIS, SEASONAL 09/07/2009   DM (diabetes mellitus), type 2, uncontrolled ([place]) 08/27/2006   TOBACCO DEPENDENCE 08/27/2006   HYPERTENSION, BENIGN SYSTEMIC 08/27/2006    Past Surgical History:  Procedure Laterality Date   ENDOMETRIAL ABLATION W/ NOVASURE     TUBAL LIGATION       OB History    Gravida  3   Para  2   Term  1   Preterm  1   AB      Living  2     SAB      TAB      Ectopic      Multiple      Live Births               Home Medications    Prior to Admission medications   Medication Sig Start Date End Date Taking? Authorizing Provider  aspirin 81 MG chewable tablet Chew 1 tablet (81 mg total) by mouth daily. 11/30/17   Maryellen Pile, MD  atorvastatin (LIPITOR) 80 MG tablet Take  1 tablet (80 mg total) by mouth daily. 02/14/19   [name], MD  Blood Glucose Monitoring Suppl ([place]) w/Device KIT 1 Device by Does not apply route daily. 08/02/15   Zenia Resides, MD  cetirizine (ZYRTEC) 10 MG tablet Take 1 tablet (10 mg total) by mouth daily. 02/23/17   Eloise Levels, MD  cholecalciferol (VITAMIN D3) 25 MCG (1000 UT) tablet Take 1 tablet (1,000 Units total) by mouth daily. 01/14/19   [name], MD  Continuous Blood Gluc Receiver ([place]) [place] 1 each by Does not apply route 6 (six) times daily. Dx code: E11.42; she is insulin requiring. 01/14/19   [name], MD  Continuous Blood Gluc Sensor (DEXCOM G6 SENSOR) MISC 1 each by Does not apply route 6 (six) times daily. Dx code: E11.42; she is insulin  requiring. 01/14/19   [name], MD  Continuous Blood Gluc Sensor (FREESTYLE LIBRE 14 DAY SENSOR) MISC 1 each by Does not apply route QID. 02/23/19   [name], MD  Continuous Blood Gluc Transmit (DEXCOM G6 TRANSMITTER) MISC 1 each by Does not apply route 6 (six) times daily. Dx code: E11.42; she is insulin requiring. 01/14/19   [name], MD  dicyclomine (BENTYL) 10 MG capsule Take 1 tab every 6 hours as needed for abdominal cramping. 03/05/19   Shelda Pal, DO  empagliflozin (JARDIANCE) 10 MG TABS tablet Take 10 mg by mouth daily. 01/12/19   [name], MD  gabapentin (NEURONTIN) 300 MG capsule TAKE 2 CAPSULES BY MOUTH THREE TIMES DAILY 02/28/19   [name], MD  glucose blood (RELION GLUCOSE TEST STRIPS) test strip Use as instructed 08/02/15   Zenia Resides, MD  insulin degludec (TRESIBA FLEXTOUCH) 100 UNIT/ML SOPN FlexTouch Pen Inject 0.16 mLs (16 Units total) into the skin daily. 02/17/19   [name], MD  Insulin Pen Needle (NOVOFINE PLUS) 32G X 4 MM MISC USE FOR DAILY INSULIN INJECTIONS 02/14/19   [name], MD  Lancets Misc. (RELION LANCING DEVICE) KIT 1 Device by Does not apply route daily. 08/02/15   Zenia Resides, MD  lisinopril-hydrochlorothiazide (ZESTORETIC) 20-12.5 MG tablet Take 2 tablets by mouth daily. 02/14/19   [name], MD  Multiple Vitamin (MULTIVITAMIN) capsule Take 1 capsule by mouth daily. Reported on 12/31/2015    [provider]  ondansetron (ZOFRAN) 4 MG tablet Take 1 tablet (4 mg total) by mouth every 8 (eight) hours as needed for nausea or vomiting. 03/05/19   Wendling, Crosby Oyster, DO  polyethylene glycol (MIRALAX / GLYCOLAX) 17 g packet Take 17 g by mouth 2 (two) times daily. 03/05/19   Shelda Pal, DO    Family History Family History  Problem Relation Age of Onset   Diabetes Mother    Hypertension Mother    Hyperlipidemia Mother    Diabetes Father    Hyperlipidemia  Father    Diabetes Brother     Social History Social History   Tobacco Use   Smoking status: Current Every Day Smoker    Packs/day: 0.50    Years: 40.00    Pack years: 20.00    Types: Cigarettes   Smokeless tobacco: Never Used  Substance Use Topics   Alcohol use: No    Alcohol/week: 0.0 standard drinks   Drug use: No     Allergies   Glipizide and Metformin and related   Review of Systems Review of Systems  Constitutional: Negative for fever.  HENT: Negative for sore throat.   Eyes: Negative for visual disturbance.  Respiratory: Negative for cough and shortness of breath.   Cardiovascular: Negative for chest pain.  Gastrointestinal: Positive for abdominal pain, nausea and vomiting. Negative for hematemesis, hematochezia and melena.  Genitourinary: Negative for dysuria and hematuria.  Musculoskeletal: Negative for back pain.  Skin: Negative for rash.  Neurological: Negative for headaches.     Physical Exam Updated Vital Signs BP 132/84    Pulse 96    Temp 98.9 F (37.2 C) (Oral)    Resp 19    Ht 5' 6.5" (1.689 m)    Wt 86.2 kg    LMP 02/17/2015    SpO2 99%    BMI 30.21 kg/m   Physical Exam Vitals signs and nursing note reviewed.  Constitutional:      General: She is not in acute distress.    Appearance: She is well-developed.  HENT:     Head: Normocephalic and atraumatic.  Eyes:     Conjunctiva/sclera: Conjunctivae normal.  Neck:     Musculoskeletal: Neck supple.  Cardiovascular:     Rate and Rhythm: Normal rate and regular rhythm.     Heart sounds: No murmur.  Pulmonary:     Effort: Pulmonary effort is normal. No respiratory distress.     Breath sounds: Normal breath sounds.  Abdominal:     Palpations: Abdomen is soft.     Tenderness: There is abdominal tenderness in the right upper quadrant, epigastric area and left upper quadrant.  Musculoskeletal: Normal range of motion.     Right lower leg: No edema.     Left lower leg: No edema.  Skin:     General: Skin is warm and dry.     Capillary Refill: Capillary refill takes less than 2 seconds.  Neurological:     General: No focal deficit present.     Mental Status: She is alert and oriented to person, place, and time.      ED Treatments / Results  Labs (all labs ordered are listed, but only abnormal results are displayed) Labs Reviewed  LIPASE, BLOOD - Abnormal; Notable for the following components:      Result Value   Lipase 327 (*)    All other components within normal limits  COMPREHENSIVE METABOLIC PANEL - Abnormal; Notable for the following components:   Glucose, Bld 280 (*)    Alkaline Phosphatase 140 (*)    All other components within normal limits  CBC - Abnormal; Notable for the following components:   WBC 11.7 (*)    RBC 5.36 (*)    MCV 79.1 (*)    MCH 25.4 (*)    All other components within normal limits  URINALYSIS, ROUTINE W REFLEX MICROSCOPIC  I-STAT BETA HCG BLOOD, ED ([facility], [facility], [facility] ONLY)    EKG None  Radiology Ct Abdomen Pelvis W Contrast  Result Date: 03/08/2019 CLINICAL DATA:  Upper abdominal pain.  Vomiting EXAM: CT ABDOMEN AND PELVIS WITH CONTRAST TECHNIQUE: Multidetector CT imaging of the abdomen and pelvis was performed using the standard protocol following bolus administration of intravenous contrast. CONTRAST:  151m OMNIPAQUE IOHEXOL 300 MG/ML  SOLN COMPARISON:  None. FINDINGS: Lower chest: Lung bases are clear. No effusions. Heart is normal size. Hepatobiliary: No focal hepatic abnormality. Gallbladder unremarkable. Pancreas: No focal abnormality or ductal dilatation. Spleen: No focal abnormality.  Normal size. Adrenals/Urinary Tract: No adrenal abnormality. No focal renal abnormality. No stones or hydronephrosis. Urinary bladder is  unremarkable. Stomach/Bowel: Normal appendix. Stomach, large and small bowel grossly unremarkable. Vascular/Lymphatic: Aortic atherosclerosis. No enlarged abdominal or pelvic lymph nodes. Reproductive: Uterus and adnexa  unremarkable.  No mass. Other: No free fluid or free air. Musculoskeletal: No acute bony abnormality. IMPRESSION: No acute findings in the abdomen or pelvis. Electronically Signed   By: Rolm Baptise M.D.   On: 03/08/2019 00:13    Procedures Procedures (including critical care time)  Medications Ordered in ED Medications  sodium chloride 0.9 % bolus 1,000 mL (has no administration in time range)  ondansetron (ZOFRAN) injection 4 mg (has no administration in time range)  morphine 4 MG/ML injection 4 mg (has no administration in time range)     Initial Impression / Assessment and Plan / ED Course  I have reviewed the triage vital signs and the nursing notes.  Pertinent labs & imaging results that were available during my care of the patient were reviewed by me and considered in my medical decision making (see chart for details).  Clinical Course as of Mar 07 912  Mon Mar 07, 3563  4126 57 year old female diabetic here with 1 week of upper abdominal pain and more recently nausea and vomiting.  Differential includes gastritis, pancreatitis, obstruction, cholelithiasis, cholecystitis, colitis.  Lab work showing a mildly elevated white count of 11.  Glucose is elevated to 80.  Alk phos is up the rest of her LFTs are normal.  Lipase is markedly elevated at 327.  She still has her gallbladder and does not drink alcohol.  Less likely to be gallstone pancreatitis in the setting of fairly normal LFTs.  Getting a CAT scan of her abdomen pelvis and giving her some fluids and pain medication.   [MB]  2257 Reviewed patient's medications with pharmacy.  Jardiance does seem to be associate with possibly causing pancreatitis.   [MB]  2341 I discussed with the internal medicine team who presumptively accepts the patient once her Covid and CAT scan are resulted.  I reviewed this with my partner Dr. Leonides Schanz who accepts the patient in transfer until her testing is finalized.   [MB]    Clinical Course User  Index [MB] Hayden Rasmussen, MD   Kristen Garrett was evaluated in Emergency Department on 03/07/2019 for the symptoms described in the history of present illness. She was evaluated in the context of the global COVID-19 pandemic, which necessitated consideration that the patient might be at risk for infection with the SARS-CoV-2 virus that causes COVID-19. Institutional protocols and algorithms that pertain to the evaluation of patients at risk for COVID-19 are in a state of rapid change based on information released by regulatory bodies including the CDC and federal and state organizations. These policies and algorithms were followed during the patient's care in the ED.      Final Clinical Impressions(s) / ED Diagnoses   Final diagnoses:  Pain of upper abdomen  Acute pancreatitis without infection or necrosis, unspecified pancreatitis type    ED Discharge Orders    None       Hayden Rasmussen, MD 03/08/19 [phone]

## 2019-03-07 NOTE — ED Provider Notes (Signed)
11:37 PM  Assumed care from Dr. Melina Copa.  Patient here with abdominal pain.  Has elevated lipase.  CTAP pending. IM resident team aware and will admit.   12:55 AM  Pt's CTAP is negative.  Will update IM team.   1:15 AM  IM admitting patient for obs.  They will place admission orders.  I reviewed all nursing notes, vitals, pertinent previous records, EKGs, lab and urine results, imaging (as available).    Kristen Garrett, Delice Bison, DO 03/08/19 0122

## 2019-03-07 NOTE — ED Triage Notes (Signed)
Pt arrives POV for eval of blt UQ pain x 7 days. Seen at [facility] for same on 9/5. Endorses ongoing vomiting and pain since that time.

## 2019-03-08 ENCOUNTER — Other Ambulatory Visit: Payer: Self-pay

## 2019-03-08 DIAGNOSIS — E119 Type 2 diabetes mellitus without complications: Secondary | ICD-10-CM | POA: Diagnosis not present

## 2019-03-08 DIAGNOSIS — E559 Vitamin D deficiency, unspecified: Secondary | ICD-10-CM | POA: Diagnosis present

## 2019-03-08 DIAGNOSIS — K3184 Gastroparesis: Secondary | ICD-10-CM | POA: Diagnosis present

## 2019-03-08 DIAGNOSIS — K5900 Constipation, unspecified: Secondary | ICD-10-CM | POA: Diagnosis present

## 2019-03-08 DIAGNOSIS — R112 Nausea with vomiting, unspecified: Secondary | ICD-10-CM | POA: Diagnosis not present

## 2019-03-08 DIAGNOSIS — R748 Abnormal levels of other serum enzymes: Secondary | ICD-10-CM

## 2019-03-08 DIAGNOSIS — Z8249 Family history of ischemic heart disease and other diseases of the circulatory system: Secondary | ICD-10-CM | POA: Diagnosis not present

## 2019-03-08 DIAGNOSIS — Z888 Allergy status to other drugs, medicaments and biological substances status: Secondary | ICD-10-CM

## 2019-03-08 DIAGNOSIS — E1165 Type 2 diabetes mellitus with hyperglycemia: Secondary | ICD-10-CM | POA: Diagnosis present

## 2019-03-08 DIAGNOSIS — E1142 Type 2 diabetes mellitus with diabetic polyneuropathy: Secondary | ICD-10-CM | POA: Diagnosis present

## 2019-03-08 DIAGNOSIS — Z20828 Contact with and (suspected) exposure to other viral communicable diseases: Secondary | ICD-10-CM | POA: Diagnosis present

## 2019-03-08 DIAGNOSIS — E785 Hyperlipidemia, unspecified: Secondary | ICD-10-CM | POA: Diagnosis present

## 2019-03-08 DIAGNOSIS — R6881 Early satiety: Secondary | ICD-10-CM | POA: Diagnosis not present

## 2019-03-08 DIAGNOSIS — J302 Other seasonal allergic rhinitis: Secondary | ICD-10-CM | POA: Diagnosis present

## 2019-03-08 DIAGNOSIS — I10 Essential (primary) hypertension: Secondary | ICD-10-CM | POA: Diagnosis present

## 2019-03-08 DIAGNOSIS — F17210 Nicotine dependence, cigarettes, uncomplicated: Secondary | ICD-10-CM | POA: Diagnosis present

## 2019-03-08 DIAGNOSIS — Z794 Long term (current) use of insulin: Secondary | ICD-10-CM

## 2019-03-08 DIAGNOSIS — Z79899 Other long term (current) drug therapy: Secondary | ICD-10-CM | POA: Diagnosis not present

## 2019-03-08 DIAGNOSIS — Z833 Family history of diabetes mellitus: Secondary | ICD-10-CM | POA: Diagnosis not present

## 2019-03-08 DIAGNOSIS — E1143 Type 2 diabetes mellitus with diabetic autonomic (poly)neuropathy: Secondary | ICD-10-CM | POA: Diagnosis present

## 2019-03-08 DIAGNOSIS — R1010 Upper abdominal pain, unspecified: Secondary | ICD-10-CM | POA: Diagnosis present

## 2019-03-08 DIAGNOSIS — Z86718 Personal history of other venous thrombosis and embolism: Secondary | ICD-10-CM | POA: Diagnosis not present

## 2019-03-08 DIAGNOSIS — K859 Acute pancreatitis without necrosis or infection, unspecified: Secondary | ICD-10-CM

## 2019-03-08 DIAGNOSIS — Z7982 Long term (current) use of aspirin: Secondary | ICD-10-CM | POA: Diagnosis not present

## 2019-03-08 HISTORY — DX: Abnormal levels of other serum enzymes: R74.8

## 2019-03-08 LAB — COMPREHENSIVE METABOLIC PANEL
ALT: 24 U/L (ref 0–44)
AST: 16 U/L (ref 15–41)
Albumin: 3.1 g/dL — ABNORMAL LOW (ref 3.5–5.0)
Alkaline Phosphatase: 124 U/L (ref 38–126)
Anion gap: 8 (ref 5–15)
BUN: 14 mg/dL (ref 6–20)
CO2: 22 mmol/L (ref 22–32)
Calcium: 8.9 mg/dL (ref 8.9–10.3)
Chloride: 108 mmol/L (ref 98–111)
Creatinine, Ser: 0.83 mg/dL (ref 0.44–1.00)
GFR calc Af Amer: >60 mL/min (ref >60)
GFR calc non Af Amer: >60 mL/min (ref >60)
Glucose, Bld: 167 mg/dL — ABNORMAL HIGH (ref 70–99)
Potassium: 4.2 mmol/L (ref 3.5–5.1)
Sodium: 138 mmol/L (ref 135–145)
Total Bilirubin: 0.5 mg/dL (ref 0.3–1.2)
Total Protein: 6.2 g/dL — ABNORMAL LOW (ref 6.5–8.1)

## 2019-03-08 LAB — LIPID PANEL
Cholesterol: 219 mg/dL — ABNORMAL HIGH (ref 0–200)
HDL: 26 mg/dL — ABNORMAL LOW (ref >40)
LDL Cholesterol: 127 mg/dL — ABNORMAL HIGH (ref 0–99)
Total CHOL/HDL Ratio: 8.4 RATIO
Triglycerides: 329 mg/dL — ABNORMAL HIGH (ref <150)
VLDL: 66 mg/dL — ABNORMAL HIGH (ref 0–40)

## 2019-03-08 LAB — CBC WITH DIFFERENTIAL/PLATELET
Abs Immature Granulocytes: 0.03 10*3/uL (ref 0.00–0.07)
Basophils Absolute: 0 10*3/uL (ref 0.0–0.1)
Basophils Relative: 0 %
Eosinophils Absolute: 0.1 10*3/uL (ref 0.0–0.5)
Eosinophils Relative: 1 %
HCT: 40.5 % (ref 36.0–46.0)
Hemoglobin: 12.6 g/dL (ref 12.0–15.0)
Immature Granulocytes: 0 %
Lymphocytes Relative: 37 %
Lymphs Abs: 3.6 10*3/uL (ref 0.7–4.0)
MCH: 24.6 pg — ABNORMAL LOW (ref 26.0–34.0)
MCHC: 31.1 g/dL (ref 30.0–36.0)
MCV: 79.1 fL — ABNORMAL LOW (ref 80.0–100.0)
Monocytes Absolute: 0.7 10*3/uL (ref 0.1–1.0)
Monocytes Relative: 7 %
Neutro Abs: 5.2 10*3/uL (ref 1.7–7.7)
Neutrophils Relative %: 55 %
Platelets: 240 10*3/uL (ref 150–400)
RBC: 5.12 MIL/uL — ABNORMAL HIGH (ref 3.87–5.11)
RDW: 14 % (ref 11.5–15.5)
WBC: 9.6 10*3/uL (ref 4.0–10.5)
nRBC: 0 % (ref 0.0–0.2)

## 2019-03-08 LAB — URINALYSIS, ROUTINE W REFLEX MICROSCOPIC
Bacteria, UA: NONE SEEN
Bilirubin Urine: NEGATIVE
Glucose, UA: >=500 mg/dL — AB
Ketones, ur: NEGATIVE mg/dL
Leukocytes,Ua: NEGATIVE
Nitrite: NEGATIVE
Protein, ur: 30 mg/dL — AB
Specific Gravity, Urine: 1.033 — ABNORMAL HIGH (ref 1.005–1.030)
pH: 5 (ref 5.0–8.0)

## 2019-03-08 LAB — PHOSPHORUS: Phosphorus: 4.1 mg/dL (ref 2.5–4.6)

## 2019-03-08 LAB — SARS CORONAVIRUS 2 BY RT PCR (HOSPITAL ORDER, PERFORMED IN ~~LOC~~ HOSPITAL LAB): SARS Coronavirus 2: NEGATIVE

## 2019-03-08 LAB — MAGNESIUM: Magnesium: 2 mg/dL (ref 1.7–2.4)

## 2019-03-08 LAB — HIV ANTIBODY (ROUTINE TESTING W REFLEX): HIV Screen 4th Generation wRfx: NONREACTIVE

## 2019-03-08 MED ORDER — INSULIN ASPART 100 UNIT/ML ~~LOC~~ SOLN
0.0000 [IU] | Freq: Three times a day (TID) | SUBCUTANEOUS | Status: DC
  Administered 2019-03-08: 3 [IU] via SUBCUTANEOUS
  Administered 2019-03-09: 9 [IU] via SUBCUTANEOUS
  Administered 2019-03-10: 3 [IU] via SUBCUTANEOUS
  Administered 2019-03-10: 7 [IU] via SUBCUTANEOUS
  Administered 2019-03-10: 5 [IU] via SUBCUTANEOUS

## 2019-03-08 MED ORDER — ONDANSETRON HCL 4 MG/2ML IJ SOLN: 4.0000 mg | Freq: Four times a day (QID) | INTRAMUSCULAR | Status: DC | PRN

## 2019-03-08 MED ORDER — IOHEXOL 300 MG/ML  SOLN
100.0000 mL | Freq: Once | INTRAMUSCULAR | Status: AC | PRN
  Administered 2019-03-07: 100 mL via INTRAVENOUS

## 2019-03-08 MED ORDER — ATORVASTATIN CALCIUM 80 MG PO TABS
80.0000 mg | ORAL_TABLET | Freq: Every day | ORAL | Status: DC
  Administered 2019-03-09: 80 mg via ORAL
  Filled 2019-03-08 (×2): qty 1

## 2019-03-08 MED ORDER — ENOXAPARIN SODIUM 40 MG/0.4ML ~~LOC~~ SOLN
40.0000 mg | SUBCUTANEOUS | Status: DC
  Administered 2019-03-08: 40 mg via SUBCUTANEOUS
  Filled 2019-03-08 (×3): qty 0.4

## 2019-03-08 MED ORDER — HYDROMORPHONE HCL 1 MG/ML IJ SOLN: 0.5000 mg | Freq: Four times a day (QID) | INTRAMUSCULAR | Status: DC | PRN

## 2019-03-08 MED ORDER — GABAPENTIN 300 MG PO CAPS
600.0000 mg | ORAL_CAPSULE | Freq: Three times a day (TID) | ORAL | Status: DC
  Administered 2019-03-08 – 2019-03-10 (×7): 600 mg via ORAL
  Filled 2019-03-08 (×7): qty 2

## 2019-03-08 MED ORDER — GLUCERNA SHAKE PO LIQD
237.0000 mL | Freq: Two times a day (BID) | ORAL | Status: DC
  Administered 2019-03-08 – 2019-03-10 (×4): 237 mL via ORAL

## 2019-03-08 MED ORDER — ONDANSETRON HCL 4 MG PO TABS: 4.0000 mg | ORAL_TABLET | Freq: Four times a day (QID) | ORAL | Status: DC | PRN

## 2019-03-08 MED ORDER — SODIUM CHLORIDE 0.9 % IV SOLN
INTRAVENOUS | Status: AC
  Administered 2019-03-08: 04:00:00 via INTRAVENOUS

## 2019-03-08 MED ORDER — HYDROMORPHONE HCL 1 MG/ML IJ SOLN
0.5000 mg | INTRAMUSCULAR | Status: DC | PRN
  Administered 2019-03-08 – 2019-03-09 (×5): 0.5 mg via INTRAVENOUS
  Filled 2019-03-08 (×5): qty 1

## 2019-03-08 NOTE — Progress Notes (Signed)
Initial Nutrition Assessment  DOCUMENTATION CODES:   Not applicable  INTERVENTION:  Provide Glucerna Shake po BID, each supplement provides 220 kcal and 10 grams of protein.  Encourage adequate PO intake.   NUTRITION DIAGNOSIS:   Increased nutrient needs related to acute illness as evidenced by estimated needs.  GOAL:   Patient will meet greater than or equal to 90% of their needs  MONITOR:   PO intake, Supplement acceptance, Skin, Weight trends, Labs, I & O's  REASON FOR ASSESSMENT:   Malnutrition Screening Tool    ASSESSMENT:   58 year old woman with past medical history of hyperlipidemia and type 2 diabetes who presents with recent history of abdominal pain, nausea, vomiting, and constipation.  Pt unavailable during attempted time of contact. Per MD, no acute findings on CT abdomen. No signs of inflammatory process on the CT Abdomen. Abdominal pain, n/v likely gastroparesis per MD. Plans for gastric emptying study tomorrow. Meal completion 100% at lunch today. RD to order nutritional supplements to aid in caloric and protein needs. Noted per weight records, pt with a 7.7% weight loss in 2 months, which is significant for time frame.   Unable to complete Nutrition-Focused physical exam at this time.   Labs and medications reviewed.   Diet Order:   Diet Order            Diet NPO time specified  Diet effective midnight        Diet Carb Modified Fluid consistency: Thin; Room service appropriate? Yes  Diet effective now              EDUCATION NEEDS:   Not appropriate for education at this time  Skin:  Skin Assessment: Reviewed RN Assessment  Last BM:  9/6  Height:   Ht Readings from Last 1 Encounters:  03/08/19 5' 6.5" (1.689 m)    Weight:   Wt Readings from Last 1 Encounters:  03/08/19 81.3 kg    Ideal Body Weight:  60.22 kg  BMI:  Body mass index is 28.5 kg/m.  Estimated Nutritional Needs:   Kcal:  3295-1884  Protein:  85-95  grams  Fluid:  1.7 - 1.9 L/day    Corrin Parker, MS, RD, LDN Pager # [phone] After hours/ weekend pager # [phone]

## 2019-03-08 NOTE — Progress Notes (Signed)
Pt refused fingerstick to verify CBG. States she uses the [place] system at home and was waiting for her daughter to bring. MD aware and given permission to utilize [place] system to monitor CBGs. Pt has sensor to RUE. No complications reported/observed.

## 2019-03-08 NOTE — H&P (Signed)
Date: 03/08/2019               Patient Name:  Kristen Garrett MRN: [id number]  DOB: 1960/11/10 Age / Sex: 58 y.o., female   PCP: [name], MD         Medical Service: Internal Medicine Teaching Service         Attending Physician: Dr. Sid Falcon, MD    First Contact: Dr. Sherry Ruffing Pager: [phone]  Second Contact: Dr.  Karalee Height:        After Hours (After 5p/  First Contact Pager: [phone]  weekends / holidays): Second Contact Pager: [phone]   Chief Complaint: nausea and vomiting   History of Present Illness:  Kristen Garrett is a 58yo female with PMH HLD, TIIDM presenting with 10 days of intermittent nausea and vomiting after she eats. She noticed 10 days ago that she started to have band like pain across her abdomen. 7 days ago she went to urgent care for this and was told she had constipation or possible IBS but presents this evening as the pain has continued. She states the medication given for constipation did not help and she has not had constipation or diarrhea.  Her symptoms include decreased appetite with nause and vomiting after she eats. She states this has never happened before. She was able to eat some light oatmeal this morning but had a slice of pizza around lunch which quickly came back up. Vomit has been clear and with bile only. She has not had much of an appetite. She has been able to keep fluids down and is not sure if fluids alone are making her ill.  She denies recent cough, fever, shortness of breath, sick contacts. She has had increase urination but believes this is due to drinking more fluids as the urgent care physician told her to. She denies dizziness or weakness. She does not drink alcohol and has never really drank.  She was recently started on jardiance and has been avoiding some of her medications due to not being sure if one of them is causing her pain but has not stopped the jardiance. She was previously on a statin but states atorvastatin and  pravastatin make her muscles ache although lipitor has worked in the past.   Social:  She smokes 15 cigarettes daily for many, many years.  She does not drink or use drugs recreationally  She lives in [place] and has a good support system from her family. Her daughter was with her at bedside.   Family History:  Family History  Problem Relation Age of Onset  . Diabetes Mother   . Hypertension Mother   . Hyperlipidemia Mother   . Diabetes Father   . Hyperlipidemia Father   . Diabetes Brother      Meds:  Current Meds  Medication Sig  . aspirin 81 MG chewable tablet Chew 1 tablet (81 mg total) by mouth daily.  . cholecalciferol (VITAMIN D3) 25 MCG (1000 UT) tablet Take 1 tablet (1,000 Units total) by mouth daily.  . empagliflozin (JARDIANCE) 10 MG TABS tablet Take 10 mg by mouth daily.  Marland Kitchen gabapentin (NEURONTIN) 300 MG capsule TAKE 2 CAPSULES BY MOUTH THREE TIMES DAILY (Patient taking differently: Take 600 mg by mouth 3 (three) times daily. )  . insulin degludec (TRESIBA FLEXTOUCH) 100 UNIT/ML SOPN FlexTouch Pen Inject 0.16 mLs (16 Units total) into the skin daily.  Marland Kitchen lisinopril (ZESTRIL) 40 MG tablet Take 40 mg by mouth daily.  Marland Kitchen  Multiple Vitamin (MULTIVITAMIN WITH MINERALS) TABS tablet Take 1 tablet by mouth daily.  . [DISCONTINUED] Multiple Vitamin (MULTIVITAMIN) capsule Take 1 capsule by mouth daily. Reported on 12/31/2015     Allergies: Allergies as of 03/07/2019 - Review Complete 03/07/2019  Allergen Reaction Noted  . Glipizide Diarrhea 08/02/2015  . Metformin and related Diarrhea 08/02/2015   Past Medical History:  Diagnosis Date  . Diabetes mellitus   . DVT of lower limb, acute (HCC) Age 58   Went on blood thinner for a few months  . Hyperlipidemia   . Hyperlipidemia   . Vertigo      Review of Systems: A complete ROS was negative except as per HPI.   Physical Exam: Blood pressure 127/76, pulse 80, temperature 98.4 F (36.9 C), temperature source Oral, resp.  rate 16, height 5' 6.5" (1.689 m), weight 81.3 kg, last menstrual period 02/17/2015, SpO2 92 %.  Constitution: NAD, pleasant, supine  HENT: [place]/AT Eyes: no icterus or injection  Cardio: RRR, no m/r/g  Respiratory: CTA, no wheezing rales or rhonchi  Abdominal: TTP epigastric region, non-distended, soft, +BS  Neuro: a&ox3, normal affect  Skin: no jaundice, c/d/i    CT abdomen: No acute findings in the abdomen or pelvis.  Assessment & Plan by Problem: Principal Problem:   Pancreatitis, acute Active Problems:   DM (diabetes mellitus), type 2, uncontrolled (HCC)   HYPERTENSION, BENIGN SYSTEMIC   Acute Pancreatitis Sudden onset central abdominal pain beginning one week ago with serum lipase elevated to 357. CT scan without acute findings such as pancreatic inflammation or cholecystic abnormalities. She has no history of alcohol use. Possibly secondary to her chronic hypertrygliceridemia with previous trygliceride levels >600 and total cholesterol >200. She is not currently on a statin as she cannot afford her lipitor and has had myalgias with other statins in the past.  - Repeat lipid panel  - Keep NPO - Dilaudid q4h prn pain  - Received 1L bolus in ED, cont. 1  Type II DM  She uses Free Libre at home and will not tolerate finger sticks. She last took her [name] 16U and jardiance 10mg  po the evening of 9/7. Her daughter will be bringing  Diet: NPO VTE: Enoxaparin 40 mg IVF: Normal saline 125 mL/h Code: Full code  Dispo: Admit patient to Inpatient with expected length of stay greater than 2 midnights.  Signed: Dellia Cloudoe, Merri Dimaano, MD 03/08/2019, 6:03 AM  Pager: [phone]

## 2019-03-08 NOTE — Plan of Care (Signed)
  Problem: Clinical Measurements: Goal: Ability to maintain clinical measurements within normal limits will improve Outcome: Progressing Goal: Will remain free from infection Outcome: Progressing Goal: Diagnostic test results will improve Outcome: Progressing Goal: Respiratory complications will improve Outcome: Progressing   Problem: Activity: Goal: Risk for activity intolerance will decrease Outcome: Progressing   Problem: Coping: Goal: Level of anxiety will decrease Outcome: Progressing   

## 2019-03-08 NOTE — Progress Notes (Signed)
   Subjective: Patient with intermittent nausea and vomiting for approximately 10 days. She was also having constipation and epigastric pain. The pain is described as a band across upper abdomen. The N/V is associated with early satiety and eating. She has long history of diabetes, but reports better control recently. She has endorses peripheral neuropathy. Denies melena, hematemesis, and changes in vision.   Objective:  Vital signs in last 24 hours: Vitals:   03/08/19 0220 03/08/19 0300 03/08/19 0837 03/08/19 1357  BP: 127/76  132/80 140/77  Pulse: 80  64 73  Resp: 16  16 18   Temp: 98.4 F (36.9 C)  97.8 F (36.6 C) 98.4 F (36.9 C)  TempSrc: Oral  Oral Oral  SpO2: 92%  100% 97%  Weight:  81.3 kg    Height:  5' 6.5" (1.689 m)     Physical Exam Constitutional:      Appearance: She is well-developed.  Cardiovascular:     Rate and Rhythm: Normal rate and regular rhythm.  Pulmonary:     Effort: Pulmonary effort is normal.     Breath sounds: Normal breath sounds.  Abdominal:     General: Bowel sounds are normal.     Palpations: Abdomen is soft. There is no hepatomegaly or splenomegaly.     Tenderness: There is abdominal tenderness in the right upper quadrant and epigastric area.  Skin:    General: Skin is warm and dry.  Neurological:     Mental Status: She is alert.     Assessment/Plan:  Principal Problem:   Pancreatitis, acute Active Problems:   DM (diabetes mellitus), type 2, uncontrolled (HCC)   HYPERTENSION, BENIGN SYSTEMIC   Abnormal serum level of lipase   58 year old woman with past medical history of hyperlipidemia and type 2 diabetes who presents with recent history of abdominal pain, nausea, vomiting, and constipation.  #Abdominal pain, nausea, vomiting Patient has a history of uncontrolled diabetes with complication of peripheral neuropathy. Hgb A1C >14 one month ago. No acute findings on CT abdomen. No signs of inflammatory process on the CT Abdomen. It is  most likely patient is experiencing gastroparesis. She describes early satiety and then N/V. - NPO at midnight - Gastric emptying study  Type 2 DM Last A1C >14. Patient will not allow finger sticks. Allowing [employer]. On Tresiba 16units and jardiance 10 mg po. - sensitive sliding scale    Diet: Carb modified, NPO at midnight DVT ppx: Enoxaparin  Code: Full Code  Dispo: Anticipated discharge in approximately 1-2 day(s).   Tamsen Snider, MD PGY1  [phone]

## 2019-03-09 ENCOUNTER — Inpatient Hospital Stay (HOSPITAL_COMMUNITY): Payer: BLUE CROSS/BLUE SHIELD

## 2019-03-09 DIAGNOSIS — E1143 Type 2 diabetes mellitus with diabetic autonomic (poly)neuropathy: Principal | ICD-10-CM

## 2019-03-09 DIAGNOSIS — K3184 Gastroparesis: Secondary | ICD-10-CM

## 2019-03-09 DIAGNOSIS — R1010 Upper abdominal pain, unspecified: Secondary | ICD-10-CM

## 2019-03-09 MED ORDER — METOCLOPRAMIDE HCL 5 MG PO TABS
5.0000 mg | ORAL_TABLET | Freq: Three times a day (TID) | ORAL | Status: DC
  Administered 2019-03-09 – 2019-03-10 (×4): 5 mg via ORAL
  Filled 2019-03-09 (×4): qty 1

## 2019-03-09 MED ORDER — INSULIN GLARGINE 100 UNIT/ML ~~LOC~~ SOLN
16.0000 [IU] | Freq: Every day | SUBCUTANEOUS | Status: DC
  Administered 2019-03-09 (×2): 16 [IU] via SUBCUTANEOUS
  Filled 2019-03-09 (×3): qty 0.16

## 2019-03-09 MED ORDER — TECHNETIUM TC 99M SULFUR COLLOID
2.1000 | Freq: Once | INTRAVENOUS | Status: AC | PRN
  Administered 2019-03-09: 2.1 via INTRAVENOUS

## 2019-03-09 MED ORDER — EZETIMIBE 10 MG PO TABS
10.0000 mg | ORAL_TABLET | Freq: Every day | ORAL | Status: DC
  Administered 2019-03-10: 10 mg via ORAL
  Filled 2019-03-09: qty 1

## 2019-03-09 MED ORDER — HYDROMORPHONE HCL 1 MG/ML IJ SOLN
0.5000 mg | INTRAMUSCULAR | Status: AC | PRN
  Administered 2019-03-09 (×2): 0.5 mg via INTRAVENOUS
  Filled 2019-03-09 (×2): qty 1

## 2019-03-09 MED ORDER — HYDROMORPHONE HCL 1 MG/ML IJ SOLN
0.5000 mg | Freq: Once | INTRAMUSCULAR | Status: AC
  Administered 2019-03-09: 0.5 mg via INTRAVENOUS
  Filled 2019-03-09: qty 1

## 2019-03-09 NOTE — Plan of Care (Signed)
  Problem: Pain Managment: Goal: General experience of comfort will improve Outcome: Progressing   Problem: Elimination: Goal: Will not experience complications related to bowel motility Outcome: Progressing   

## 2019-03-09 NOTE — Progress Notes (Signed)
Patient is not on unit to check 12pm blood sugar

## 2019-03-09 NOTE — Progress Notes (Signed)
Pt utilizes the [place] [facility] meter to check blood glucose levels. Results for 03/08/2019 at 22:00  Result is  117.  On 03/09/2019 06:00 result is 335.

## 2019-03-09 NOTE — Progress Notes (Signed)
Subjective:  Kristen Garrett is a 58yoF with a PMH of HLD and uncontrolled DM2. She presents with intermittent nausea, vomiting, constipation, and constant epigastric abdominal pain. Her work up is notable for an elevated Lipase but had a negative CT Abd scan w/o stranding around the pancreas. Recently placed on NPO since 2300 03/08/2019 for preparation of gastric motility study this morning. Kristen Garrett continues to complain of abdominal pain but tolerates with Dilaudid administration. Pain remains constant and localizes to the epigastric region and RUQ. Tolerated solids and fluid intake yesterday but has not had a BM in the last 24 hours. She complains of recent increase in nausea this morning but denies vomiting, retching, or changes in urination status.  Overnight events include: patient had an elevated glucose this morning at 0600 increased from last BG test at 2200 last night. She does not tolerate blood sticks and uses [employer] machine.  Objective:  Vital signs in last 24 hours: Vitals:   03/08/19 1357 03/08/19 2018 03/09/19 0406 03/09/19 0815  BP: 140/77 131/75 133/77 126/67  Pulse: 73 73 76 64  Resp: 18 17 16 18   Temp: 98.4 F (36.9 C) 98.3 F (36.8 C) 98 F (36.7 C) 97.7 F (36.5 C)  TempSrc: Oral Oral Oral Oral  SpO2: 97% 96% 94% 99%  Weight:      Height:        Intake/Output Summary (Last 24 hours) at 03/09/2019 1054 Last data filed at 03/09/2019 0900 Gross per 24 hour  Intake 720 ml  Output 251 ml  Net 469 ml  **No intake since 2300 03/08/2019**  Blood Glucose: 9/9 0600: 335 mg/dL 9/8 [id number]: 960117 mg/dL 9/8 time?: 454167 mg/dL 9/7 time?: 098280 mg/dL  Lipid Panel     Component Value Date/Time   CHOL 219 (H) 03/08/2019 0409   CHOL 239 (H) 01/12/2019 1651   TRIG 329 (H) 03/08/2019 0409   HDL 26 (L) 03/08/2019 0409   HDL 30 (L) 01/12/2019 1651   CHOLHDL 8.4 03/08/2019 0409   VLDL 66 (H) 03/08/2019 0409   LDLCALC 127 (H) 03/08/2019 0409   LDLCALC Comment 01/12/2019  1651   Lipase     Component Value Date/Time   LIPASE 327 (H) 03/07/2019 2100      Physical Exam  Constitutional: She is well-developed, well-nourished, and in no distress.  HENT:  Head: Normocephalic and atraumatic.  Cardiovascular: Normal rate, regular rhythm and normal heart sounds. Exam reveals no gallop and no friction rub.  No murmur heard. Pulmonary/Chest: Effort normal. No respiratory distress. She has no wheezes. She has no rales. She exhibits no tenderness.  Abdominal: Soft. Bowel sounds are normal. She exhibits no distension and no mass. There is abdominal tenderness in the right upper quadrant and epigastric area. There is no rebound and no guarding.  Skin: Skin is warm. No rash noted. No erythema. No pallor.  Psychiatric: Affect normal.     Assessment/Plan:  Principal Problem:   Pancreatitis, acute Active Problems:   DM (diabetes mellitus), type 2, uncontrolled (HCC)   HYPERTENSION, BENIGN SYSTEMIC   Abnormal serum level of lipase  Kristen Garrett is a 58yo F with a PMH of HLD and uncontrolled DM2. She presents with a 10 day hx of abd pain, nausea, vomiting, and constipation.   #Abdominal pain, N/V/C Because of the negative CT ABD w/o stranding near the pancreas, this patient is less concerning for acute pancreatitis. Highest on the differential is gastroparesis secondary to poor blood glucose control. This is supported  by a long term history of elevated blood glucose and an A1c >14 one month ago. We will await the results of the gastric motility study from this morning to confirm gastroparesis. We can also consider resolving pancreatitis as a cause for the gastroparesis which could explain the recent onset of symptoms 10 days ago and elevated lipase. - If positive:  - pt can be treated with metoclopramide (5mg  3/day 15 min before meals)   - decrease opioid use and resort to alternative analgesics  - continue glycemic control - If negative:  - consider gastritis from  previous NSAID use or H. Pylori infection (less likely due to lack of inflammation on CT)  - consider constipation as main etiology (less likely due to normal TSH, calcium, potassium, and normal bowel sounds)  #DM2 This patients blood glucose was under control during her stay here but was taken off of her long acting insulin due to NPO status and intolerance of blood glucose sticks. Because of recent elevation in BG this morning we can: - re-continue Glargine 16 units - Sliding scale with insuline aspart - Continue using Libre for blood glucose checks  #HLD This patient is not interested in taking statins due to intolerance to muscle aches. Continues to have elevated cholesterol and triglycerides - Switch patient to ezetimibe - no side effects of muscle aches  #Dispo - Discharge in 1-2 days when patient can:  - tolerate a full diet  - Full hydration status  - Able to have normal BM - Pain will resolve on its own  Lacy Duverney, MSIII 11:57AM 03/09/19

## 2019-03-10 DIAGNOSIS — Z79899 Other long term (current) drug therapy: Secondary | ICD-10-CM

## 2019-03-10 MED ORDER — ACETAMINOPHEN 325 MG PO TABS: 650.0000 mg | ORAL_TABLET | Freq: Four times a day (QID) | ORAL | 0 refills | Status: DC | PRN

## 2019-03-10 MED ORDER — HYDROMORPHONE HCL 1 MG/ML IJ SOLN
0.5000 mg | Freq: Once | INTRAMUSCULAR | Status: AC
  Administered 2019-03-10: 0.5 mg via INTRAVENOUS
  Filled 2019-03-10: qty 1

## 2019-03-10 MED ORDER — EZETIMIBE 10 MG PO TABS: 10.0000 mg | ORAL_TABLET | Freq: Every day | ORAL | 2 refills | Status: DC

## 2019-03-10 MED ORDER — ACETAMINOPHEN 325 MG PO TABS: 650.0000 mg | ORAL_TABLET | Freq: Four times a day (QID) | ORAL | Status: DC | PRN

## 2019-03-10 MED ORDER — METOCLOPRAMIDE HCL 5 MG PO TABS: 5.0000 mg | ORAL_TABLET | Freq: Three times a day (TID) | ORAL | 0 refills | Status: DC

## 2019-03-10 NOTE — Plan of Care (Signed)
  Problem: Pain Managment: Goal: General experience of comfort will improve Outcome: Progressing   Problem: Nutrition: Goal: Adequate nutrition will be maintained Outcome: Progressing   

## 2019-03-10 NOTE — Progress Notes (Signed)
Patient discharging home. Discharge instructions explained to patient and she verbalized understanding. Took all personal belongings. No further questions or concerns voiced.  

## 2019-03-10 NOTE — Discharge Instructions (Signed)
Kristen Garrett, as we discussed, you have been diagnosed with gastroparesis which means that your stomach is not moving as well as it normally does as if it was paralyzed. This is most likely a complication of the the elevated glucose in your blood. As we discussed this morning, most people are able to pass food from their stomach within 30 minutes. As it took you a lot longer than this when we looked at it on a scan, we believe that the pain you are experiencing is due to the back-up of food in your stomach. The best way to manage this is by changing your diet to eat very small meals many times a day as opposed to eating larger meals 3 times a day. Along with this, if you could increase the amount of fiber in your diet, your stomach may have an easier time digesting food. We also believe that the medication, metoclopramide, that we have been giving you before each meal will help increase the activity of your stomach. Finally, the best way to help prevent further damage to your stomach is to continue reducing the glucose in your blood as you have been doing well with thus far. We would like you to continue using your 16 units of insulin-Tresiba as well as 10mg  of Jardiance each day. Please follow up with you primary care provider in one week to continue the care of your symptoms.           Gastroparesis Nutrition Therapy  Gastroparesis means that your stomach empties very slowly. This happens when the nerves to your stomach are damaged or do not work properly. This can cause bloating, stomach discomfort or pain, feeling full after eating only a small amount of food, nausea, or vomiting.    If you have diabetes in addition to gastroparesis, it is important to control your blood glucose. This will help the stomach empty. Tips  Following these tips may help your stomach empty faster:  Eat small, frequent meals (4 to 6 times per day).   Do not eat solid foods that are high in fat and do not add  too much fat to foods. See the Foods Not Recommended table for foods that are high fat.  o High-fat solid foods may delay the emptying of your stomach. o Liquids that contain fat, such as milkshakes, may be tolerated and can provide needed calories.  Do not eat foods high in fiber. Do not take fiber supplements or fiber bulking agents for constipation.  Do not eat foods that increase acid reflux:  o Acidic, spicy, fried and greasy foods o Caffeine o Mint  Do not drink alcohol or smoke  Do not drink carbonated beverages, as they increase bloating.  Chew foods well before swallowing. Solid foods in the stomach do not empty well. If you have difficulty tolerating solid foods, ground foods may be better.   If symptoms are severe, semi-solid foods or liquids may need to be your main food sources. Choose liquid nutritional supplements that have less than or equal to 2 grams fiber per serving.  Sit upright while eating and sit upright or walk after meals.  Do not lie down for 3 to 4 hours after eating to avoid reflux or regurgitation.  o If you wish to nap during the day, nap first and then eat.   Drinking fluids at meals can take up room in your stomach, and you might not get enough calories. At every meal, first eat a grain food  and a protein food or dairy product if your body can tolerate it. Drink fluids with calories. It may be better to delay fluids until after the meal and drink more between meals.   Foods Recommended Food Group Foods Recommended   Grains Choose grain foods with less than 2 grams of fiber per serving; these will be made with white flour Crackers: saltines or graham crackers Cold cereal: puffed rice Cream of rice or wheat Grits (fine ground) Gluten free low fiber foods Pretzels White bread, toasted White rice, cook until very soft  Protein Foods Lean meat and poultry: well-cooked, very tender, moist, and chopped fine Fish: tuna, salmon, or white fish Egg whites,  scrambled Peanut butter (limit to 1 tablespoon at a time)  Dairy Milk*, drink 2% if tolerated to get more nutrients or lactose-free 2% milk Fortified non-dairy milks: almond, cashew, coconut, or rice (be aware that these options are not good sources of protein so you will need to eat an additional protein food) Fortified pea milk or soymilk (may cause gas and bloating for some) Instant breakfast* (pre-made lactose-free is sold in bottles) Milkshakes* (try blending in  to  cup canned fruit) Ice cream* (low-fat may be tolerated better; use in milkshakes to increase calories) Frozen yogurt Yogurt* Puddings and custard* Sherbet Liquid nutritional supplements with less than or equal to 2 grams fiber per 1 cup serving *Use lactose-free varieties to reduce gas and bloating  Vegetables Canned and well-cooked vegetables without seeds, skins or hulls Carrots, cooked Mashed potatoes (white, red or yellow) Sweet potato  Fruit Canned, soft and well-cooked fruits without seeds, skins or membranes Applesauce Banana, mashed may be tolerated better Diced peaches/pears fruit cups in juice Melon, very soft, cut into small pieces Fruit nectar juices  Oils When possible choose oils rather than solid fats Canola or olive oil Margarine  Other Clear soup Gelatin Popsicles     Foods Not Recommended Food Group Foods Not Recommended   Grains Bran Grains foods with 2 or more grams of fiber per serving: barley, brown rice, kasha, quinoa Popcorn Whole grain and high-fiber cereals, including oats or granola Whole grain bread or pasta  Protein Foods Fried meats, poultry or fish Sausage, bacon or hot dogs Seafood Tough meat, meat with gristle: steak, roast beef or pork chops Beans, peas or lentils Nuts  Dairy Cheese slices Liquid nutritional supplements that have more than 2 grams fiber per serving Pea milk, soymilk (may increase gas and bloating)  Vegetables Raw or undercooked vegetables Alfalfa,  asparagus, bean sprouts, broccoli, brussels sprouts, cabbage, cauliflower, corn, green peas or any other kind of peas, lima beans, mushrooms, okra, onions, parsnips, peppers, pickles, potato skins, or spinach  Fruit Fresh fruit except for the ones in the foods recommended table Acidic fruit and juices: oranges/orange juice, grapefruit/grapefruit juice, tomatoes/tomato juice Avocado Berries Coconut Dried fruit Fruit skin Mandarin oranges Pineapple  Oils Fried foods of any type  Other Coffee Olives or pickles Pizza Salsa Sushi  Gastroparesis Sample 1-Day Menu View Nutrient Info  Breakfast 1 slice white toast (1 carbohydrate serving)  1 teaspoon margarine, soft, tub  cup egg substitute 1 cup peach nectar (2 carbohydrate servings)  Morning Snack Smoothie made with:  small banana (1 carbohydrate serving)  1/3 cup [place] strawberry yogurt ( carbohydrate serving)  1 cup 2% milk (1 carbohydrate serving)  Lunch 2 ounces canned chicken  1 teaspoon mayonnaise  9 saltine crackers (1 carbohydrate servings)  cup applesauce (1 carbohydrate serving)  Afternoon Snack 1  slice white toast (1 carbohydrate serving)  1 tablespoon smooth peanut butter  Evening Meal 2 ounces baked fish   cup mashed potatoes (1 carbohydrate serving) 1 teaspoon olive oil  1 cup 2% milk (1 carbohydrate serving)  Evening Snack 1 packet instant breakfast (1 carbohydrate servings)  1 cup 2% milk (1 carbohydrate serving)   Corrin Parker, MS, RD, LDN Clinical dietitian Office Phone # [phone]

## 2019-03-10 NOTE — Progress Notes (Signed)
Subjective:  Kristen Garrett is a 38yoF with a PMH notable for HLF and uncontrolled DM2. She has seen on our service for 5 days and has now presented with an 11day history of abdominal pain localizing to the RUQ and Epigastric region. She also endorses intermittent nausea, vomiting, and constipation. She had a gastric motility test yesterday positive for gastroparesis. Today she reports some pain "slightly less than before." She has not recently received pain medication other than the administration of dilaudid last night at 2300. She denies nausea, vomiting, and had a small normal BM this morning. She reports a normal appetite and has been taking 5mg  metoclopramide 15 min before meals since last night.   She was switched to Ezetimibe from atorvastatin due to muscle aches. No blood glucose sugar taken overnight.    Objective:  Vital signs in last 24 hours: Vitals:   03/09/19 1950 03/10/19 0654 03/10/19 0750 03/10/19 1341  BP: 132/71 (!) 149/81 130/88 139/80  Pulse: 84 76 78 78  Resp: 16 16 18 18   Temp: 98.4 F (36.9 C) 98.4 F (36.9 C) 98.4 F (36.9 C) 99.3 F (37.4 C)  TempSrc: Oral Oral Oral Oral  SpO2: 97% 99% 100% 100%  Weight:      Height:       Weight change:   Intake/Output Summary (Last 24 hours) at 03/10/2019 1402 Last data filed at 03/10/2019 1314 Gross per 24 hour  Intake 960 ml  Output -  Net 960 ml   Lipid Panel     Component Value Date/Time   CHOL 219 (H) 03/08/2019 0409   CHOL 239 (H) 01/12/2019 1651   TRIG 329 (H) 03/08/2019 0409   HDL 26 (L) 03/08/2019 0409   HDL 30 (L) 01/12/2019 1651   CHOLHDL 8.4 03/08/2019 0409   VLDL 66 (H) 03/08/2019 0409   LDLCALC 127 (H) 03/08/2019 0409   LDLCALC Comment 01/12/2019 1651   Blood Glucose  9/10: None 9/9 0600: 335 mg/dL 9/8 2200: 117 mg/dL 9/8 time?: 167 mg/dL 9/7 time?: 280 mg/dL  Physical Exam Constitutional:      Appearance: She is well-developed. She is not toxic-appearing.  HENT:     Head:  Normocephalic and atraumatic.  Cardiovascular:     Rate and Rhythm: Normal rate and regular rhythm.     Heart sounds: Normal heart sounds. No murmur. No friction rub. No gallop.   Pulmonary:     Effort: Pulmonary effort is normal. No respiratory distress.     Breath sounds: Normal breath sounds. No wheezing or rales.  Abdominal:     General: Abdomen is flat. Bowel sounds are normal. There is no distension or abdominal bruit.     Palpations: Abdomen is soft. There is no splenomegaly or mass.     Tenderness: There is abdominal tenderness in the right upper quadrant and epigastric area. There is no guarding or rebound.     Comments: Significantly less tender than previous exam yesterday.  Neurological:     Mental Status: She is alert.  Psychiatric:        Mood and Affect: Mood normal.     Assessment/Plan:  Principal Problem:   Pancreatitis, acute Active Problems:   DM (diabetes mellitus), type 2, uncontrolled ([place])   HYPERTENSION, BENIGN SYSTEMIC   Abnormal serum level of lipase   Pain of upper abdomen  Kristen Garrett is a 80yoF that has a PMH notable for HLF and uncontrolled DM2. She has an 11 day hx of RUQ and Epigastric pain  with N/V/C and a positive scan for gastroparesis  #Abdominal Pain At this point we now know that the etiology of her pain is from gastroparesis. This is due to the 70% retention of gastric contents after 120min where >35% is diagnostic for severe gastroparesis. Our further management should be focused on addressing her decreased gastric motility secondary to DM2. - Educate patient on the nature of her diagnosis, explain the importance of getting BG under control - Continue administration of Reglan to increase gastric emptying - give 7-9 days and ask pt to follow up with PCP - cautious of QT prolongation and extrapyramidal side effects from D2 antagonists - Consult nutrition and advise small meals many times throughout the day - Discontinue the use of Dilaudid due  to concerns for decreasing motility - consider tylenol for pain control (reason: pt does not think tramadol helps and cannot take NSAIDs due to prior GI bleeds) - Continue Ondansetron per outpatient regimen to decrease nausea  #DM2 This patients blood glucose is under control during her stay. Her A1cs were trending downards so we can continue her previous outpatient regimen - Return patient to [place] - 16units - Return patient to Jardiance - 10mg  per day  #HLD This patient seems to be tolerating Ezetimibe, she can most likely continue this outpatient. - Continue Ezetimibe 10mg  daily and follow up with PCP for lipid levels  #Dispo This patient has a clear knowledge of her diagnosis, she is able to tolerate food and drinks normally, and she had normal BM this morning. She can be discharged this afternoon.    LOS: 2 days   Devra Dopphew, Marico Buckle, Medical Student 03/10/2019, 2:02 PM

## 2019-03-10 NOTE — Progress Notes (Signed)
Nutrition Follow-up   RD working remotely.  DOCUMENTATION CODES:   Not applicable  INTERVENTION:  Continue Glucerna Shake po BID, each supplement provides 220 kcal and 10 grams of protein.  Gastroparesis nutrition therapy given.   NUTRITION DIAGNOSIS:   Increased nutrient needs related to acute illness as evidenced by estimated needs; ongoing  GOAL:   Patient will meet greater than or equal to 90% of their needs; met  MONITOR:   PO intake, Supplement acceptance, Skin, Weight trends, Labs, I & O's  REASON FOR ASSESSMENT:   Malnutrition Screening Tool    ASSESSMENT:   58 year old woman with past medical history of hyperlipidemia and type 2 diabetes who presents with recent history of abdominal pain, nausea, vomiting, and constipation.  Gastric emptying study positive for gastroparesis. Gastroparesis nutrition therapy given. Handout from the Academy of Nutrition and Dietetics Manual placed in pt discharge instructions. Meal completion 100%. Pt currently has Glucerna shake ordered and has been consuming them. RD to continue with current orders to aid in adequate nutrition.   Labs and medications reviewed.   Diet Order:   Diet Order            DIET SOFT Room service appropriate? Yes; Fluid consistency: Thin  Diet effective now              EDUCATION NEEDS:   Not appropriate for education at this time  Skin:  Skin Assessment: Reviewed RN Assessment  Last BM:  9/6  Height:   Ht Readings from Last 1 Encounters:  03/08/19 5' 6.5" (1.689 m)    Weight:   Wt Readings from Last 1 Encounters:  03/08/19 81.3 kg    Ideal Body Weight:  60.22 kg  BMI:  Body mass index is 28.5 kg/m.  Estimated Nutritional Needs:   Kcal:  3729-4262  Protein:  85-95 grams  Fluid:  1.7 - 1.9 L/day    Corrin Parker, MS, RD, LDN Pager # [phone] After hours/ weekend pager # [phone]

## 2019-03-10 NOTE — Progress Notes (Signed)
Called MD back again concerning pain meds, he stated that he will let attending know. Awaiting call back.

## 2019-03-10 NOTE — Plan of Care (Signed)

## 2019-03-10 NOTE — Progress Notes (Signed)
Called MD back since no orders were received for pain meds. Received call back and given a different number to page. Awaiting call back.

## 2019-03-10 NOTE — Progress Notes (Signed)
Pt c/o epigastric pain. Nothing ordered for pain. MD paged and received call back stating they will let the attending know.

## 2019-03-10 NOTE — Discharge Summary (Signed)
Name: Kristen Garrett MRN: [id number] DOB: 1960-09-10 58 y.o. PCP: [name], MD  Date of Admission: 03/07/2019  8:51 PM Date of Discharge:  Attending Physician: Sid Falcon, MD  Discharge Diagnosis: 1. Gastroparesis   Discharge Medications: Allergies as of 03/10/2019      Reactions   Glipizide Diarrhea   Metformin And Related Diarrhea      Medication List    STOP taking these medications   atorvastatin 80 MG tablet Commonly known as: LIPITOR   cetirizine 10 MG tablet Commonly known as: ZYRTEC   dicyclomine 10 MG capsule Commonly known as: BENTYL   lisinopril-hydrochlorothiazide 20-12.5 MG tablet Commonly known as: ZESTORETIC   ondansetron 4 MG tablet Commonly known as: ZOFRAN   polyethylene glycol 17 g packet Commonly known as: MIRALAX / GLYCOLAX     TAKE these medications   acetaminophen 325 MG tablet Commonly known as: TYLENOL Take 2 tablets (650 mg total) by mouth every 6 (six) hours as needed for moderate pain.   aspirin 81 MG chewable tablet Chew 1 tablet (81 mg total) by mouth daily.   cholecalciferol 25 MCG (1000 UT) tablet Commonly known as: VITAMIN D3 Take 1 tablet (1,000 Units total) by mouth daily.   Dexcom G6 Receiver Devi 1 each by Does not apply route 6 (six) times daily. Dx code: E11.42; she is insulin requiring.   Dexcom G6 Sensor Misc 1 each by Does not apply route 6 (six) times daily. Dx code: E11.42; she is insulin requiring.   FreeStyle Libre 14 Day Sensor Misc 1 each by Does not apply route QID.   Dexcom G6 Transmitter Misc 1 each by Does not apply route 6 (six) times daily. Dx code: E11.42; she is insulin requiring.   empagliflozin 10 MG Tabs tablet Commonly known as: JARDIANCE Take 10 mg by mouth daily.   ezetimibe 10 MG tablet Commonly known as: ZETIA Take 1 tablet (10 mg total) by mouth daily. Start taking on: March 11, 2019   gabapentin 300 MG capsule Commonly known as: NEURONTIN TAKE 2 CAPSULES BY  MOUTH THREE TIMES DAILY   glucose blood test strip Commonly known as: RELION GLUCOSE TEST STRIPS Use as instructed   lisinopril 40 MG tablet Commonly known as: ZESTRIL Take 40 mg by mouth daily.   metoCLOPramide 5 MG tablet Commonly known as: REGLAN Take 1 tablet (5 mg total) by mouth 3 (three) times daily before meals for 7 days.   multivitamin with minerals Tabs tablet Take 1 tablet by mouth daily.   NovoFine Plus 32G X 4 MM Misc Generic drug: Insulin Pen Needle USE FOR DAILY INSULIN INJECTIONS   ReliOn Lancing Device Kit 1 Device by Does not apply route daily.   ReliOn Ultima Glucose System w/Device Kit 1 Device by Does not apply route daily.   Tyler Aas FlexTouch 100 UNIT/ML Sopn FlexTouch Pen Generic drug: insulin degludec Inject 0.16 mLs (16 Units total) into the skin daily.       Disposition and follow-up:   [name] was discharged from [facility] in Stable condition.  At the hospital follow up visit please address:  1.  Counseling for management of Gastroparesis  - Continued use of Reglan 5-29m TID before meals (no more than 12 weeks)  - Counseling on small meals multiple times throughout the day  - Diet consisting of high fiber and easily digestible food  2.  Managing Blood Glucose and HLD  - Continue Tresiba (16 units), Jardiance (18mdaily), Ezetimibe (1061m  daily)  - Uncontrolled, continue to counsel patient    2.  Labs / imaging needed at time of follow-up: None  3.  Pending labs/ test needing follow-up: None  Follow-up Appointments: This patient should follow up 7-9 days from discharge to follow up on Gastroparesis management with Reglan. Follow-up Information    [name], MD Follow up.   Specialty: Internal Medicine Contact information: [address] [address] [phone]           Hospital Course by problem list: [name] is a 89yoF that has a PMH notable for HLF and uncontrolled  DM2. She has an 11 day hx of RUQ and Epigastric pain with N/V/C and a positive scan for gastroparesis  1. Abdominal Pain: This patient was worked up for acute pancreatitis, gastritis, AMI, and constipation. CT abdomen was negative for pancreatitis and gastritis. Ultimately patient was made NPO and underwent a gastric emptying study. She was found to have 70% retention after two hours making her positive for gastroparesis. She has been made aware of her diagnosis and educated on 1) proper nutrition 2) importance of BG control and 3) compliance to use of metoclopramide. Her pain and nausea can be treated with tylenol and ondansetron.  2. Diabetes Mellitus Type II: This patient presented at our clinic with a long time history of uncontrolled diabetes. Her A1c a month ago was >14 but was trending downward with the use of Tresiba 16 units and 24m daily of Jardiance. We switched her temporarily to long acting Glargine as well as a used a sliding scale to control blood sugars at the hospital. We believe she can be returned to her outpatient medication of Tresibe and Jardiance upon discharge.  3. Hyperlipidemia: This patient has a history of elevated cholesterol. She complained of muscle aches and various other side effects with use of atorvastatin. We switched her to ezetimibe and she appears to tolerate this well.   Discharge Vitals:   BP 139/80   Pulse 78   Temp 99.3 F (37.4 C) (Oral)   Resp 18   Ht 5' 6.5" (1.689 m)   Wt 81.3 kg   LMP 02/17/2015   SpO2 100%   BMI 28.50 kg/m   Pertinent Labs, Studies, and Procedures:  CBC Latest Ref Rng & Units 03/08/2019 03/07/2019 02/23/2017  WBC 4.0 - 10.5 K/uL 9.6 11.7(H) 9.4  Hemoglobin 12.0 - 15.0 g/dL 12.6 13.6 13.1  Hematocrit 36.0 - 46.0 % 40.5 42.4 40.4  Platelets 150 - 400 K/uL 240 258 250   BMP Latest Ref Rng & Units 03/08/2019 03/07/2019 01/12/2019  Glucose 70 - 99 mg/dL 167(H) 280(H) 291(H)  BUN 6 - 20 mg/dL _0 Creatinine 0.44 - 1.00 mg/dL 0.83  0.93 0.73  BUN/Creat Ratio 9 - 23 - - 16  Sodium 135 - 145 mmol/L 138 135 134  Potassium 3.5 - 5.1 mmol/L 4.2 4.3 4.1  Chloride 98 - 111 mmol/L 108 103 102  CO2 22 - 32 mmol/L 22 23 19(L)  Calcium 8.9 - 10.3 mg/dL 8.9 9.4 9.4   Lipid Panel     Component Value Date/Time   CHOL 219 (H) 03/08/2019 0409   CHOL 239 (H) 01/12/2019 1651   TRIG 329 (H) 03/08/2019 0409   HDL 26 (L) 03/08/2019 0409   HDL 30 (L) 01/12/2019 1651   CHOLHDL 8.4 03/08/2019 0409   VLDL 66 (H) 03/08/2019 0409   LDLCALC 127 (H) 03/08/2019 0409   LDLCALC Comment 01/12/2019 1651  NM Gastric Emptying:  FINDINGS: Expected location of the stomach in the left upper quadrant. Ingested meal empties the stomach gradually over the course of the study with 95.6% retention at 60 min and 69.2% retention at 120 min (normal retention less than 30% at a 120 min).  IMPRESSION: Significant retention of radiotracer within the stomach after 2 hours. This finding is abnormal and concerning for a degree of gastric paresis.  Discharge Instructions:   Discharge Instructions    Diet - low sodium heart healthy   Complete by: As directed    Discharge instructions   Complete by: As directed    Ms. Orson Aloe, as we discussed, you have been diagnosed with gastroparesis which means that your stomach is not moving as well as it normally does as if it was paralyzed. This is most likely a complication of the the elevated glucose in your blood. As we discussed this morning, most people are able to pass food from their stomach within 30 minutes. As it took you a lot longer than this when we looked at it on a scan, we believe that the pain you are experiencing is due to the back-up of food in your stomach. The best way to manage this is by changing your diet to eat very small meals many times a day as opposed to eating larger meals 3 times a day. Along with this, if you could increase the amount of fiber in your diet, your stomach may have  an easier time digesting food. We also believe that the medication, metoclopramide, that we have been giving you before each meal will help increase the activity of your stomach. Finally, the best way to help prevent further damage to your stomach is to continue reducing the glucose in your blood as you have been doing well with thus far. We would like you to continue using your 16 units of insulin-Tresiba as well as 55m of Jardiance each day. Please follow up with you primary care provider in one week to continue the care of your symptoms.   Increase activity slowly   Complete by: As directed    Increase activity slowly   Complete by: As directed       Signed:  JTamsen Snider MD PGY1  [phone]

## 2019-03-17 ENCOUNTER — Ambulatory Visit: Payer: BLUE CROSS/BLUE SHIELD

## 2019-03-22 ENCOUNTER — Other Ambulatory Visit: Payer: Self-pay

## 2019-03-22 ENCOUNTER — Ambulatory Visit (INDEPENDENT_AMBULATORY_CARE_PROVIDER_SITE_OTHER): Payer: BLUE CROSS/BLUE SHIELD | Admitting: Internal Medicine

## 2019-03-22 ENCOUNTER — Encounter: Payer: Self-pay | Admitting: Internal Medicine

## 2019-03-22 DIAGNOSIS — Z794 Long term (current) use of insulin: Secondary | ICD-10-CM | POA: Diagnosis not present

## 2019-03-22 DIAGNOSIS — E118 Type 2 diabetes mellitus with unspecified complications: Secondary | ICD-10-CM | POA: Diagnosis not present

## 2019-03-22 DIAGNOSIS — E509 Vitamin A deficiency, unspecified: Secondary | ICD-10-CM

## 2019-03-22 DIAGNOSIS — E1143 Type 2 diabetes mellitus with diabetic autonomic (poly)neuropathy: Secondary | ICD-10-CM | POA: Diagnosis not present

## 2019-03-22 DIAGNOSIS — K3184 Gastroparesis: Secondary | ICD-10-CM

## 2019-03-22 DIAGNOSIS — E1142 Type 2 diabetes mellitus with diabetic polyneuropathy: Secondary | ICD-10-CM

## 2019-03-22 DIAGNOSIS — E1165 Type 2 diabetes mellitus with hyperglycemia: Secondary | ICD-10-CM

## 2019-03-22 MED ORDER — GABAPENTIN 300 MG PO CAPS: 600.0000 mg | ORAL_CAPSULE | Freq: Three times a day (TID) | ORAL | 0 refills | Status: DC

## 2019-03-22 MED ORDER — LISINOPRIL 40 MG PO TABS: 40.0000 mg | ORAL_TABLET | Freq: Every day | ORAL | 1 refills | Status: DC

## 2019-03-22 MED ORDER — NOVOFINE PLUS 32G X 4 MM MISC: 99 refills | Status: DC

## 2019-03-22 MED ORDER — TRESIBA FLEXTOUCH 100 UNIT/ML ~~LOC~~ SOPN: 16.0000 [IU] | PEN_INJECTOR | Freq: Every day | SUBCUTANEOUS | 1 refills | Status: DC

## 2019-03-22 MED ORDER — VITAMIN D 25 MCG (1000 UNIT) PO TABS: 1000.0000 [IU] | ORAL_TABLET | Freq: Every day | ORAL | 1 refills | Status: DC

## 2019-03-22 MED ORDER — EZETIMIBE 10 MG PO TABS: 10.0000 mg | ORAL_TABLET | Freq: Every day | ORAL | 2 refills | Status: DC

## 2019-03-22 MED ORDER — EMPAGLIFLOZIN 10 MG PO TABS: 10.0000 mg | ORAL_TABLET | Freq: Every day | ORAL | 2 refills | Status: DC

## 2019-03-22 NOTE — Patient Instructions (Addendum)
Kristen Garrett,  It was nice meeting you today. I am glad you are feeling better after your hospitalization. Remember to eat smaller meals and increase fiber in your diet to help with the gastroparesis (or slowed stomach emptying). Continue to take your diabetes medicines (Jardiance daily and 16 units of Tresiba) and work on controlling your sugars with diet and exercise. Please plan to follow-up with your primary doctor in 3 months to recheck your A1c.

## 2019-03-22 NOTE — Assessment & Plan Note (Signed)
Pt presents for hospital follow-up after admission for nausea and vomiting related to gastroparesis. States she has been "feeling wonderful" after finishing a 1 week course of metoclopramide. Denies nausea, vomiting, abdominal pain, or diarrhea/constipation. Pt says she has been eating smaller meals more frequently. Endorsing that she has been taking [place] 16 units and Jardiance 10mg  daily. She uses the [place] glucometer to check her sugars and states they are "around 200 most of the time, and closer to 250 every once in a blood moon." She was able to share her meter with our office today, but the data was only transmitted for 3 days from 9/4 to 9/6. Her A1c was >14 on 7/15.  Explained to the pt that in light of her persistently elevated A1c it would be preferable to increase her insulin or add a new medication, like an SGLT-2. Pt was not willing to consider any adjustments today because of frustration due to frequent medication changes. Discussed with the pt that she has been on her current regimen for 2 months now and her sugars are still not at goal. She would like to focus on her diet and starting to walking more with her friends before changing her regimen.   Assessment - Uncontrolled Type II Diabetes with associated gastroparesis  Plan  - continue Jardiance 10mg  daily - continue Tresiba 16 units daily (will likely need to increase at follow-up) - referral to diabetes coordinator - encourage continued gastroparesis diet with increased fiber and smaller meals

## 2019-03-22 NOTE — Progress Notes (Signed)
   CC: diabetes management and hospital follow-up  HPI:  [name] is a 58 y.o. F with significant PMH as outlined below. She was discharged from the hospital 12 days ago after an admission for nausea and vomiting related to gastroparesis. Please see problem-based charting for additional information.  Past Medical History:  Diagnosis Date  . Diabetes mellitus   . DVT of lower limb, acute ([place]) Age 60   Went on blood thinner for a few months  . Hyperlipidemia   . Hyperlipidemia   . Vertigo    Review of Systems:   Review of Systems  Constitutional: Negative for chills and fever.  Respiratory: Negative for sputum production.   Cardiovascular: Negative for chest pain.  Gastrointestinal: Negative for abdominal pain, constipation, diarrhea, heartburn, nausea and vomiting.  Endo/Heme/Allergies: Negative for polydipsia.   Physical Exam:  Vitals:   03/22/19 1033  BP: (!) 141/80  Pulse: 77  Temp: 98.2 F (36.8 C)  TempSrc: Oral  SpO2: 100%  Weight: 189 lb 1.6 oz (85.8 kg)  Height: 5' 6.5" (1.689 m)   Physical Exam Constitutional:      General: She is not in acute distress. Cardiovascular:     Rate and Rhythm: Normal rate and regular rhythm.     Heart sounds: Normal heart sounds.  Pulmonary:     Effort: Pulmonary effort is normal.     Breath sounds: Normal breath sounds.  Abdominal:     General: Abdomen is flat. Bowel sounds are normal.  Neurological:     Mental Status: She is alert.    Assessment & Plan:   See Encounters Tab for problem based charting.  Patient seen with Dr. Lynnae January

## 2019-03-23 NOTE — Progress Notes (Signed)
Internal Medicine Clinic Attending  I saw and evaluated the patient.  I personally confirmed the key portions of the history and exam documented by Dr. Ronnald Ramp and I reviewed pertinent patient test results.  The assessment, diagnosis, and plan were formulated together and I agree with the documentation in the resident's note.   She is on insulin degludec and an SGLT2.  Starting a GLP-1 had been discussed previously but would not be advised due to her recent GI symptoms.

## 2019-03-31 ENCOUNTER — Other Ambulatory Visit: Payer: Self-pay | Admitting: *Deleted

## 2019-03-31 DIAGNOSIS — E1165 Type 2 diabetes mellitus with hyperglycemia: Secondary | ICD-10-CM

## 2019-03-31 NOTE — Telephone Encounter (Signed)
Fax from pt's pharmacy-that pt's insurance prefers  [facility]-will send new rx to pharmacy.Despina Hidden Cassady10/1/20204:37 PM

## 2019-04-01 MED ORDER — INSULIN PEN NEEDLE 32G X 4 MM MISC: 99 refills | Status: DC

## 2019-04-06 ENCOUNTER — Ambulatory Visit (INDEPENDENT_AMBULATORY_CARE_PROVIDER_SITE_OTHER): Payer: BLUE CROSS/BLUE SHIELD | Admitting: Dietician

## 2019-04-06 ENCOUNTER — Other Ambulatory Visit: Payer: Self-pay

## 2019-04-06 ENCOUNTER — Encounter: Payer: Self-pay | Admitting: Dietician

## 2019-04-06 DIAGNOSIS — Z6830 Body mass index (BMI) 30.0-30.9, adult: Secondary | ICD-10-CM

## 2019-04-06 DIAGNOSIS — Z713 Dietary counseling and surveillance: Secondary | ICD-10-CM

## 2019-04-06 NOTE — Patient Instructions (Addendum)
Good job walking- keep it up!   Saturated fat (and fat total) intake should be less than 10 grams a day  read labels for saturated fat and keep it low Example- Reduce from whole milk to 1% will reduce saturated fat Drop butter- use olive, canola or peanut oil- try oven frying  Fiber increase foods with fiber as much as possible Bread- whole grain- should be more than 3 grams fiber each servings  rye, pumpernickel and sourdough  Cereal- old fashioned oats instead of instant or cornflakes- can even eat it uncooked   Eat veggies first, then protein, and healthy fats   Try this for next few weeks and we can follow  Your blood sugar and weight to see how it is working.   Kristen Garrett [phone]

## 2019-04-06 NOTE — Progress Notes (Signed)
  Medical Nutrition Therapy:  Appt start time: 3888 end time:  1118. Total time: 60 minutes Visit #1  04/06/2019 Kristen Garrett [id number]  Assessment:  Primary concerns today: primarily blood sugar control and weight.  Kristen Garrett presents for dietary counseling.  She has some misconceptions and lack of information regarding meal planning for diabetes and overall health which we discussed today.  Preferred Learning Style: Visual/ Hands on Learning Readiness: Contemplating/Ready/Change in progress  ANTHROPOMETRICS: Estimated body mass index is 30.3 kg/m as calculated from the following:   Height as of 03/22/19: 5' 6.5" (1.689 m).   Weight as of this encounter: 190 lb 9.6 oz (86.5 kg).  WEIGHT HISTORY:  Wt Readings from Last 5 Encounters:  04/06/19 190 lb 9.6 oz (86.5 kg)  03/22/19 189 lb 1.6 oz (85.8 kg)  03/08/19 179 lb 3.7 oz (81.3 kg)  02/14/19 190 lb 6.4 oz (86.4 kg)  01/12/19 194 lb 4.8 oz (88.1 kg)    SLEEP:~ 8 hours and reports good  MEDICATIONS: jardiance, tresiba daily ~ 10 AM 16 units BLOOD SUGAR: CGM Results from October 4-7 Pattern shows lowest at ~ 20-220 mg/dl from 4 AM- 10 AM Average is  270  for 3 days % time CGM is active- 19%- this is poor- 3 days Glucose management indicator unknown % Time in range (70-180 mg/dL): 2 % (Goal >70%) Time High (181-250 mg/dL) 34 % (Goal < 25%) Time Very High (>250 mg/dl) 64 % (Goal < 5%) Time Low (54-69 mg/dL): 0 % (Goal is <4%) Time Very Low (<54) 0%  (Goal <1%)  Coefficient of variation:18.1% (Goal is <36%)  DIETARY INTAKE: Usual eating pattern includes 3 meals and 1 snacks per day. Everyday foods include fried fish, salads, small portions of ships, rice, .  Avoided foods include regular soda, juice, fruit.  Denies Nausea,  Vomiting, Diarrhea or Constipation Dining Out (times/week): ~1x 24-hr recall:  B ( 9-10 AM): instant oatmeal & grilled cheese sand, coffee with evaporated whole milk  L ( 1 PM): small portion of  leftovers or [place] and cheese sandwich Snk (4 PM): chips, ding dong,  D (7-8  PM): daughters cook- (fried fish on Fridays), usually seared pork chop rice and 2 veggies, but last nigth had pasta and garlic bread Snk ( PM): cornflakes and 1% milk Beverages: diet soda or diet snapple, water, denies drinking regular soda, juice, alcohol, sports drinks  Usual physical activity: has started walking with group in past 2 weeks for 1 hours from 6-7 PM Monday through Friday  Estimated daily energy needs to maintain weight: 2000-2200 calories 180-200 g carbohydrates 90 g protein 60 g fat  Progress Towards Goal(s):  In progress.   Nutritional Diagnosis:  NB-1.1 Food and nutrition-related knowledge deficit As related to lack of sufficient meal planning education.  As evidenced by her report.    Intervention:  Nutrition education about diabetes meal planning Action Goal: lower fat/saturated fat, increase fiber, keep wlaking  Outcome goal:  Lower blood sugars Coordination of care: none  Teaching Method Utilized: Visual, Auditory,Hands on Handouts given during visit include:avs, plate method, exercise and diabetes Barriers to learning/adherence to lifestyle change: competing values Demonstrated degree of understanding via:  Teach Back   Monitoring/Evaluation:  Dietary intake, exercise, blood sugars on CGM upload, and body weight in 3 week(s). Kristen Garrett, RD 04/06/2019 2:54 PM.

## 2019-04-22 LAB — HM COLONOSCOPY

## 2019-04-27 ENCOUNTER — Ambulatory Visit: Payer: BLUE CROSS/BLUE SHIELD | Admitting: Dietician

## 2019-04-27 ENCOUNTER — Telehealth: Payer: Self-pay | Admitting: Dietician

## 2019-04-27 NOTE — Telephone Encounter (Signed)
Rescheduled her missed appointment today.

## 2019-04-29 ENCOUNTER — Ambulatory Visit: Payer: BLUE CROSS/BLUE SHIELD | Admitting: Dietician

## 2019-07-05 ENCOUNTER — Ambulatory Visit: Payer: BLUE CROSS/BLUE SHIELD

## 2019-07-06 ENCOUNTER — Ambulatory Visit (INDEPENDENT_AMBULATORY_CARE_PROVIDER_SITE_OTHER): Payer: BLUE CROSS/BLUE SHIELD | Admitting: Internal Medicine

## 2019-07-06 ENCOUNTER — Other Ambulatory Visit: Payer: Self-pay

## 2019-07-06 ENCOUNTER — Encounter: Payer: Self-pay | Admitting: Internal Medicine

## 2019-07-06 VITALS — BP 148/74 | HR 79 | Temp 98.4°F | Wt 193.2 lb

## 2019-07-06 DIAGNOSIS — I10 Essential (primary) hypertension: Secondary | ICD-10-CM | POA: Diagnosis not present

## 2019-07-06 DIAGNOSIS — Z0000 Encounter for general adult medical examination without abnormal findings: Secondary | ICD-10-CM

## 2019-07-06 DIAGNOSIS — Z794 Long term (current) use of insulin: Secondary | ICD-10-CM

## 2019-07-06 DIAGNOSIS — E1165 Type 2 diabetes mellitus with hyperglycemia: Secondary | ICD-10-CM | POA: Diagnosis not present

## 2019-07-06 DIAGNOSIS — E049 Nontoxic goiter, unspecified: Secondary | ICD-10-CM

## 2019-07-06 DIAGNOSIS — E118 Type 2 diabetes mellitus with unspecified complications: Secondary | ICD-10-CM

## 2019-07-06 DIAGNOSIS — R748 Abnormal levels of other serum enzymes: Secondary | ICD-10-CM

## 2019-07-06 DIAGNOSIS — E785 Hyperlipidemia, unspecified: Secondary | ICD-10-CM

## 2019-07-06 DIAGNOSIS — Z79899 Other long term (current) drug therapy: Secondary | ICD-10-CM

## 2019-07-06 DIAGNOSIS — Z1231 Encounter for screening mammogram for malignant neoplasm of breast: Secondary | ICD-10-CM

## 2019-07-06 DIAGNOSIS — E1142 Type 2 diabetes mellitus with diabetic polyneuropathy: Secondary | ICD-10-CM

## 2019-07-06 DIAGNOSIS — Z720 Tobacco use: Secondary | ICD-10-CM

## 2019-07-06 LAB — POCT GLYCOSYLATED HEMOGLOBIN (HGB A1C): Hemoglobin A1C: 9.6 % — AB (ref 4.0–5.6)

## 2019-07-06 LAB — GLUCOSE, CAPILLARY: Glucose-Capillary: 191 mg/dL — ABNORMAL HIGH (ref 70–99)

## 2019-07-06 MED ORDER — LISINOPRIL 40 MG PO TABS: 40.0000 mg | ORAL_TABLET | Freq: Every day | ORAL | 1 refills | Status: DC

## 2019-07-06 MED ORDER — GABAPENTIN 300 MG PO CAPS: 600.0000 mg | ORAL_CAPSULE | Freq: Three times a day (TID) | ORAL | 0 refills | Status: DC

## 2019-07-06 MED ORDER — ATORVASTATIN CALCIUM 40 MG PO TABS: 40.0000 mg | ORAL_TABLET | Freq: Every day | ORAL | 11 refills | Status: DC

## 2019-07-06 MED ORDER — TRESIBA FLEXTOUCH 100 UNIT/ML ~~LOC~~ SOPN: 16.0000 [IU] | PEN_INJECTOR | Freq: Every day | SUBCUTANEOUS | 1 refills | Status: DC

## 2019-07-06 MED ORDER — EMPAGLIFLOZIN 10 MG PO TABS: 10.0000 mg | ORAL_TABLET | Freq: Every day | ORAL | 2 refills | Status: DC

## 2019-07-06 MED ORDER — ATORVASTATIN CALCIUM 40 MG PO TABS: 40.0000 mg | ORAL_TABLET | Freq: Every day | ORAL | 3 refills | Status: DC

## 2019-07-06 NOTE — Assessment & Plan Note (Signed)
BP Readings from Last 3 Encounters:  07/06/19 (!) 148/74  03/22/19 (!) 141/80  03/10/19 139/80  Current medications include lisinopril-HCTZ 40-25 mg qd, but she converted back to lisinopril 40 mg as HCTZ was making her dehydrated. Blood pressure today mildly elevated. She does not want any bp medication changes today.   - continue lisinopril 40 mg qd - bmp today

## 2019-07-06 NOTE — Assessment & Plan Note (Signed)
Last A1c >14. A1c today 9.6. She is currently taking jardiance 10 mg qd and tresiba 16U qd. It was previously recommended to start SGLT-2 or to increase insulin, but she preferred to focus on diet management. She uses free libre glucometer to check glucose, which has been within range 84% of the time. Discussed switching to SGLT-2 or to increase tresiba as she is still uncontrolled, but she would like to continue her current medications and working on her diet. I reiterated that sometimes increased medications are required despite dietary changes, in which she has made a lot of improvement, but she would like to wait.  She also has stopped zetia, which she said gave her a headache and made her feel off but is open to continuing lipitor.   - start atorvastatin 40 mg qd - cont. Current medications  - f/u three months for a1c check.  - refilled gabapentin

## 2019-07-06 NOTE — Progress Notes (Signed)
   CC: Type II Diabetes  HPI:  Ms.Eliabeth Hamid is a 59 y.o. with PMH as below.   Please see A&P for assessment of the patient's acute and chronic medical conditions.   Past Medical History:  Diagnosis Date  . Diabetes mellitus   . DVT of lower limb, acute (HCC) Age 67   Went on blood thinner for a few months  . Hyperlipidemia   . Hyperlipidemia   . Vertigo    Review of Systems:   Review of Systems  Constitutional: Negative for diaphoresis, malaise/fatigue and weight loss.  HENT:       No dysphagia or feelings of obstruction  Eyes: Negative for blurred vision.  Cardiovascular: Negative for palpitations and leg swelling.  Gastrointestinal: Negative for abdominal pain, constipation and diarrhea.  Genitourinary: Negative for frequency.  Neurological: Negative for dizziness and tingling.   Physical Exam:   Constitution: NAD, appears stated age HENT: enlarged thyroid, negative pemberton's Cardio: RRR, no m/r/g, no LE edema  Respiratory: CTA, no w/r/rextremities Neuro: normal affect, a&ox3 Skin: c/d/i    Vitals:   07/06/19 0919  BP: (!) 148/74  Pulse: 79  Temp: 98.4 F (36.9 C)  TempSrc: Oral  SpO2: 100%  Weight: 193 lb 3.2 oz (87.6 kg)    Assessment & Plan:   See Encounters Tab for problem based charting.  Patient discussed with Dr. Oswaldo Done

## 2019-07-06 NOTE — Assessment & Plan Note (Signed)
She is interested in smoking cessation. She states nicorette and the patches did not work for her, but she is interested in more information on chantix. Unfortunately, she left before I was able to provide this.   - Give information on chantix at follow-up.

## 2019-07-06 NOTE — Patient Instructions (Signed)
Thank you for allowing [place] to provide your care today. Today we discussed your enlarged thyroid, hypertension, and type II diabetes  I have ordered the following labs for you:  Complete metabolic panel, TSH, and hemoglobin a1c   I will call if any are abnormal.    Today we made the following changes to your medications:   Please START taking  Lipitor 40 mg tablet once per day   Please STOP taking   Please follow-up in three months for hemoglobin a1c and blood pressure check.  I have placed an order for a thyroid ultrasound. You will be contacted to schedule an appointment.     Please call the internal medicine center clinic if you have any questions or concerns, we may be able to help and keep you from a long and expensive emergency room wait. Our clinic and after hours phone number is [phone], the best time to call is Monday through Friday 9 am to 4 pm but there is always someone available 24/7 if you have an emergency. If you need medication refills please notify your pharmacy one week in advance and they will send [place] a request.

## 2019-07-06 NOTE — Assessment & Plan Note (Signed)
Chronically enlarged thyroid. This has been present since around 2005. She had a previous bx in 2006 which showed rare follicular cells but no malignancy. She states it has not changed in size since that time. No symptoms of hyper- or hypothyroidism or obstruction.   - repeat thyroid US - ATA recommends repeat US every 3-5 years  - TSH per patient request

## 2019-07-06 NOTE — Assessment & Plan Note (Signed)
Had colonoscopy in October, non-cancerous polyps and will repeat colonoscopy this year.   - mammogram referral placed

## 2019-07-07 LAB — BMP8+ANION GAP
Anion Gap: 14 mmol/L (ref 10.0–18.0)
BUN/Creatinine Ratio: 21 (ref 9–23)
BUN: 19 mg/dL (ref 6–24)
CO2: 21 mmol/L (ref 20–29)
Calcium: 9.4 mg/dL (ref 8.7–10.2)
Chloride: 102 mmol/L (ref 96–106)
Creatinine, Ser: 0.9 mg/dL (ref 0.57–1.00)
GFR calc Af Amer: 83 mL/min/{1.73_m2} (ref >59)
GFR calc non Af Amer: 72 mL/min/{1.73_m2} (ref >59)
Glucose: 205 mg/dL — ABNORMAL HIGH (ref 65–99)
Potassium: 4.9 mmol/L (ref 3.5–5.2)
Sodium: 137 mmol/L (ref 134–144)

## 2019-07-07 LAB — HEPATIC FUNCTION PANEL
ALT: 36 IU/L — ABNORMAL HIGH (ref 0–32)
AST: 23 IU/L (ref 0–40)
Albumin: 4 g/dL (ref 3.8–4.9)
Alkaline Phosphatase: 168 IU/L — ABNORMAL HIGH (ref 39–117)
Bilirubin Total: 0.2 mg/dL (ref 0.0–1.2)
Bilirubin, Direct: 0.09 mg/dL (ref 0.00–0.40)
Total Protein: 6.6 g/dL (ref 6.0–8.5)

## 2019-07-07 LAB — TSH: TSH: 1.12 u[IU]/mL (ref 0.450–4.500)

## 2019-07-07 NOTE — Progress Notes (Signed)
Internal Medicine Clinic Attending  Case discussed with Dr. Seawell at the time of the visit.  We reviewed the resident's history and exam and pertinent patient test results.  I agree with the assessment, diagnosis, and plan of care documented in the resident's note.    

## 2019-07-11 NOTE — Assessment & Plan Note (Addendum)
Addendum: alkaline phosphatase continues to be mildly elevated, discussed results with patient. She is taking her vitamin D supplements.   - will need to fast at follow-up appointment and repeat alk phos, get GGT to differentiate bone vs. Liver source although CT in September without acute findings on liver.

## 2019-07-13 ENCOUNTER — Ambulatory Visit (HOSPITAL_COMMUNITY): Payer: BLUE CROSS/BLUE SHIELD

## 2019-08-04 ENCOUNTER — Other Ambulatory Visit: Payer: Self-pay

## 2019-08-04 DIAGNOSIS — I10 Essential (primary) hypertension: Secondary | ICD-10-CM

## 2019-08-04 MED ORDER — LISINOPRIL 40 MG PO TABS: 40.0000 mg | ORAL_TABLET | Freq: Every day | ORAL | 0 refills | Status: DC

## 2019-10-06 ENCOUNTER — Ambulatory Visit: Payer: BLUE CROSS/BLUE SHIELD | Attending: Internal Medicine

## 2019-10-06 DIAGNOSIS — Z23 Encounter for immunization: Secondary | ICD-10-CM

## 2019-10-06 NOTE — Progress Notes (Signed)
   Covid-19 Vaccination Clinic  Name:  Kristen Garrett    MRN: [id number] DOB: March 27, 1961  10/06/2019  Ms. [name] was observed post Covid-19 immunization for 15 minutes without incident. She was provided with Vaccine Information Sheet and instruction to access the V-Safe system.   Ms. [name] was instructed to call 911 with any severe reactions post vaccine: Marland Kitchen Difficulty breathing  . Swelling of face and throat  . A fast heartbeat  . A bad rash all over body  . Dizziness and weakness   Immunizations Administered    Name Date Dose VIS Date Route   Pfizer COVID-19 Vaccine 10/06/2019 10:07 AM 0.3 mL 06/10/2019 Intramuscular   Manufacturer: [employer], [employer]   Lot: [id number]   NDC: [address]-5

## 2019-10-31 ENCOUNTER — Ambulatory Visit: Payer: BLUE CROSS/BLUE SHIELD | Attending: Internal Medicine

## 2019-10-31 DIAGNOSIS — Z23 Encounter for immunization: Secondary | ICD-10-CM

## 2019-10-31 NOTE — Progress Notes (Signed)
   Covid-19 Vaccination Clinic  Name:  Kristen Garrett    MRN: [id number] DOB: 1961-04-07  10/31/2019  Kristen Garrett was observed post Covid-19 immunization for 15 minutes without incident. She was provided with Vaccine Information Sheet and instruction to access the V-Safe system.   Kristen Garrett was instructed to call 911 with any severe reactions post vaccine: Marland Kitchen Difficulty breathing  . Swelling of face and throat  . A fast heartbeat  . A bad rash all over body  . Dizziness and weakness   Immunizations Administered    Name Date Dose VIS Date Route   Pfizer COVID-19 Vaccine 10/31/2019 10:29 AM 0.3 mL 08/24/2018 Intramuscular   Manufacturer: [employer], [employer]   Lot: [id number]   NDC: [address]-6

## 2019-11-07 ENCOUNTER — Other Ambulatory Visit: Payer: Self-pay | Admitting: Internal Medicine

## 2019-11-07 DIAGNOSIS — I10 Essential (primary) hypertension: Secondary | ICD-10-CM

## 2019-11-07 NOTE — Telephone Encounter (Signed)
Pt is calling regarding medicine  [phone]

## 2019-12-12 ENCOUNTER — Encounter: Payer: BLUE CROSS/BLUE SHIELD | Admitting: Internal Medicine

## 2019-12-12 NOTE — Progress Notes (Deleted)
   CC: ***  HPI:  Ms.Karrin Manzer is a 59 y.o. with the history listed below presenting for follow-up of her diabetes, hypertension, and hyperlipidemia.  Patient is currently on lisinopril 40 mg daily.  Blood pressure generally***.  She denies any issues taking medications.  Patient is currently in [place]***.  She is also on atorvastatin daily.  CBGs have been***.  Past Medical History:  Diagnosis Date  . Diabetes mellitus   . DVT of lower limb, acute (HCC) Age 89   Went on blood thinner for a few months  . Hyperlipidemia   . Hyperlipidemia   . Vertigo    Review of Systems:  ***  Physical Exam:  There were no vitals filed for this visit. ***  Assessment & Plan:   See Encounters Tab for problem based charting.  Patient discussed with Dr. {NAMES:[id number]::"Butcher","Guilloud","Hoffman","Mullen","Narendra","Raines","Vincent"}

## 2020-02-28 ENCOUNTER — Encounter: Payer: BLUE CROSS/BLUE SHIELD | Admitting: Internal Medicine

## 2020-03-12 ENCOUNTER — Other Ambulatory Visit: Payer: Self-pay | Admitting: Internal Medicine

## 2020-03-12 DIAGNOSIS — E1165 Type 2 diabetes mellitus with hyperglycemia: Secondary | ICD-10-CM

## 2020-04-05 ENCOUNTER — Other Ambulatory Visit: Payer: Self-pay

## 2020-04-05 ENCOUNTER — Encounter: Payer: Self-pay | Admitting: Internal Medicine

## 2020-04-05 ENCOUNTER — Ambulatory Visit (INDEPENDENT_AMBULATORY_CARE_PROVIDER_SITE_OTHER): Payer: BLUE CROSS/BLUE SHIELD | Admitting: Internal Medicine

## 2020-04-05 VITALS — BP 152/81 | HR 76 | Wt 197.4 lb

## 2020-04-05 DIAGNOSIS — Z794 Long term (current) use of insulin: Secondary | ICD-10-CM

## 2020-04-05 DIAGNOSIS — R748 Abnormal levels of other serum enzymes: Secondary | ICD-10-CM | POA: Diagnosis not present

## 2020-04-05 DIAGNOSIS — E049 Nontoxic goiter, unspecified: Secondary | ICD-10-CM | POA: Diagnosis not present

## 2020-04-05 DIAGNOSIS — I10 Essential (primary) hypertension: Secondary | ICD-10-CM | POA: Diagnosis not present

## 2020-04-05 DIAGNOSIS — E1165 Type 2 diabetes mellitus with hyperglycemia: Secondary | ICD-10-CM

## 2020-04-05 DIAGNOSIS — E785 Hyperlipidemia, unspecified: Secondary | ICD-10-CM

## 2020-04-05 DIAGNOSIS — E1142 Type 2 diabetes mellitus with diabetic polyneuropathy: Secondary | ICD-10-CM

## 2020-04-05 DIAGNOSIS — IMO0002 Reserved for concepts with insufficient information to code with codable children: Secondary | ICD-10-CM

## 2020-04-05 DIAGNOSIS — E509 Vitamin A deficiency, unspecified: Secondary | ICD-10-CM

## 2020-04-05 DIAGNOSIS — R1010 Upper abdominal pain, unspecified: Secondary | ICD-10-CM

## 2020-04-05 LAB — POCT GLYCOSYLATED HEMOGLOBIN (HGB A1C): Hemoglobin A1C: 11.1 % — AB (ref 4.0–5.6)

## 2020-04-05 LAB — GLUCOSE, CAPILLARY: Glucose-Capillary: 192 mg/dL — ABNORMAL HIGH (ref 70–99)

## 2020-04-05 MED ORDER — ASPIRIN 81 MG PO CHEW: 81.0000 mg | CHEWABLE_TABLET | Freq: Every day | ORAL | 2 refills | Status: AC

## 2020-04-05 MED ORDER — VITAMIN D 25 MCG (1000 UNIT) PO TABS: 1000.0000 [IU] | ORAL_TABLET | Freq: Every day | ORAL | 1 refills | Status: DC

## 2020-04-05 MED ORDER — ATORVASTATIN CALCIUM 40 MG PO TABS: 40.0000 mg | ORAL_TABLET | Freq: Every day | ORAL | 3 refills | Status: DC

## 2020-04-05 MED ORDER — INSULIN PEN NEEDLE 32G X 4 MM MISC
99 refills | Status: DC
Start: 2020-04-05 — End: 2023-06-18

## 2020-04-05 MED ORDER — GABAPENTIN 300 MG PO CAPS: 600.0000 mg | ORAL_CAPSULE | Freq: Three times a day (TID) | ORAL | 0 refills | Status: DC

## 2020-04-05 MED ORDER — FREESTYLE LIBRE 14 DAY SENSOR MISC: 0 refills | Status: DC

## 2020-04-05 MED ORDER — TRESIBA FLEXTOUCH 100 UNIT/ML ~~LOC~~ SOPN: 20.0000 [IU] | PEN_INJECTOR | Freq: Every day | SUBCUTANEOUS | 2 refills | Status: DC

## 2020-04-05 MED ORDER — EMPAGLIFLOZIN 10 MG PO TABS: 10.0000 mg | ORAL_TABLET | Freq: Every day | ORAL | 2 refills | Status: DC

## 2020-04-05 MED ORDER — METOCLOPRAMIDE HCL 5 MG PO TABS: 5.0000 mg | ORAL_TABLET | Freq: Three times a day (TID) | ORAL | 0 refills | Status: DC

## 2020-04-05 MED ORDER — LISINOPRIL 40 MG PO TABS: 40.0000 mg | ORAL_TABLET | Freq: Every day | ORAL | 0 refills | Status: DC

## 2020-04-05 MED ORDER — METOCLOPRAMIDE HCL 5 MG PO TABS: 5.0000 mg | ORAL_TABLET | Freq: Three times a day (TID) | ORAL | 2 refills | Status: DC

## 2020-04-05 NOTE — Patient Instructions (Signed)
Kristen Garrett, It was nice meeting you.   Today we discussed your diabetes:  We will see what the A1C shows, but I agree with your plan to increase Tresiba to 20 units. We can discuss other changes at your next visit if we need to.   I have sent refills in on your other medications.   For your stomach pain, try taking the Reglan for 2-4 weeks before meals to see if this helps.   Take care!

## 2020-04-06 ENCOUNTER — Encounter: Payer: Self-pay | Admitting: Internal Medicine

## 2020-04-06 LAB — CMP14 + ANION GAP
ALT: 19 IU/L (ref 0–32)
AST: 16 IU/L (ref 0–40)
Albumin/Globulin Ratio: 1.4 (ref 1.2–2.2)
Albumin: 4.2 g/dL (ref 3.8–4.9)
Alkaline Phosphatase: 153 IU/L — ABNORMAL HIGH (ref 44–121)
Anion Gap: 15 mmol/L (ref 10.0–18.0)
BUN/Creatinine Ratio: 20 (ref 9–23)
BUN: 20 mg/dL (ref 6–24)
Bilirubin Total: 0.2 mg/dL (ref 0.0–1.2)
CO2: 20 mmol/L (ref 20–29)
Calcium: 9.5 mg/dL (ref 8.7–10.2)
Chloride: 101 mmol/L (ref 96–106)
Creatinine, Ser: 0.98 mg/dL (ref 0.57–1.00)
GFR calc Af Amer: 74 mL/min/{1.73_m2} (ref >59)
GFR calc non Af Amer: 64 mL/min/{1.73_m2} (ref >59)
Globulin, Total: 2.9 g/dL (ref 1.5–4.5)
Glucose: 190 mg/dL — ABNORMAL HIGH (ref 65–99)
Potassium: 5 mmol/L (ref 3.5–5.2)
Sodium: 136 mmol/L (ref 134–144)
Total Protein: 7.1 g/dL (ref 6.0–8.5)

## 2020-04-06 LAB — GAMMA GT: GGT: 76 IU/L — ABNORMAL HIGH (ref 0–60)

## 2020-04-06 LAB — TSH: TSH: 0.618 u[IU]/mL (ref 0.450–4.500)

## 2020-04-06 NOTE — Assessment & Plan Note (Signed)
Alk phos persistently mildly elevated, along with GGT. Will proceed with next step in work-up which would be RUQ ultrasound.

## 2020-04-06 NOTE — Assessment & Plan Note (Signed)
TSH checked per patient request which is within normal limits.

## 2020-04-06 NOTE — Assessment & Plan Note (Addendum)
Patient notes post-prandial bloating. Given her uncontrolled diabetes, symptoms are most consistent with gastroparesis. Will do short course of Reglan to see if symptoms improve. Emphasized importance of glycemic control.

## 2020-04-06 NOTE — Assessment & Plan Note (Signed)
A1C increased from 9.6 > 11.1 today. As mentioned above, she has been out of medications for a little over a week. Additionally, she states she started eating poorly after she became discouraged following her last appointment. She realizes she needs to get back on track and is motivated to go back to better diet habits moving forward.  She self-titrated her Tresiba from 16 to 20 units daily which I told her was a good next step. Will continue Jardiance 10 mg daily.  Follow-up in 3 months for repeat A1C and additional medication management as indicated.

## 2020-04-06 NOTE — Assessment & Plan Note (Signed)
Patient's blood pressure elevated today, but she has been out of her medications for over a week. Refill on Lisinopril 40 mg sent today. Will not make any changes at this time. As noted previously, she did not tolerate combo pill with HCTZ. Consider addition of CCB if remains uncontrolled at follow-up visit.

## 2020-04-06 NOTE — Progress Notes (Signed)
Acute Office Visit  Subjective:    Patient ID: Kristen Garrett, female    DOB: 08/20/1960, 59 y.o.   MRN: [id number]  Chief Complaint  Patient presents with  . Diabetes  . Medication Refill    HPI Patient is in today for follow-up on chronic Diabetes and HTN, abdominal pain. Please see problem based charting for further details.   Past Medical History:  Diagnosis Date  . Diabetes mellitus   . DVT of lower limb, acute ([place]) Age 68   Went on blood thinner for a few months  . Hyperlipidemia   . Hyperlipidemia   . Vertigo     Past Surgical History:  Procedure Laterality Date  . ENDOMETRIAL ABLATION W/ NOVASURE    . TUBAL LIGATION      Family History  Problem Relation Age of Onset  . Diabetes Mother   . Hypertension Mother   . Hyperlipidemia Mother   . Diabetes Father   . Hyperlipidemia Father   . Diabetes Brother     Social History   Socioeconomic History  . Marital status: Married    Spouse name: Not on file  . Number of children: Not on file  . Years of education: Not on file  . Highest education level: Not on file  Occupational History  . Occupation: Unemployed   Tobacco Use  . Smoking status: Current Every Day Smoker    Packs/day: 0.50    Years: 40.00    Pack years: 20.00    Types: Cigarettes  . Smokeless tobacco: Never Used  . Tobacco comment: cutting back  Substance and Sexual Activity  . Alcohol use: No    Alcohol/week: 0.0 standard drinks  . Drug use: No  . Sexual activity: Yes  Other Topics Concern  . 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 Lives in [place] with her husband.    2 grown daughters.       Social Determinants of Health   Financial Resource Strain:   . Difficulty of Paying Living Expenses: Not on file  Food Insecurity:   . Worried About Charity fundraiser in the Last Year: Not on file  . Ran Out of Food in the Last Year: Not on file  Transportation Needs:   . Lack of  Transportation (Medical): Not on file  . Lack of Transportation (Non-Medical): Not on file  Physical Activity:   . Days of Exercise per Week: Not on file  . Minutes of Exercise per Session: Not on file  Stress:   . Feeling of Stress : Not on file  Social Connections:   . Frequency of Communication with Friends and Family: Not on file  . Frequency of Social Gatherings with Friends and Family: Not on file  . Attends Religious Services: Not on file  . Active Member of Clubs or Organizations: Not on file  . Attends Archivist Meetings: Not on file  . Marital Status: Not on file  Intimate Partner Violence:   . Fear of Current or Ex-Partner: Not on file  . Emotionally Abused: Not on file  . Physically Abused: Not on file  . Sexually Abused: Not on file    Outpatient Medications Prior to Visit  Medication Sig Dispense Refill  . acetaminophen (TYLENOL) 325 MG tablet Take 2 tablets (650 mg total) by mouth every 6 (six) hours as needed for moderate pain. 30 tablet 0  . Blood Glucose Monitoring Suppl ([place])  w/Device KIT 1 Device by Does not apply route daily. 1 kit 0  . Continuous Blood Gluc Receiver (DEXCOM G6 RECEIVER) DEVI 1 each by Does not apply route 6 (six) times daily. Dx code: E11.42; she is insulin requiring. 1 each 0  . Continuous Blood Gluc Sensor (DEXCOM G6 SENSOR) MISC 1 each by Does not apply route 6 (six) times daily. Dx code: E11.42; she is insulin requiring. 3 each 12  . Continuous Blood Gluc Transmit (DEXCOM G6 TRANSMITTER) MISC 1 each by Does not apply route 6 (six) times daily. Dx code: E11.42; she is insulin requiring. 1 each 4  . glucose blood (RELION GLUCOSE TEST STRIPS) test strip Use as instructed 100 each 12  . Lancets Misc. (RELION LANCING DEVICE) KIT 1 Device by Does not apply route daily. 1 kit 0  . Multiple Vitamin (MULTIVITAMIN WITH MINERALS) TABS tablet Take 1 tablet by mouth daily.    Marland Kitchen aspirin 81 MG chewable tablet Chew 1 tablet  (81 mg total) by mouth daily. 90 tablet 2  . atorvastatin (LIPITOR) 40 MG tablet Take 1 tablet (40 mg total) by mouth daily. 90 tablet 3  . cholecalciferol (VITAMIN D3) 25 MCG (1000 UT) tablet Take 1 tablet (1,000 Units total) by mouth daily. 90 tablet 1  . Continuous Blood Gluc Sensor (FREESTYLE LIBRE 14 DAY SENSOR) MISC USE AS DIRECTED TO CHECK BLOOD SUGAR FOUR TIMES DAILY. 6 each 0  . empagliflozin (JARDIANCE) 10 MG TABS tablet Take 10 mg by mouth daily. 90 tablet 2  . gabapentin (NEURONTIN) 300 MG capsule Take 2 capsules (600 mg total) by mouth 3 (three) times daily. 540 capsule 0  . insulin degludec (TRESIBA FLEXTOUCH) 100 UNIT/ML SOPN FlexTouch Pen Inject 0.16 mLs (16 Units total) into the skin daily. 15 mL 1  . Insulin Pen Needle 32G X 4 MM MISC Use to inject insulin one time daily. Dx E11.65 (Prefers BD Pen Needles) 100 each prn  . lisinopril (ZESTRIL) 40 MG tablet Take 1 tablet by mouth once daily 90 tablet 0  . metoCLOPramide (REGLAN) 5 MG tablet Take 1 tablet (5 mg total) by mouth 3 (three) times daily before meals for 7 days. 21 tablet 0   No facility-administered medications prior to visit.    Allergies  Allergen Reactions  . Glipizide Diarrhea  . Metformin And Related Diarrhea    Review of Systems  Constitutional: Negative for chills, fever and unexpected weight change.  Eyes: Negative for visual disturbance.  Respiratory: Negative for cough and shortness of breath.   Cardiovascular: Negative for chest pain.  Gastrointestinal: Positive for abdominal pain and nausea. Negative for blood in stool and vomiting.  Genitourinary: Negative for dysuria.  Neurological: Negative for syncope, weakness, light-headedness and headaches.       Objective:    Physical Exam Constitutional:      General: She is not in acute distress.    Appearance: Normal appearance.  Cardiovascular:     Rate and Rhythm: Normal rate and regular rhythm.  Pulmonary:     Effort: Pulmonary effort is  normal.     Breath sounds: Normal breath sounds.  Abdominal:     General: There is no distension.     Palpations: Abdomen is soft.     Tenderness: There is no abdominal tenderness.  Musculoskeletal:     Right lower leg: No edema.     Left lower leg: No edema.  Neurological:     General: No focal deficit present.     Mental  Status: She is alert.  Psychiatric:        Mood and Affect: Mood normal.        Behavior: Behavior normal.     BP (!) 152/81 (BP Location: Left Arm, Patient Position: Sitting, Cuff Size: Normal)   Pulse 76   Wt 197 lb 6.4 oz (89.5 kg)   LMP 02/17/2015   SpO2 100%   BMI 31.38 kg/m  Wt Readings from Last 3 Encounters:  04/05/20 197 lb 6.4 oz (89.5 kg)  07/06/19 193 lb 3.2 oz (87.6 kg)  04/06/19 190 lb 9.6 oz (86.5 kg)    Health Maintenance Due  Topic Date Due  . Hepatitis C Screening  Never done  . COLONOSCOPY  Never done  . MAMMOGRAM  06/16/2014  . OPHTHALMOLOGY EXAM  08/10/2014    There are no preventive care reminders to display for this patient.   Lab Results  Component Value Date   TSH 0.618 04/05/2020   Lab Results  Component Value Date   WBC 9.6 03/08/2019   HGB 12.6 03/08/2019   HCT 40.5 03/08/2019   MCV 79.1 (L) 03/08/2019   PLT 240 03/08/2019   Lab Results  Component Value Date   NA 136 04/05/2020   K 5.0 04/05/2020   CO2 20 04/05/2020   GLUCOSE 190 (H) 04/05/2020   BUN 20 04/05/2020   CREATININE 0.98 04/05/2020   BILITOT 0.2 04/05/2020   ALKPHOS 153 (H) 04/05/2020   AST 16 04/05/2020   ALT 19 04/05/2020   PROT 7.1 04/05/2020   ALBUMIN 4.2 04/05/2020   CALCIUM 9.5 04/05/2020   ANIONGAP 8 03/08/2019   Lab Results  Component Value Date   CHOL 219 (H) 03/08/2019   Lab Results  Component Value Date   HDL 26 (L) 03/08/2019   Lab Results  Component Value Date   LDLCALC 127 (H) 03/08/2019   Lab Results  Component Value Date   TRIG 329 (H) 03/08/2019   Lab Results  Component Value Date   CHOLHDL 8.4 03/08/2019    Lab Results  Component Value Date   HGBA1C 11.1 (A) 04/05/2020       Assessment & Plan:   Problem List Items Addressed This Visit      Cardiovascular and Mediastinum   Essential hypertension    Patient's blood pressure elevated today, but she has been out of her medications for over a week. Refill on Lisinopril 40 mg sent today. Will not make any changes at this time. As noted previously, she did not tolerate combo pill with HCTZ. Consider addition of CCB if remains uncontrolled at follow-up visit.       Relevant Medications   lisinopril (ZESTRIL) 40 MG tablet   aspirin 81 MG chewable tablet   atorvastatin (LIPITOR) 40 MG tablet   Other Relevant Orders   CMP14 + Anion Gap (Completed)     Endocrine   DM (diabetes mellitus), type 2, uncontrolled (HCC) - Primary    A1C increased from 9.6 > 11.1 today. As mentioned above, she has been out of medications for a little over a week. Additionally, she states she started eating poorly after she became discouraged following her last appointment. She realizes she needs to get back on track and is motivated to go back to better diet habits moving forward.  She self-titrated her Tresiba from 16 to 20 units daily which I told her was a good next step. Will continue Jardiance 10 mg daily.  Follow-up in 3 months for repeat A1C and  additional medication management as indicated.       Relevant Medications   lisinopril (ZESTRIL) 40 MG tablet   insulin degludec (TRESIBA FLEXTOUCH) 100 UNIT/ML FlexTouch Pen   Continuous Blood Gluc Sensor (FREESTYLE LIBRE 14 DAY SENSOR) MISC   aspirin 81 MG chewable tablet   atorvastatin (LIPITOR) 40 MG tablet   empagliflozin (JARDIANCE) 10 MG TABS tablet   Other Relevant Orders   POC Hbg A1C (Completed)   Enlarged thyroid    TSH checked per patient request which is within normal limits.       Relevant Orders   TSH (Completed)   Diabetic peripheral neuropathy (HCC)   Relevant Medications   lisinopril  (ZESTRIL) 40 MG tablet   insulin degludec (TRESIBA FLEXTOUCH) 100 UNIT/ML FlexTouch Pen   gabapentin (NEURONTIN) 300 MG capsule   aspirin 81 MG chewable tablet   atorvastatin (LIPITOR) 40 MG tablet   empagliflozin (JARDIANCE) 10 MG TABS tablet     Other   Hyperlipidemia   Relevant Medications   lisinopril (ZESTRIL) 40 MG tablet   aspirin 81 MG chewable tablet   atorvastatin (LIPITOR) 40 MG tablet   Elevated alkaline phosphatase level    Alk phos persistently mildly elevated, along with GGT. Will proceed with next step in work-up which would be RUQ ultrasound.       Relevant Orders   Gamma GT, GGT (91478) (Completed)   US Abdomen Limited RUQ   Pain of upper abdomen    Patient notes post-prandial bloating. Given her uncontrolled diabetes, symptoms are most consistent with gastroparesis. Will do short course of Reglan to see if symptoms improve. Emphasized importance of glycemic control.        Other Visit Diagnoses    Uncontrolled type 2 diabetes mellitus with diabetic polyneuropathy, with long-term current use of insulin (HCC)  (Chronic)      Relevant Medications   lisinopril (ZESTRIL) 40 MG tablet   insulin degludec (TRESIBA FLEXTOUCH) 100 UNIT/ML FlexTouch Pen   gabapentin (NEURONTIN) 300 MG capsule   aspirin 81 MG chewable tablet   atorvastatin (LIPITOR) 40 MG tablet   empagliflozin (JARDIANCE) 10 MG TABS tablet   Vitamin A deficiency       Relevant Medications   cholecalciferol (VITAMIN D3) 25 MCG (1000 UNIT) tablet       Meds ordered this encounter  Medications  . DISCONTD: metoCLOPramide (REGLAN) 5 MG tablet    Sig: Take 1 tablet (5 mg total) by mouth 3 (three) times daily before meals.    Dispense:  90 tablet    Refill:  2  . lisinopril (ZESTRIL) 40 MG tablet    Sig: Take 1 tablet (40 mg total) by mouth daily.    Dispense:  90 tablet    Refill:  0  . insulin degludec (TRESIBA FLEXTOUCH) 100 UNIT/ML FlexTouch Pen    Sig: Inject 20 Units into the skin daily.     Dispense:  15 mL    Refill:  2  . gabapentin (NEURONTIN) 300 MG capsule    Sig: Take 2 capsules (600 mg total) by mouth 3 (three) times daily.    Dispense:  540 capsule    Refill:  0  . Continuous Blood Gluc Sensor (FREESTYLE LIBRE 14 DAY SENSOR) MISC    Sig: USE AS DIRECTED TO CHECK BLOOD SUGAR FOUR TIMES DAILY.    Dispense:  6 each    Refill:  0  . aspirin 81 MG chewable tablet    Sig: Chew 1 tablet (81 mg total)  by mouth daily.    Dispense:  90 tablet    Refill:  2  . atorvastatin (LIPITOR) 40 MG tablet    Sig: Take 1 tablet (40 mg total) by mouth daily.    Dispense:  90 tablet    Refill:  3    Cancel previous lipitor order  . cholecalciferol (VITAMIN D3) 25 MCG (1000 UNIT) tablet    Sig: Take 1 tablet (1,000 Units total) by mouth daily.    Dispense:  90 tablet    Refill:  1  . empagliflozin (JARDIANCE) 10 MG TABS tablet    Sig: Take 1 tablet (10 mg total) by mouth daily.    Dispense:  90 tablet    Refill:  2  . Insulin Pen Needle 32G X 4 MM MISC    Sig: Use to inject insulin one time daily. Dx E11.65 (Prefers BD Pen Needles)    Dispense:  100 each    Refill:  prn    BD Pen Needles  . metoCLOPramide (REGLAN) 5 MG tablet    Sig: Take 1 tablet (5 mg total) by mouth 3 (three) times daily before meals.    Dispense:  90 tablet    Refill:  0     Malacki Mcphearson D Kellsey Sansone, DO

## 2020-04-10 NOTE — Progress Notes (Signed)
Internal Medicine Clinic Attending  Case discussed with Dr. Bloomfield  At the time of the visit.  We reviewed the resident's history and exam and pertinent patient test results.  I agree with the assessment, diagnosis, and plan of care documented in the resident's note.  

## 2020-06-08 ENCOUNTER — Ambulatory Visit (HOSPITAL_COMMUNITY): Payer: BLUE CROSS/BLUE SHIELD

## 2020-07-05 ENCOUNTER — Other Ambulatory Visit: Payer: Self-pay | Admitting: Internal Medicine

## 2020-07-05 DIAGNOSIS — I10 Essential (primary) hypertension: Secondary | ICD-10-CM

## 2020-07-12 ENCOUNTER — Other Ambulatory Visit: Payer: Self-pay | Admitting: Internal Medicine

## 2020-07-12 DIAGNOSIS — E1142 Type 2 diabetes mellitus with diabetic polyneuropathy: Secondary | ICD-10-CM

## 2020-10-06 ENCOUNTER — Other Ambulatory Visit: Payer: Self-pay | Admitting: Internal Medicine

## 2020-10-06 DIAGNOSIS — E1165 Type 2 diabetes mellitus with hyperglycemia: Secondary | ICD-10-CM

## 2020-10-11 ENCOUNTER — Other Ambulatory Visit: Payer: Self-pay | Admitting: Student

## 2020-10-11 DIAGNOSIS — I10 Essential (primary) hypertension: Secondary | ICD-10-CM

## 2020-10-12 ENCOUNTER — Other Ambulatory Visit: Payer: Self-pay | Admitting: Internal Medicine

## 2020-10-12 DIAGNOSIS — E1142 Type 2 diabetes mellitus with diabetic polyneuropathy: Secondary | ICD-10-CM

## 2020-10-16 ENCOUNTER — Other Ambulatory Visit: Payer: Self-pay | Admitting: Student

## 2020-10-16 ENCOUNTER — Encounter: Payer: Self-pay | Admitting: Internal Medicine

## 2020-10-16 ENCOUNTER — Ambulatory Visit (INDEPENDENT_AMBULATORY_CARE_PROVIDER_SITE_OTHER): Payer: BLUE CROSS/BLUE SHIELD | Admitting: Internal Medicine

## 2020-10-16 VITALS — BP 148/82 | HR 77 | Wt 202.0 lb

## 2020-10-16 DIAGNOSIS — R748 Abnormal levels of other serum enzymes: Secondary | ICD-10-CM

## 2020-10-16 DIAGNOSIS — E1165 Type 2 diabetes mellitus with hyperglycemia: Secondary | ICD-10-CM | POA: Diagnosis not present

## 2020-10-16 DIAGNOSIS — E785 Hyperlipidemia, unspecified: Secondary | ICD-10-CM

## 2020-10-16 DIAGNOSIS — R1010 Upper abdominal pain, unspecified: Secondary | ICD-10-CM | POA: Diagnosis not present

## 2020-10-16 DIAGNOSIS — I10 Essential (primary) hypertension: Secondary | ICD-10-CM

## 2020-10-16 LAB — GLUCOSE, CAPILLARY: Glucose-Capillary: 278 mg/dL — ABNORMAL HIGH (ref 70–99)

## 2020-10-16 LAB — POCT GLYCOSYLATED HEMOGLOBIN (HGB A1C): Hemoglobin A1C: 11.1 % — AB (ref 4.0–5.6)

## 2020-10-16 MED ORDER — TRESIBA FLEXTOUCH 100 UNIT/ML ~~LOC~~ SOPN: 30.0000 [IU] | PEN_INJECTOR | Freq: Every day | SUBCUTANEOUS | 0 refills | Status: DC

## 2020-10-16 MED ORDER — LISINOPRIL 40 MG PO TABS: 40.0000 mg | ORAL_TABLET | Freq: Every day | ORAL | 0 refills | Status: DC

## 2020-10-16 MED ORDER — EMPAGLIFLOZIN 10 MG PO TABS: 10.0000 mg | ORAL_TABLET | Freq: Every day | ORAL | 2 refills | Status: DC

## 2020-10-16 MED ORDER — METOCLOPRAMIDE HCL 5 MG PO TABS: 5.0000 mg | ORAL_TABLET | Freq: Three times a day (TID) | ORAL | 0 refills | Status: DC

## 2020-10-16 MED ORDER — ATORVASTATIN CALCIUM 40 MG PO TABS: 40.0000 mg | ORAL_TABLET | Freq: Every day | ORAL | 3 refills | Status: DC

## 2020-10-16 NOTE — Patient Instructions (Signed)
Thank you for allowing [place] to provide your care today. Today we discussed your diabetes and abdominal pain.    I have ordered labs for you. I will call if any are abnormal.    Today we made no changes to your medications.    Please follow-up in 3 months.    Should you have any questions or concerns please call the internal medicine clinic at [phone].

## 2020-10-16 NOTE — Assessment & Plan Note (Signed)
Blood pressure remains elevated today.  States she has not been taking lisinopril for over a week.  Sent in a refill of her lisinopril 40 mg.  Patient was apprised that she has a diagnosis of hypertension.  States she was started on lisinopril due to protect her kidneys.  She was visibly upset when she was informed that she has had multiple high blood pressure readings.  Discussed importance of blood pressure control.  In the past she did not tolerate hydrochlorothiazide.  Discussed possibly adding a calcium channel blocker.  She does endorse a dry cough on lisinopril.  Discussed switching to amlodipine today but patient was not interested.  We will need to follow-up blood pressure at next visit.

## 2020-10-16 NOTE — Assessment & Plan Note (Addendum)
Patient has a history of an elevated alk phos.  Right upper quadrant ultrasound was ordered during her last visit however this was never completed.  We will repeat LFTs today.  Patient does endorse abdominal pain which she describes as gas bubbles and sharp pain.  Denies any nausea or vomiting.  Does endorse loose stools approximately 1-2 times a week.  She states these episodes occur if she does not eat well or frequently.  She has tried Reglan in the past and states this made her pain worse.  Question whether this is all related to her delayed gastric emptying versus gallbladder etiology.  Obtain a right upper quadrant ultrasound.  Plan: Follow-up LFTs Follow-up right upper quadrant Encouraged increasing fiber intake and trying Gas-X

## 2020-10-16 NOTE — Assessment & Plan Note (Signed)
Patient continues to endorse abdominal pain and bloating which she describes as gas bubbles.  States the pain is worse when she does not eat.  Denies any nausea or vomiting.  Endorses occasional loose stools if she does not eat well more frequently.  States Reglan has made her symptoms worse in the past.  Question whether this is due to uncontrolled diabetes versus gallbladder etiology.  Will obtain right upper quadrant ultrasound

## 2020-10-16 NOTE — Assessment & Plan Note (Signed)
Patient presents for follow-up of her diabetes.  Last hemoglobin A1c in January was 11.1.  Patient states that she has been eating poorly due to all the holidays.  She has been taking [place] 30 units and Jardiance 10 mg daily.  She ran out of [place] about a week ago due to increasing her dose from 20 units to 30 units.  She states overall she has been feeling well and motivated to improving her dietary habits.  We will repeat hemoglobin A1c today.  Did discuss uptitrating patient's medications and/or starting a new agent if her diabetes has not improved.  Patient did express hesitance to increasing Jardiance because the past she has not tolerated higher doses.  Will discuss options once we have her hemoglobin A1c  Plan: Follow-up hemoglobin A1c Encouraged lifestyle modifications

## 2020-10-16 NOTE — Progress Notes (Signed)
   CC: DM  HPI:  [name] is a 60 y.o. with a past medical history listed below presenting for evaluation of her diabetes. For details of today's visit and the status of his chronic medical issues please refer to the assessment and plan.   Past Medical History:  Diagnosis Date  . Abnormal serum level of lipase 03/08/2019  . Diabetes mellitus   . DVT of lower limb, acute (HCC) Age 28   Went on blood thinner for a few months  . Hyperlipidemia   . Hyperlipidemia   . Vertigo    Review of Systems:   Review of Systems  Constitutional: Negative for chills and fever.  Respiratory: Positive for cough. Negative for sputum production and shortness of breath.   Cardiovascular: Positive for leg swelling.  Gastrointestinal: Positive for abdominal pain and diarrhea. Negative for blood in stool, nausea and vomiting.     Physical Exam:  Vitals:   10/16/20 1005  BP: (!) 148/82  Pulse: 77  SpO2: 100%  Weight: 202 lb (91.6 kg)    Physical Exam Constitutional:      General: She is not in acute distress.    Appearance: Normal appearance. She is not ill-appearing.  Cardiovascular:     Rate and Rhythm: Normal rate and regular rhythm.     Pulses: Normal pulses.     Heart sounds: Normal heart sounds. No murmur heard. No friction rub. No gallop.   Pulmonary:     Effort: Pulmonary effort is normal. No respiratory distress.     Breath sounds: Normal breath sounds. No wheezing.  Abdominal:     General: Abdomen is flat. Bowel sounds are normal. There is no distension.     Palpations: Abdomen is soft.     Tenderness: There is no abdominal tenderness. There is no guarding.  Musculoskeletal:        General: No swelling or tenderness.     Right lower leg: No edema.     Left lower leg: No edema.  Skin:    General: Skin is warm and dry.  Neurological:     Mental Status: She is alert and oriented to person, place, and time.  Psychiatric:        Mood and Affect: Mood normal.         Behavior: Behavior normal.        Thought Content: Thought content normal.        Judgment: Judgment normal.     Assessment & Plan:   See Encounters Tab for problem based charting.  Patient discussed with Dr. Mayford Knife

## 2020-10-17 LAB — HEPATIC FUNCTION PANEL
ALT: 30 IU/L (ref 0–32)
AST: 19 IU/L (ref 0–40)
Albumin: 3.9 g/dL (ref 3.8–4.9)
Alkaline Phosphatase: 164 IU/L — ABNORMAL HIGH (ref 44–121)
Bilirubin Total: <0.2 mg/dL (ref 0.0–1.2)
Bilirubin, Direct: <0.1 mg/dL (ref 0.00–0.40)
Total Protein: 6.6 g/dL (ref 6.0–8.5)

## 2020-10-17 NOTE — Progress Notes (Signed)
Internal Medicine Clinic Attending ° °Case discussed with Dr. Rehman  At the time of the visit.  We reviewed the resident’s history and exam and pertinent patient test results.  I agree with the assessment, diagnosis, and plan of care documented in the resident’s note.  ° °

## 2020-10-31 ENCOUNTER — Emergency Department (HOSPITAL_BASED_OUTPATIENT_CLINIC_OR_DEPARTMENT_OTHER)
Admission: EM | Admit: 2020-10-31 | Discharge: 2020-11-01 | Disposition: A | Discharge status: Home / Self Care | Payer: BLUE CROSS/BLUE SHIELD | Attending: Emergency Medicine | Admitting: Emergency Medicine

## 2020-10-31 ENCOUNTER — Encounter (HOSPITAL_BASED_OUTPATIENT_CLINIC_OR_DEPARTMENT_OTHER): Payer: Self-pay

## 2020-10-31 ENCOUNTER — Other Ambulatory Visit: Payer: Self-pay

## 2020-10-31 DIAGNOSIS — E119 Type 2 diabetes mellitus without complications: Secondary | ICD-10-CM | POA: Insufficient documentation

## 2020-10-31 DIAGNOSIS — Z794 Long term (current) use of insulin: Secondary | ICD-10-CM | POA: Insufficient documentation

## 2020-10-31 DIAGNOSIS — Z7982 Long term (current) use of aspirin: Secondary | ICD-10-CM | POA: Diagnosis not present

## 2020-10-31 DIAGNOSIS — R109 Unspecified abdominal pain: Secondary | ICD-10-CM | POA: Insufficient documentation

## 2020-10-31 DIAGNOSIS — R197 Diarrhea, unspecified: Secondary | ICD-10-CM | POA: Insufficient documentation

## 2020-10-31 DIAGNOSIS — F17210 Nicotine dependence, cigarettes, uncomplicated: Secondary | ICD-10-CM | POA: Insufficient documentation

## 2020-10-31 NOTE — ED Triage Notes (Signed)
Pt c/o abd pain, diarrhea x 7 days-NAD-steady gait

## 2020-11-01 ENCOUNTER — Emergency Department (HOSPITAL_BASED_OUTPATIENT_CLINIC_OR_DEPARTMENT_OTHER): Payer: BLUE CROSS/BLUE SHIELD

## 2020-11-01 ENCOUNTER — Encounter (HOSPITAL_BASED_OUTPATIENT_CLINIC_OR_DEPARTMENT_OTHER): Payer: Self-pay | Admitting: Emergency Medicine

## 2020-11-01 LAB — CBC WITH DIFFERENTIAL/PLATELET
Abs Immature Granulocytes: 0.03 10*3/uL (ref 0.00–0.07)
Basophils Absolute: 0.1 10*3/uL (ref 0.0–0.1)
Basophils Relative: 0 %
Eosinophils Absolute: 0.1 10*3/uL (ref 0.0–0.5)
Eosinophils Relative: 1 %
HCT: 43.2 % (ref 36.0–46.0)
Hemoglobin: 14 g/dL (ref 12.0–15.0)
Immature Granulocytes: 0 %
Lymphocytes Relative: 32 %
Lymphs Abs: 3.7 10*3/uL (ref 0.7–4.0)
MCH: 25.6 pg — ABNORMAL LOW (ref 26.0–34.0)
MCHC: 32.4 g/dL (ref 30.0–36.0)
MCV: 79 fL — ABNORMAL LOW (ref 80.0–100.0)
Monocytes Absolute: 0.7 10*3/uL (ref 0.1–1.0)
Monocytes Relative: 6 %
Neutro Abs: 7.1 10*3/uL (ref 1.7–7.7)
Neutrophils Relative %: 61 %
Platelets: 251 10*3/uL (ref 150–400)
RBC: 5.47 MIL/uL — ABNORMAL HIGH (ref 3.87–5.11)
RDW: 14.6 % (ref 11.5–15.5)
WBC: 11.7 10*3/uL — ABNORMAL HIGH (ref 4.0–10.5)
nRBC: 0 % (ref 0.0–0.2)

## 2020-11-01 LAB — COMPREHENSIVE METABOLIC PANEL
ALT: 25 U/L (ref 0–44)
AST: 21 U/L (ref 15–41)
Albumin: 3.6 g/dL (ref 3.5–5.0)
Alkaline Phosphatase: 120 U/L (ref 38–126)
Anion gap: 8 (ref 5–15)
BUN: 29 mg/dL — ABNORMAL HIGH (ref 6–20)
CO2: 25 mmol/L (ref 22–32)
Calcium: 9.1 mg/dL (ref 8.9–10.3)
Chloride: 100 mmol/L (ref 98–111)
Creatinine, Ser: 1.19 mg/dL — ABNORMAL HIGH (ref 0.44–1.00)
GFR, Estimated: 53 mL/min — ABNORMAL LOW (ref >60)
Glucose, Bld: 308 mg/dL — ABNORMAL HIGH (ref 70–99)
Potassium: 4.5 mmol/L (ref 3.5–5.1)
Sodium: 133 mmol/L — ABNORMAL LOW (ref 135–145)
Total Bilirubin: <0.1 mg/dL — ABNORMAL LOW (ref 0.3–1.2)
Total Protein: 7 g/dL (ref 6.5–8.1)

## 2020-11-01 LAB — LIPASE, BLOOD: Lipase: 37 U/L (ref 11–51)

## 2020-11-01 MED ORDER — GABAPENTIN 300 MG PO CAPS
600.0000 mg | ORAL_CAPSULE | Freq: Once | ORAL | Status: AC
  Administered 2020-11-01: 300 mg via ORAL
  Filled 2020-11-01: qty 2

## 2020-11-01 MED ORDER — FENTANYL CITRATE (PF) 100 MCG/2ML IJ SOLN
50.0000 ug | Freq: Once | INTRAMUSCULAR | Status: AC
  Administered 2020-11-01: 50 ug via INTRAVENOUS
  Filled 2020-11-01 (×2): qty 2

## 2020-11-01 MED ORDER — LACTATED RINGERS IV BOLUS
1000.0000 mL | Freq: Once | INTRAVENOUS | Status: AC
  Administered 2020-11-01: 1000 mL via INTRAVENOUS

## 2020-11-01 MED ORDER — LOPERAMIDE HCL 2 MG PO CAPS
4.0000 mg | ORAL_CAPSULE | Freq: Once | ORAL | Status: AC
  Administered 2020-11-01: 4 mg via ORAL
  Filled 2020-11-01: qty 2

## 2020-11-01 MED ORDER — IOHEXOL 300 MG/ML  SOLN
100.0000 mL | Freq: Once | INTRAMUSCULAR | Status: AC | PRN
  Administered 2020-11-01: 100 mL via INTRAVENOUS

## 2020-11-01 MED ORDER — DICYCLOMINE HCL 20 MG PO TABS: 20.0000 mg | ORAL_TABLET | Freq: Four times a day (QID) | ORAL | 0 refills | Status: DC | PRN

## 2020-11-01 MED ORDER — ONDANSETRON HCL 4 MG/2ML IJ SOLN
4.0000 mg | Freq: Once | INTRAMUSCULAR | Status: AC
  Administered 2020-11-01: 4 mg via INTRAVENOUS
  Filled 2020-11-01: qty 2

## 2020-11-01 NOTE — ED Provider Notes (Signed)
[facility] Provider Note: Georgena Spurling, MD, FACEP  CSN: [id number] MRN: [id number] ARRIVAL: 10/31/20 at [place]: [place]  Abdominal Pain   HISTORY OF PRESENT ILLNESS  11/01/20 12:15 AM Kristen Garrett is a 60 y.o. female who has had abdominal pain for about a week.  She states it started with a sensation of gas but has progressed to crampy, poorly localized pain.  She rates the pain as a 7 out of 10.  It is worse with palpation.  She has had associated diarrhea, worse after eating.  She has had no nausea or vomiting.  She feels her abdomen is somewhat bloated.  She has had no blood in her stool.  She has not taken anything for her symptoms.   Past Medical History:  Diagnosis Date  . Abnormal serum level of lipase 03/08/2019  . Diabetes mellitus   . DVT of lower limb, acute ([place]) Age 86   Went on blood thinner for a few months  . Hyperlipidemia   . Vertigo     Past Surgical History:  Procedure Laterality Date  . ENDOMETRIAL ABLATION W/ NOVASURE    . TUBAL LIGATION      Family History  Problem Relation Age of Onset  . Diabetes Mother   . Hypertension Mother   . Hyperlipidemia Mother   . Diabetes Father   . Hyperlipidemia Father   . Diabetes Brother     Social History   Tobacco Use  . Smoking status: Current Every Day Smoker    Packs/day: 0.50    Years: 40.00    Pack years: 20.00    Types: Cigarettes  . Smokeless tobacco: Never Used  Substance Use Topics  . Alcohol use: No    Alcohol/week: 0.0 standard drinks  . Drug use: No    Prior to Admission medications   Medication Sig Start Date End Date Taking? Authorizing Provider  dicyclomine (BENTYL) 20 MG tablet Take 1 tablet (20 mg total) by mouth 4 (four) times daily as needed (Abdominal cramping). 11/01/20  Yes Zymeir Salminen, MD  gabapentin (NEURONTIN) 300 MG capsule Take by mouth. 09/14/18  Yes [provider]  acetaminophen (TYLENOL) 325 MG tablet Take 2 tablets (650 mg  total) by mouth every 6 (six) hours as needed for moderate pain. 03/10/19   Madalyn Rob, MD  aspirin 81 MG chewable tablet Chew 1 tablet (81 mg total) by mouth daily. 04/05/20   Bloomfield, Carley D, DO  atorvastatin (LIPITOR) 40 MG tablet Take 1 tablet (40 mg total) by mouth daily. 10/16/20 10/16/21  Rehman, Areeg N, DO  Blood Glucose Monitoring Suppl (RELION ULTIMA GLUCOSE SYSTEM) w/Device KIT 1 Device by Does not apply route daily. 08/02/15   Zenia Resides, MD  cholecalciferol (VITAMIN D3) 25 MCG (1000 UNIT) tablet Take 1 tablet (1,000 Units total) by mouth daily. 04/05/20   Bloomfield, Nila Nephew D, DO  Continuous Blood Gluc Receiver (DEXCOM G6 RECEIVER) DEVI 1 each by Does not apply route 6 (six) times daily. Dx code: E11.42; she is insulin requiring. 01/14/19   [name], MD  Continuous Blood Gluc Sensor (DEXCOM G6 SENSOR) MISC 1 each by Does not apply route 6 (six) times daily. Dx code: E11.42; she is insulin requiring. 01/14/19   [name], MD  Continuous Blood Gluc Sensor (FREESTYLE LIBRE 14 DAY SENSOR) MISC USE AS DIRECTED TO CHECK BLOOD SUGAR FOUR TIMES DAILY 10/17/20   Lacinda Axon, MD  Continuous Blood Gluc Transmit [facility]  G6 TRANSMITTER) MISC 1 each by Does not apply route 6 (six) times daily. Dx code: E11.42; she is insulin requiring. 01/14/19   [name], MD  empagliflozin (JARDIANCE) 10 MG TABS tablet Take 1 tablet (10 mg total) by mouth daily. 10/16/20   Rehman, Areeg N, DO  gabapentin (NEURONTIN) 300 MG capsule TAKE 2 CAPSULES BY MOUTH THREE TIMES DAILY 10/16/20   Lacinda Axon, MD  glucose blood (RELION GLUCOSE TEST STRIPS) test strip Use as instructed 08/02/15   Zenia Resides, MD  insulin degludec (TRESIBA FLEXTOUCH) 100 UNIT/ML FlexTouch Pen Inject 30 Units into the skin daily. 10/16/20   Rehman, Areeg N, DO  Insulin Pen Needle 32G X 4 MM MISC Use to inject insulin one time daily. Dx E11.65 (Prefers BD Pen Needles) 04/05/20   Bloomfield, Jovista D, DO   Lancets Misc. (RELION LANCING DEVICE) KIT 1 Device by Does not apply route daily. 08/02/15   Zenia Resides, MD  lisinopril (ZESTRIL) 40 MG tablet Take 1 tablet (40 mg total) by mouth daily. 10/16/20   Rehman, Areeg N, DO  lisinopril (ZESTRIL) 40 MG tablet Take by mouth.    [provider]  Multiple Vitamin (MULTIVITAMIN WITH MINERALS) TABS tablet Take 1 tablet by mouth daily.    [provider]    Allergies Glipizide and Metformin and related   REVIEW OF SYSTEMS  Negative except as noted here or in the History of Present Illness.   PHYSICAL EXAMINATION  Initial Vital Signs Blood pressure 104/64, pulse 92, temperature 98.4 F (36.9 C), temperature source Oral, resp. rate 18, height '5\' 6"'  (1.676 m), weight 92.5 kg, last menstrual period 02/17/2015, SpO2 97 %.  Examination General: Well-developed, well-nourished female in no acute distress; appearance consistent with age of record HENT: normocephalic; atraumatic Eyes: pupils equal, round and reactive to light; extraocular muscles intact Neck: supple Heart: regular rate and rhythm Lungs: clear to auscultation bilaterally Abdomen: soft; mildly distended; diffusely tender; bowel sounds present Extremities: No deformity; full range of motion; pulses normal Neurologic: Awake, alert and oriented; motor function intact in all extremities and symmetric; no facial droop Skin: Warm and dry Psychiatric: Normal mood and affect   RESULTS  Summary of this visit's results, reviewed and interpreted by myself:   EKG Interpretation  Date/Time:    Ventricular Rate:    PR Interval:    QRS Duration:   QT Interval:    QTC Calculation:   R Axis:     Text Interpretation:        Laboratory Studies: Results for orders placed or performed during the hospital encounter of 10/31/20 (from the past 24 hour(s))  CBC with Differential/Platelet     Status: Abnormal   Collection Time: 11/01/20 12:47 AM  Result Value Ref Range    WBC 11.7 (H) 4.0 - 10.5 K/uL   RBC 5.47 (H) 3.87 - 5.11 MIL/uL   Hemoglobin 14.0 12.0 - 15.0 g/dL   HCT 43.2 36.0 - 46.0 %   MCV 79.0 (L) 80.0 - 100.0 fL   MCH 25.6 (L) 26.0 - 34.0 pg   MCHC 32.4 30.0 - 36.0 g/dL   RDW 14.6 11.5 - 15.5 %   Platelets 251 150 - 400 K/uL   nRBC 0.0 0.0 - 0.2 %   Neutrophils Relative % 61 %   Neutro Abs 7.1 1.7 - 7.7 K/uL   Lymphocytes Relative 32 %   Lymphs Abs 3.7 0.7 - 4.0 K/uL   Monocytes Relative 6 %  Monocytes Absolute 0.7 0.1 - 1.0 K/uL   Eosinophils Relative 1 %   Eosinophils Absolute 0.1 0.0 - 0.5 K/uL   Basophils Relative 0 %   Basophils Absolute 0.1 0.0 - 0.1 K/uL   Immature Granulocytes 0 %   Abs Immature Granulocytes 0.03 0.00 - 0.07 K/uL  Comprehensive metabolic panel     Status: Abnormal   Collection Time: 11/01/20 12:47 AM  Result Value Ref Range   Sodium 133 (L) 135 - 145 mmol/L   Potassium 4.5 3.5 - 5.1 mmol/L   Chloride 100 98 - 111 mmol/L   CO2 25 22 - 32 mmol/L   Glucose, Bld 308 (H) 70 - 99 mg/dL   BUN 29 (H) 6 - 20 mg/dL   Creatinine, Ser 1.19 (H) 0.44 - 1.00 mg/dL   Calcium 9.1 8.9 - 10.3 mg/dL   Total Protein 7.0 6.5 - 8.1 g/dL   Albumin 3.6 3.5 - 5.0 g/dL   AST 21 15 - 41 U/L   ALT 25 0 - 44 U/L   Alkaline Phosphatase 120 38 - 126 U/L   Total Bilirubin <0.1 (L) 0.3 - 1.2 mg/dL   GFR, Estimated 53 (L) >60 mL/min   Anion gap 8 5 - 15  Lipase, blood     Status: None   Collection Time: 11/01/20 12:47 AM  Result Value Ref Range   Lipase 37 11 - 51 U/L   Imaging Studies: CT ABDOMEN PELVIS W CONTRAST  Result Date: 11/01/2020 CLINICAL DATA:  Abdominal pain and diarrhea x1 week. EXAM: CT ABDOMEN AND PELVIS WITH CONTRAST TECHNIQUE: Multidetector CT imaging of the abdomen and pelvis was performed using the standard protocol following bolus administration of intravenous contrast. CONTRAST:  140m OMNIPAQUE IOHEXOL 300 MG/ML  SOLN COMPARISON:  March 07, 2019 FINDINGS: Lower chest: No acute abnormality. Hepatobiliary: No  focal liver abnormality is seen. No gallstones, gallbladder wall thickening, or biliary dilatation. Pancreas: Unremarkable. No pancreatic ductal dilatation or surrounding inflammatory changes. Spleen: Normal in size without focal abnormality. Adrenals/Urinary Tract: Adrenal glands are unremarkable. Kidneys are normal, without renal calculi, focal lesion, or hydronephrosis. Bladder is unremarkable. Stomach/Bowel: Stomach is within normal limits. Appendix appears normal. No evidence of bowel wall thickening, distention, or inflammatory changes. Noninflamed diverticula are seen throughout the sigmoid colon. Vascular/Lymphatic: Aortic atherosclerosis. No enlarged abdominal or pelvic lymph nodes. Reproductive: Uterus and bilateral adnexa are unremarkable. Other: No abdominal wall hernia or abnormality. No abdominopelvic ascites. Musculoskeletal: Moderate severity degenerative changes are seen within the lower lumbar spine at the levels of L3-L4 and L4-L5. IMPRESSION: 1. Sigmoid diverticulosis. Electronically Signed   By: TVirgina NorfolkM.D.   On: 11/01/2020 02:36    ED COURSE and MDM  Nursing notes, initial and subsequent vitals signs, including pulse oximetry, reviewed and interpreted by myself.  Vitals:   10/31/20 2330 10/31/20 2353 11/01/20 0000 11/01/20 0110  BP: 104/64 104/64 116/68 116/68  Pulse: 93 92 90 84  Resp: '18 18  18  ' Temp:  98.4 F (36.9 C)  98.1 F (36.7 C)  TempSrc:  Oral  Oral  SpO2: 92% 97% 97% 100%  Weight:      Height:       Medications  loperamide (IMODIUM) capsule 4 mg (has no administration in time range)  lactated ringers bolus 1,000 mL (1,000 mLs Intravenous New Bag/Given 11/01/20 0053)  ondansetron (ZOFRAN) injection 4 mg (4 mg Intravenous Given 11/01/20 0054)  fentaNYL (SUBLIMAZE) injection 50 mcg (50 mcg Intravenous Given 11/01/20 0128)  iohexol (OMNIPAQUE) 300  MG/ML solution 100 mL (100 mLs Intravenous Contrast Given 11/01/20 0136)  gabapentin (NEURONTIN) capsule 600 mg  (300 mg Oral Given 11/01/20 0216)   2:45 AM Patient advised of reassuring CT scan.  I suspect her symptoms may be due to an acute viral illness.  She is not vomiting.  She may benefit from Bentyl and/or Imodium for her diarrhea and cramping.   PROCEDURES  Procedures   ED DIAGNOSES     ICD-10-CM   1. Abdominal cramping  R10.9   2. Diarrhea of presumed infectious origin  R19.7        Shanon Rosser, MD 11/01/20 [phone]

## 2020-11-01 NOTE — ED Notes (Signed)
Patient transported to CT 

## 2020-11-19 ENCOUNTER — Encounter: Payer: BLUE CROSS/BLUE SHIELD | Admitting: Internal Medicine

## 2020-11-22 ENCOUNTER — Encounter: Payer: BLUE CROSS/BLUE SHIELD | Admitting: Internal Medicine

## 2020-11-22 NOTE — Progress Notes (Deleted)
   CC: ***  HPI:  Ms.Ulyana Dante is a 60 y.o.   Past Medical History:  Diagnosis Date  . Abnormal serum level of lipase 03/08/2019  . Diabetes mellitus   . DVT of lower limb, acute (HCC) Age 52   Went on blood thinner for a few months  . Hyperlipidemia   . Vertigo    Review of Systems:  ***  Physical Exam:  There were no vitals filed for this visit. ***  Assessment & Plan:   See Encounters Tab for problem based charting.  Patient {GC/GE:[id number]::"discussed with","seen with"} Dr. {NAMES:[id number]::"Butcher","Guilloud","Hoffman","Mullen","Narendra","Raines","Vincent"}

## 2020-11-24 ENCOUNTER — Encounter: Payer: Self-pay | Admitting: *Deleted

## 2021-01-24 ENCOUNTER — Other Ambulatory Visit: Payer: Self-pay | Admitting: Internal Medicine

## 2021-01-24 DIAGNOSIS — I10 Essential (primary) hypertension: Secondary | ICD-10-CM

## 2021-02-26 ENCOUNTER — Other Ambulatory Visit: Payer: Self-pay | Admitting: Student

## 2021-02-26 ENCOUNTER — Other Ambulatory Visit: Payer: Self-pay | Admitting: Internal Medicine

## 2021-02-26 DIAGNOSIS — E1165 Type 2 diabetes mellitus with hyperglycemia: Secondary | ICD-10-CM

## 2021-02-27 ENCOUNTER — Encounter: Payer: BLUE CROSS/BLUE SHIELD | Admitting: Internal Medicine

## 2021-02-28 ENCOUNTER — Telehealth: Payer: Self-pay

## 2021-02-28 NOTE — Telephone Encounter (Signed)
TC to patient, she was informed that Dr. Marchia Bond sent this RX to her pharmacy yesterday and there is a confirmation receipt noted that her pharmacy received it.  She verbalized appreciation. SChaplin, RN,BSN

## 2021-02-28 NOTE — Telephone Encounter (Signed)
Continuous Blood Gluc Sensor (FREESTYLE LIBRE 14 DAY SENSOR) MISC, refill request @ [address] [facility] [address] - [place], [place] - [address]

## 2021-03-11 ENCOUNTER — Encounter: Payer: BLUE CROSS/BLUE SHIELD | Admitting: Internal Medicine

## 2021-05-16 ENCOUNTER — Other Ambulatory Visit: Payer: Self-pay

## 2021-05-17 ENCOUNTER — Other Ambulatory Visit: Payer: Self-pay | Admitting: Student

## 2021-05-17 DIAGNOSIS — I10 Essential (primary) hypertension: Secondary | ICD-10-CM

## 2021-05-20 ENCOUNTER — Encounter: Payer: Self-pay | Admitting: Student

## 2021-05-20 ENCOUNTER — Ambulatory Visit (INDEPENDENT_AMBULATORY_CARE_PROVIDER_SITE_OTHER): Payer: BLUE CROSS/BLUE SHIELD | Admitting: Student

## 2021-05-20 ENCOUNTER — Other Ambulatory Visit: Payer: Self-pay

## 2021-05-20 VITALS — BP 156/87 | HR 84 | Temp 97.4°F | Resp 24 | Ht 66.5 in | Wt 205.4 lb

## 2021-05-20 DIAGNOSIS — R1010 Upper abdominal pain, unspecified: Secondary | ICD-10-CM

## 2021-05-20 DIAGNOSIS — I10 Essential (primary) hypertension: Secondary | ICD-10-CM

## 2021-05-20 DIAGNOSIS — E1142 Type 2 diabetes mellitus with diabetic polyneuropathy: Secondary | ICD-10-CM | POA: Diagnosis not present

## 2021-05-20 DIAGNOSIS — R252 Cramp and spasm: Secondary | ICD-10-CM | POA: Insufficient documentation

## 2021-05-20 DIAGNOSIS — E559 Vitamin D deficiency, unspecified: Secondary | ICD-10-CM

## 2021-05-20 DIAGNOSIS — E785 Hyperlipidemia, unspecified: Secondary | ICD-10-CM

## 2021-05-20 DIAGNOSIS — Z1231 Encounter for screening mammogram for malignant neoplasm of breast: Secondary | ICD-10-CM

## 2021-05-20 DIAGNOSIS — Z1159 Encounter for screening for other viral diseases: Secondary | ICD-10-CM

## 2021-05-20 DIAGNOSIS — E1165 Type 2 diabetes mellitus with hyperglycemia: Secondary | ICD-10-CM

## 2021-05-20 DIAGNOSIS — Z0000 Encounter for general adult medical examination without abnormal findings: Secondary | ICD-10-CM

## 2021-05-20 LAB — GLUCOSE, CAPILLARY: Glucose-Capillary: 274 mg/dL — ABNORMAL HIGH (ref 70–99)

## 2021-05-20 LAB — POCT GLYCOSYLATED HEMOGLOBIN (HGB A1C): Hemoglobin A1C: 12.3 % — AB (ref 4.0–5.6)

## 2021-05-20 MED ORDER — GABAPENTIN 300 MG PO CAPS
600.0000 mg | ORAL_CAPSULE | Freq: Three times a day (TID) | ORAL | 3 refills | Status: DC
Start: 2021-05-20 — End: 2022-05-01

## 2021-05-20 MED ORDER — EMPAGLIFLOZIN 10 MG PO TABS: 10.0000 mg | ORAL_TABLET | Freq: Every day | ORAL | 2 refills | Status: DC

## 2021-05-20 MED ORDER — ATORVASTATIN CALCIUM 40 MG PO TABS: 40.0000 mg | ORAL_TABLET | Freq: Every day | ORAL | 3 refills | Status: DC

## 2021-05-20 MED ORDER — DICYCLOMINE HCL 20 MG PO TABS: 20.0000 mg | ORAL_TABLET | Freq: Four times a day (QID) | ORAL | 0 refills | Status: DC | PRN

## 2021-05-20 NOTE — Assessment & Plan Note (Signed)
BP Readings from Last 3 Encounters:  05/20/21 (!) 156/87  11/01/20 122/76  10/16/20 (!) 148/82   Blood pressure elevated today, 171/91 then 156/87 on repeat. Patient has not taken lisinopril in 4-5 days. Denies chest pain, dyspnea, dizziness, dry cough. We will check BMET today. Discussed with Ms. Heidt that she should return to clinic in one month (after taking the lisinopril) to re-evaluate.  Of note, after recommending follow-up in one month, patient stated, "My pressures are normally fine." Unfortunately due to time constraints we were unable to discuss this further. Per chart review, patient has had poor understanding of her medical conditions in the past. I believe she would further benefit from medication reconciliation and counseling regarding her medications. I will discuss this with her tomorrow after her lab work has resulted.  - Continue lisinopril 40mg  daily - BMET today - Will discuss referral to clinical pharmacist

## 2021-05-20 NOTE — Assessment & Plan Note (Signed)
-   Mammogram referral placed - Colonoscopy in October 2020 - Patient refused influenza vaccine today - Confirmed two doses COVID-19 vaccine - Hepatitis C screening today - Foot exam today

## 2021-05-20 NOTE — Assessment & Plan Note (Signed)
Patient reports compliance with her vitamin D supplement. Mentions that sometimes she has abdominal discomfort after taking this. We will re-check vitamin D level today. I suspect some of her intermittent abdominal pain could be 2/2 uncontrolled type II diabetes.  - Follow-up vitamin D level

## 2021-05-20 NOTE — Assessment & Plan Note (Signed)
During our follow-up visit, Kristen Garrett mentions recent leg cramping that she has been experiencing. Notes the cramps primarily occur in her calves bilaterally. States this normally happens once she lays down at night and will spontaneously resolve. She denies that standing or stopping walking stops the pain. No recent change in medication.  On exam, 1+ DP pulses were appreciated bilaterally. ABI's were performed today to assess for peripheral vascular disease. This resulted normal, 1.14 on L, 1.18 on R. We will obtain lab work for any underlying medical conditions that could be causing her symptoms. If negative we will treat symptomatically.   - F/u BMET, iron panel, B12, CBC, TSH, Mg

## 2021-05-20 NOTE — Patient Instructions (Signed)
Ms.Sheryn Korber, it was a pleasure seeing you today!  Today we discussed: - Diabetes: I held off re-filling your diabetes medications until we have your A1c. I will call you tomorrow regarding this.  - Blood pressure: Please re-start lisinopril. I would like for you to come back in one month to re-check this. Please make sure to take your lisinopril before your appointment.  - Muscle cramps: We will get these labs and see if there is an underlying cause to your cramps.  - I have sent in your mammogram  I have ordered the following labs today:  Lab Orders         Magnesium         BMP8+Anion Gap         Anemia Profile B         Hemoglobin A1c         TSH         Hepatitis C antibody         POC Hbg A1C       Referrals ordered today:   Referral Orders         Ambulatory referral to Ophthalmology       Follow-up:  1 month    Please make sure to arrive 15 minutes prior to your next appointment. If you arrive late, you may be asked to reschedule.   We look forward to seeing you next time. Please call our clinic at [phone] if you have any questions or concerns. The best time to call is Monday-Friday from 9am-4pm, but there is someone available 24/7. If after hours or the weekend, call the main hospital number and ask for the Internal Medicine Resident On-Call. If you need medication refills, please notify your pharmacy one week in advance and they will send [place] a request.  Thank you for letting us take part in your care. Wishing you the best!  Thank you, Evlyn Kanner, MD

## 2021-05-20 NOTE — Assessment & Plan Note (Signed)
Patient reports her neuropathic pain has been well-controlled on gabapentin. States her recent muscle cramps feel much different than this. Foot exam completed today.  - Continue gabapentin 600mg  three times daily

## 2021-05-20 NOTE — Assessment & Plan Note (Signed)
This is a chronic and stable issue. Last lipid panel in 2020. She reports compliance with atorvastatin. We will re-fill today, can consider repeat lipid panel at next visit.  - Atorvastatin 40mg  daily

## 2021-05-20 NOTE — Assessment & Plan Note (Signed)
Kristen Garrett reports she has been compliant with her medications, which includes [place] and [place]. She uses Libre continuous glucose monitor, but reports she has not been using this for the past few months. She has not been checking her sugars since this time. She denies polyuria, polydipsia, nausea, vomiting, abdominal pain. She does endorse neuropathy which has been well-controlled with gabapentin. Patient has not had eye exam this year. Foot exam completed during our visit today. Patient refused A1c prior to visit, so we will follow-up with repeat A1c and discuss this with her tomorrow.  - Follow-up A1c - We will continue Tresiba 30 units daily and Jardiance 10mg  daily until we can discuss updated A1c

## 2021-05-20 NOTE — Progress Notes (Signed)
   CC: regular follow-up  HPI:  [name] is a 60 y.o. with hypertension, hyperlipidemia presenting to [facility] for regular follow-up.  Please see problem-based list for further details.  Past Medical History:  Diagnosis Date   Abnormal serum level of lipase 03/08/2019   Diabetes mellitus    DVT of lower limb, acute (HCC) Age 42   Went on blood thinner for a few months   Hyperlipidemia    Vertigo    Review of Systems:  As per HPI  Physical Exam:  Vitals:   05/20/21 1118  BP: (!) 174/91  Pulse: 89  Resp: (!) 24  Temp: (!) 97.4 F (36.3 C)  TempSrc: Oral  SpO2: 96%  Weight: 205 lb 6.4 oz (93.2 kg)  Height: 5' 6.5" (1.689 m)   General: Resting comfortably in no acute distress CV: Regular rate, rhythm. No murmurs appreciated.  Pulm: Normal work of breathing on room air. Clear to auscultation bilaterally. MSK: Normal bulk, tone. No pitting edema bilateral lower extremities. Feet: 1+ dorsalis pedis pulses bilaterally. Vibratory sensation in tact bilaterally. No open wounds appreciated. Neuro: Awake, alert, conversing appropriately. No focal deficits.   Assessment & Plan:   See Encounters Tab for problem based charting.  Patient discussed with Dr. Mayford Knife

## 2021-05-21 ENCOUNTER — Telehealth (HOSPITAL_BASED_OUTPATIENT_CLINIC_OR_DEPARTMENT_OTHER): Payer: Self-pay

## 2021-05-21 LAB — BMP8+ANION GAP
Anion Gap: 11 mmol/L (ref 10.0–18.0)
BUN/Creatinine Ratio: 27 — ABNORMAL HIGH (ref 9–23)
BUN: 25 mg/dL — ABNORMAL HIGH (ref 6–24)
CO2: 24 mmol/L (ref 20–29)
Calcium: 9.5 mg/dL (ref 8.7–10.2)
Chloride: 104 mmol/L (ref 96–106)
Creatinine, Ser: 0.92 mg/dL (ref 0.57–1.00)
Glucose: 281 mg/dL — ABNORMAL HIGH (ref 70–99)
Potassium: 4.7 mmol/L (ref 3.5–5.2)
Sodium: 139 mmol/L (ref 134–144)
eGFR: 72 mL/min/{1.73_m2} (ref >59)

## 2021-05-21 LAB — ANEMIA PROFILE B
Basophils Absolute: 0 10*3/uL (ref 0.0–0.2)
Basos: 0 %
EOS (ABSOLUTE): 0.1 10*3/uL (ref 0.0–0.4)
Eos: 2 %
Ferritin: 274 ng/mL — ABNORMAL HIGH (ref 15–150)
Folate: >20 ng/mL (ref >3.0)
Hematocrit: 46 % (ref 34.0–46.6)
Hemoglobin: 14.7 g/dL (ref 11.1–15.9)
Immature Grans (Abs): 0 10*3/uL (ref 0.0–0.1)
Immature Granulocytes: 0 %
Iron Saturation: 20 % (ref 15–55)
Iron: 65 ug/dL (ref 27–159)
Lymphocytes Absolute: 2.7 10*3/uL (ref 0.7–3.1)
Lymphs: 37 %
MCH: 24.8 pg — ABNORMAL LOW (ref 26.6–33.0)
MCHC: 32 g/dL (ref 31.5–35.7)
MCV: 78 fL — ABNORMAL LOW (ref 79–97)
Monocytes Absolute: 0.5 10*3/uL (ref 0.1–0.9)
Monocytes: 7 %
Neutrophils Absolute: 3.9 10*3/uL (ref 1.4–7.0)
Neutrophils: 54 %
Platelets: 215 10*3/uL (ref 150–450)
RBC: 5.92 x10E6/uL — ABNORMAL HIGH (ref 3.77–5.28)
RDW: 15.1 % (ref 11.7–15.4)
Retic Ct Pct: 1.2 % (ref 0.6–2.6)
Total Iron Binding Capacity: 320 ug/dL (ref 250–450)
UIBC: 255 ug/dL (ref 131–425)
Vitamin B-12: 1938 pg/mL — ABNORMAL HIGH (ref 232–1245)
WBC: 7.3 10*3/uL (ref 3.4–10.8)

## 2021-05-21 LAB — VITAMIN D 25 HYDROXY (VIT D DEFICIENCY, FRACTURES): Vit D, 25-Hydroxy: 22.2 ng/mL — ABNORMAL LOW (ref 30.0–100.0)

## 2021-05-21 LAB — MAGNESIUM: Magnesium: 2.2 mg/dL (ref 1.6–2.3)

## 2021-05-21 LAB — HEPATITIS C ANTIBODY: Hep C Virus Ab: 0.2 s/co ratio (ref 0.0–0.9)

## 2021-05-21 LAB — TSH: TSH: 1.14 u[IU]/mL (ref 0.450–4.500)

## 2021-05-21 MED ORDER — IBUPROFEN 800 MG PO TABS: 800.0000 mg | ORAL_TABLET | Freq: Every evening | ORAL | 0 refills | Status: DC | PRN

## 2021-05-21 NOTE — Addendum Note (Signed)
Addended byEvlyn Kanner on: 05/21/2021 04:34 PM   Modules accepted: Orders

## 2021-05-27 NOTE — Progress Notes (Signed)
Internal Medicine Clinic Attending  Case discussed with Dr. Marijo Conception  At the time of the visit.  We reviewed the resident's history and exam and pertinent patient test results.  I agree with the assessment, diagnosis, and plan of care documented in the resident's note. Challenges to consistent treatment adherence are suspected.  Referral to clinical pharmacist for education could be very beneficial.

## 2021-06-04 ENCOUNTER — Other Ambulatory Visit: Payer: Self-pay

## 2021-06-04 ENCOUNTER — Encounter (HOSPITAL_BASED_OUTPATIENT_CLINIC_OR_DEPARTMENT_OTHER): Payer: Self-pay | Admitting: *Deleted

## 2021-06-04 ENCOUNTER — Emergency Department (HOSPITAL_BASED_OUTPATIENT_CLINIC_OR_DEPARTMENT_OTHER)
Admission: EM | Admit: 2021-06-04 | Discharge: 2021-06-04 | Disposition: A | Discharge status: Home / Self Care | Payer: BLUE CROSS/BLUE SHIELD | Attending: Emergency Medicine | Admitting: Emergency Medicine

## 2021-06-04 DIAGNOSIS — E1140 Type 2 diabetes mellitus with diabetic neuropathy, unspecified: Secondary | ICD-10-CM | POA: Diagnosis not present

## 2021-06-04 DIAGNOSIS — F17210 Nicotine dependence, cigarettes, uncomplicated: Secondary | ICD-10-CM | POA: Diagnosis not present

## 2021-06-04 DIAGNOSIS — X58XXXA Exposure to other specified factors, initial encounter: Secondary | ICD-10-CM | POA: Diagnosis not present

## 2021-06-04 DIAGNOSIS — Z7984 Long term (current) use of oral hypoglycemic drugs: Secondary | ICD-10-CM | POA: Diagnosis not present

## 2021-06-04 DIAGNOSIS — I10 Essential (primary) hypertension: Secondary | ICD-10-CM | POA: Diagnosis not present

## 2021-06-04 DIAGNOSIS — Z79899 Other long term (current) drug therapy: Secondary | ICD-10-CM | POA: Insufficient documentation

## 2021-06-04 DIAGNOSIS — T25021A Burn of unspecified degree of right foot, initial encounter: Secondary | ICD-10-CM | POA: Diagnosis present

## 2021-06-04 DIAGNOSIS — T25221A Burn of second degree of right foot, initial encounter: Secondary | ICD-10-CM | POA: Diagnosis not present

## 2021-06-04 DIAGNOSIS — Y929 Unspecified place or not applicable: Secondary | ICD-10-CM | POA: Diagnosis not present

## 2021-06-04 DIAGNOSIS — Z7982 Long term (current) use of aspirin: Secondary | ICD-10-CM | POA: Insufficient documentation

## 2021-06-04 DIAGNOSIS — Z794 Long term (current) use of insulin: Secondary | ICD-10-CM | POA: Diagnosis not present

## 2021-06-04 DIAGNOSIS — S90424A Blister (nonthermal), right lesser toe(s), initial encounter: Secondary | ICD-10-CM

## 2021-06-04 MED ORDER — AMOXICILLIN-POT CLAVULANATE 875-125 MG PO TABS
1.0000 | ORAL_TABLET | Freq: Once | ORAL | Status: AC
  Administered 2021-06-04: 1 via ORAL
  Filled 2021-06-04: qty 1

## 2021-06-04 MED ORDER — HYDROCODONE-ACETAMINOPHEN 5-325 MG PO TABS
1.0000 | ORAL_TABLET | Freq: Once | ORAL | Status: AC
  Administered 2021-06-04: 1 via ORAL
  Filled 2021-06-04: qty 1

## 2021-06-04 MED ORDER — HYDROCODONE-ACETAMINOPHEN 5-325 MG PO TABS: 1.0000 | ORAL_TABLET | Freq: Four times a day (QID) | ORAL | 0 refills | Status: DC | PRN

## 2021-06-04 MED ORDER — AMOXICILLIN-POT CLAVULANATE 875-125 MG PO TABS: 1.0000 | ORAL_TABLET | Freq: Two times a day (BID) | ORAL | 0 refills | Status: DC

## 2021-06-04 NOTE — ED Provider Notes (Signed)
[facility] EMERGENCY DEPARTMENT Provider Note   CSN: [id number] Arrival date & time: 06/04/21  0007     History Chief Complaint  Patient presents with   Open Wound    Kristen Garrett is a 60 y.o. female.  The history is provided by the patient and a relative.  Toe Pain This is a new problem. The current episode started 12 to 24 hours ago. The problem occurs constantly. The problem has been gradually worsening. The symptoms are aggravated by walking. The symptoms are relieved by rest.  Patient history of diabetes presents with toe pain & blister She reports she is noted a blister that occurred on her right second toe No known injuries.  She does have pain around the wound toe, but she also has neuropathy No fevers or chills.     Past Medical History:  Diagnosis Date   Diabetes mellitus    DVT of lower limb, acute (HCC) Age 66   Went on blood thinner for a few months   Hyperlipidemia    Vertigo     Patient Active Problem List   Diagnosis Date Noted   Muscle cramps 05/20/2021   Vitamin D deficiency 01/14/2019   Hyperlipidemia 07/20/2015   Xerosis of skin 07/13/2014   Onychomycosis 07/13/2014   Unspecified venous (peripheral) insufficiency 02/14/2013   Diabetic peripheral neuropathy ([place]) 11/03/2012   Left breast lump 06/01/2012   Enlarged thyroid 10/11/2011   Healthcare maintenance 10/11/2011   ALLERGIC RHINITIS, SEASONAL 09/07/2009   Type II diabetes mellitus ([place]) 08/27/2006   Tobacco use 08/27/2006   Essential hypertension 08/27/2006    Past Surgical History:  Procedure Laterality Date   ENDOMETRIAL ABLATION W/ NOVASURE     TUBAL LIGATION       OB History     Gravida  3   Para  2   Term  1   Preterm  1   AB      Living  2      SAB      IAB      Ectopic      Multiple      Live Births              Family History  Problem Relation Age of Onset   Diabetes Mother    Hypertension Mother    Hyperlipidemia Mother     Diabetes Father    Hyperlipidemia Father    Diabetes Brother     Social History   Tobacco Use   Smoking status: Every Day    Packs/day: 0.50    Years: 40.00    Pack years: 20.00    Types: Cigarettes   Smokeless tobacco: Never  Substance Use Topics   Alcohol use: No    Alcohol/week: 0.0 standard drinks   Drug use: No    Home Medications Prior to Admission medications   Medication Sig Start Date End Date Taking? Authorizing Provider  amoxicillin-clavulanate (AUGMENTIN) 875-125 MG tablet Take 1 tablet by mouth 2 (two) times daily. One po bid x 7 days 06/04/21  Yes Ripley Fraise, MD  HYDROcodone-acetaminophen (NORCO/VICODIN) 5-325 MG tablet Take 1 tablet by mouth every 6 (six) hours as needed for severe pain. 06/04/21  Yes Ripley Fraise, MD  acetaminophen (TYLENOL) 325 MG tablet Take 2 tablets (650 mg total) by mouth every 6 (six) hours as needed for moderate pain. 03/10/19   Madalyn Rob, MD  aspirin 81 MG chewable tablet Chew 1 tablet (81 mg total) by mouth daily. 04/05/20  Bloomfield, Carley D, DO  atorvastatin (LIPITOR) 40 MG tablet Take 1 tablet (40 mg total) by mouth daily. 05/20/21 05/20/22  Sanjuan Dame, MD  Blood Glucose Monitoring Suppl ([place]) w/Device KIT 1 Device by Does not apply route daily. 08/02/15   Zenia Resides, MD  cholecalciferol (VITAMIN D3) 25 MCG (1000 UNIT) tablet Take 1 tablet (1,000 Units total) by mouth daily. 04/05/20   Bloomfield, Carley D, DO  Continuous Blood Gluc Sensor (FREESTYLE LIBRE 14 DAY SENSOR) MISC USE AS DIRECTED TO CHECK BLOOD SUGAR FOUR TIMES DAILY 02/27/21   Marianna Payment, MD  dicyclomine (BENTYL) 20 MG tablet Take 1 tablet (20 mg total) by mouth 4 (four) times daily as needed (Abdominal cramping). 05/20/21   Sanjuan Dame, MD  empagliflozin (JARDIANCE) 10 MG TABS tablet Take 1 tablet (10 mg total) by mouth daily. 05/20/21   Sanjuan Dame, MD  gabapentin (NEURONTIN) 300 MG capsule Take 2 capsules (600 mg  total) by mouth 3 (three) times daily. 05/20/21   Sanjuan Dame, MD  glucose blood (RELION GLUCOSE TEST STRIPS) test strip Use as instructed 08/02/15   Zenia Resides, MD  ibuprofen (ADVIL) 800 MG tablet Take 1 tablet (800 mg total) by mouth at bedtime as needed. 05/21/21   Sanjuan Dame, MD  Insulin Pen Needle 32G X 4 MM MISC Use to inject insulin one time daily. Dx E11.65 (Prefers BD Pen Needles) 04/05/20   Bloomfield, [place] D, DO  Lancets Misc. (RELION LANCING DEVICE) KIT 1 Device by Does not apply route daily. 08/02/15   Zenia Resides, MD  lisinopril (ZESTRIL) 40 MG tablet Take 1 tablet by mouth once daily 05/20/21   Sanjuan Dame, MD  Multiple Vitamin (MULTIVITAMIN WITH MINERALS) TABS tablet Take 1 tablet by mouth daily.    [provider]  TRESIBA FLEXTOUCH 100 UNIT/ML FlexTouch Pen INJECT 30 UNITS SUBCUTANEOUSLY ONCE DAILY 02/27/21   Marianna Payment, MD    Allergies    Glipizide and Metformin and related  Review of Systems   Review of Systems  Constitutional:  Negative for fever.  Skin:  Positive for wound.   Physical Exam Updated Vital Signs BP (!) 148/74 (BP Location: Right Arm)   Pulse 85   Temp 98 F (36.7 C) (Oral)   Resp 16   Ht 1.676 m (_0 )   Wt 92.5 kg   LMP 02/17/2015   SpO2 99%   BMI 32.93 kg/m   Physical Exam CONSTITUTIONAL: Well developed/well nourished, no acute distress HEAD: Normocephalic/atraumatic EYES: EOMI ENMT: Mucous membranes moist NECK: supple no meningeal signs LUNGS: no apparent distress NEURO: Pt is awake/alert/appropriate, moves all extremitiesx4.  No facial droop.   EXTREMITIES: pulses normal/equal, full ROM, see photo below.  Blister noted to medial surface of right second toe.  No crepitus.  No drainage. There are no puncture wounds, no lacerations are noted to either foot.  No plantar wounds are noted  no wounds are noted in the webspaces. SKIN: warm, color normal, see photo PSYCH: no abnormalities of mood  noted, alert and oriented to situation   Patient gave verbal permission to utilize photo for medical documentation only The image was not stored on any personal device ED Results / Procedures / Treatments   Labs (all labs ordered are listed, but only abnormal results are displayed) Labs Reviewed - No data to display  EKG None  Radiology No results found.  Procedures Procedures   Medications Ordered in ED Medications  amoxicillin-clavulanate (AUGMENTIN) 875-125 MG per  tablet 1 tablet (1 tablet Oral Given 06/04/21 0225)  HYDROcodone-acetaminophen (NORCO/VICODIN) 5-325 MG per tablet 1 tablet (1 tablet Oral Given 06/04/21 0224)    ED Course  I have reviewed the triage vital signs and the nursing notes.     MDM Rules/Calculators/A&P                           Patient presents for blister on her right second toe.  No trauma. To help prevent any infection, will place on antibiotics.  Advised patient to follow-up with podiatry for further evaluation.  Advised we would not be draining the blister for now.  No other signs of injury or infection to the rest of the foot. Final Clinical Impression(s) / ED Diagnoses Final diagnoses:  Blister of toe of right foot, initial encounter    Rx / DC Orders ED Discharge Orders          Ordered    amoxicillin-clavulanate (AUGMENTIN) 875-125 MG tablet  2 times daily        06/04/21 0217    HYDROcodone-acetaminophen (NORCO/VICODIN) 5-325 MG tablet  Every 6 hours PRN        06/04/21 0217             Ripley Fraise, MD 06/04/21 [phone]

## 2021-06-04 NOTE — ED Triage Notes (Signed)
C/o open wound/blister to bottom of right foot x 1 day

## 2021-06-07 ENCOUNTER — Ambulatory Visit: Payer: Self-pay | Admitting: Podiatry

## 2021-06-24 ENCOUNTER — Emergency Department (HOSPITAL_BASED_OUTPATIENT_CLINIC_OR_DEPARTMENT_OTHER): Payer: BLUE CROSS/BLUE SHIELD

## 2021-06-24 ENCOUNTER — Emergency Department (HOSPITAL_BASED_OUTPATIENT_CLINIC_OR_DEPARTMENT_OTHER)
Admission: EM | Admit: 2021-06-24 | Discharge: 2021-06-24 | Disposition: A | Discharge status: Home / Self Care | Payer: BLUE CROSS/BLUE SHIELD | Attending: Emergency Medicine | Admitting: Emergency Medicine

## 2021-06-24 ENCOUNTER — Other Ambulatory Visit: Payer: Self-pay

## 2021-06-24 ENCOUNTER — Encounter (HOSPITAL_BASED_OUTPATIENT_CLINIC_OR_DEPARTMENT_OTHER): Payer: Self-pay | Admitting: Urology

## 2021-06-24 DIAGNOSIS — B351 Tinea unguium: Secondary | ICD-10-CM | POA: Insufficient documentation

## 2021-06-24 DIAGNOSIS — I10 Essential (primary) hypertension: Secondary | ICD-10-CM | POA: Insufficient documentation

## 2021-06-24 DIAGNOSIS — F17210 Nicotine dependence, cigarettes, uncomplicated: Secondary | ICD-10-CM | POA: Diagnosis not present

## 2021-06-24 DIAGNOSIS — Z7982 Long term (current) use of aspirin: Secondary | ICD-10-CM | POA: Diagnosis not present

## 2021-06-24 DIAGNOSIS — Z79899 Other long term (current) drug therapy: Secondary | ICD-10-CM | POA: Insufficient documentation

## 2021-06-24 DIAGNOSIS — Z7984 Long term (current) use of oral hypoglycemic drugs: Secondary | ICD-10-CM | POA: Insufficient documentation

## 2021-06-24 DIAGNOSIS — S90414A Abrasion, right lesser toe(s), initial encounter: Secondary | ICD-10-CM

## 2021-06-24 DIAGNOSIS — Z794 Long term (current) use of insulin: Secondary | ICD-10-CM | POA: Diagnosis not present

## 2021-06-24 DIAGNOSIS — L089 Local infection of the skin and subcutaneous tissue, unspecified: Secondary | ICD-10-CM | POA: Diagnosis present

## 2021-06-24 DIAGNOSIS — E1140 Type 2 diabetes mellitus with diabetic neuropathy, unspecified: Secondary | ICD-10-CM | POA: Diagnosis not present

## 2021-06-24 LAB — CBC WITH DIFFERENTIAL/PLATELET
Abs Immature Granulocytes: 0.03 10*3/uL (ref 0.00–0.07)
Basophils Absolute: 0 10*3/uL (ref 0.0–0.1)
Basophils Relative: 0 %
Eosinophils Absolute: 0.1 10*3/uL (ref 0.0–0.5)
Eosinophils Relative: 1 %
HCT: 44.7 % (ref 36.0–46.0)
Hemoglobin: 14.4 g/dL (ref 12.0–15.0)
Immature Granulocytes: 0 %
Lymphocytes Relative: 33 %
Lymphs Abs: 2.9 10*3/uL (ref 0.7–4.0)
MCH: 25.2 pg — ABNORMAL LOW (ref 26.0–34.0)
MCHC: 32.2 g/dL (ref 30.0–36.0)
MCV: 78.1 fL — ABNORMAL LOW (ref 80.0–100.0)
Monocytes Absolute: 0.6 10*3/uL (ref 0.1–1.0)
Monocytes Relative: 7 %
Neutro Abs: 5 10*3/uL (ref 1.7–7.7)
Neutrophils Relative %: 59 %
Platelets: 246 10*3/uL (ref 150–400)
RBC: 5.72 MIL/uL — ABNORMAL HIGH (ref 3.87–5.11)
RDW: 15.7 % — ABNORMAL HIGH (ref 11.5–15.5)
WBC: 8.6 10*3/uL (ref 4.0–10.5)
nRBC: 0 % (ref 0.0–0.2)

## 2021-06-24 LAB — COMPREHENSIVE METABOLIC PANEL
ALT: 23 U/L (ref 0–44)
AST: 20 U/L (ref 15–41)
Albumin: 3.8 g/dL (ref 3.5–5.0)
Alkaline Phosphatase: 122 U/L (ref 38–126)
Anion gap: 7 (ref 5–15)
BUN: 18 mg/dL (ref 6–20)
CO2: 27 mmol/L (ref 22–32)
Calcium: 9.3 mg/dL (ref 8.9–10.3)
Chloride: 105 mmol/L (ref 98–111)
Creatinine, Ser: 0.84 mg/dL (ref 0.44–1.00)
GFR, Estimated: >60 mL/min (ref >60)
Glucose, Bld: 187 mg/dL — ABNORMAL HIGH (ref 70–99)
Potassium: 4.3 mmol/L (ref 3.5–5.1)
Sodium: 139 mmol/L (ref 135–145)
Total Bilirubin: 0.3 mg/dL (ref 0.3–1.2)
Total Protein: 7.8 g/dL (ref 6.5–8.1)

## 2021-06-24 LAB — SEDIMENTATION RATE: Sed Rate: 31 mm/hr — ABNORMAL HIGH (ref 0–22)

## 2021-06-24 LAB — C-REACTIVE PROTEIN: CRP: 1.1 mg/dL — ABNORMAL HIGH (ref <1.0)

## 2021-06-24 MED ORDER — ACETAMINOPHEN 500 MG PO TABS
1000.0000 mg | ORAL_TABLET | Freq: Once | ORAL | Status: AC
  Administered 2021-06-24: 16:00:00 1000 mg via ORAL
  Filled 2021-06-24: qty 2

## 2021-06-24 MED ORDER — AMOXICILLIN-POT CLAVULANATE 875-125 MG PO TABS
1.0000 | ORAL_TABLET | Freq: Once | ORAL | Status: AC
  Administered 2021-06-24: 18:00:00 1 via ORAL
  Filled 2021-06-24: qty 1

## 2021-06-24 MED ORDER — FENTANYL CITRATE PF 50 MCG/ML IJ SOSY
50.0000 ug | PREFILLED_SYRINGE | Freq: Once | INTRAMUSCULAR | Status: AC
  Administered 2021-06-24: 18:00:00 50 ug via INTRAVENOUS
  Filled 2021-06-24: qty 1

## 2021-06-24 MED ORDER — AMOXICILLIN-POT CLAVULANATE 875-125 MG PO TABS: 1.0000 | ORAL_TABLET | Freq: Two times a day (BID) | ORAL | 0 refills | Status: DC

## 2021-06-24 MED ORDER — ONDANSETRON HCL 4 MG/2ML IJ SOLN
4.0000 mg | Freq: Once | INTRAMUSCULAR | Status: AC
  Administered 2021-06-24: 18:00:00 4 mg via INTRAVENOUS
  Filled 2021-06-24: qty 2

## 2021-06-24 MED ORDER — ACETAMINOPHEN 500 MG PO TABS
ORAL_TABLET | ORAL | Status: AC
  Filled 2021-06-24: qty 2

## 2021-06-24 NOTE — Discharge Instructions (Addendum)
Take Augmentin twice daily for the next 7 days. Follow-up with him with podiatry on Thursday as planned. Continue taking the gabapentin for the pain.

## 2021-06-24 NOTE — ED Notes (Signed)
Redraw of blood tubes completed and to the lab

## 2021-06-24 NOTE — ED Triage Notes (Signed)
Wound to right 2nd toe, seen by podiatry last week, scheduled to see him next week but states it is starting to look dark.   [place] noted per pt

## 2021-06-24 NOTE — ED Provider Notes (Signed)
[facility] EMERGENCY DEPARTMENT Provider Note   CSN: [id number] Arrival date & time: 06/24/21  1241     History Chief Complaint  Patient presents with   Toe Pain    Kristen Garrett is a 60 y.o. female.  HPI  Patient with history of diabetes and toe infection followed by podiatry presents due to toe pain.  The right second toe has been painful for the last few days, reports yesterday noticing some drainage.  There is now skin discoloration to the ventral side of the foot which was not there a few days ago.  She does have follow-up with podiatry this Thursday, is not been on antibiotics for some time.  She was previously on Augmentin which seemed to help with the infection.  Past Medical History:  Diagnosis Date   Diabetes mellitus    DVT of lower limb, acute (HCC) Age 38   Went on blood thinner for a few months   Hyperlipidemia    Vertigo     Patient Active Problem List   Diagnosis Date Noted   Muscle cramps 05/20/2021   Vitamin D deficiency 01/14/2019   Hyperlipidemia 07/20/2015   Xerosis of skin 07/13/2014   Onychomycosis 07/13/2014   Unspecified venous (peripheral) insufficiency 02/14/2013   Diabetic peripheral neuropathy ([place]) 11/03/2012   Left breast lump 06/01/2012   Enlarged thyroid 10/11/2011   Healthcare maintenance 10/11/2011   ALLERGIC RHINITIS, SEASONAL 09/07/2009   Type II diabetes mellitus ([place]) 08/27/2006   Tobacco use 08/27/2006   Essential hypertension 08/27/2006    Past Surgical History:  Procedure Laterality Date   ENDOMETRIAL ABLATION W/ NOVASURE     TUBAL LIGATION       OB History     Gravida  3   Para  2   Term  1   Preterm  1   AB      Living  2      SAB      IAB      Ectopic      Multiple      Live Births              Family History  Problem Relation Age of Onset   Diabetes Mother    Hypertension Mother    Hyperlipidemia Mother    Diabetes Father    Hyperlipidemia Father    Diabetes Brother      Social History   Tobacco Use   Smoking status: Every Day    Packs/day: 0.50    Years: 40.00    Pack years: 20.00    Types: Cigarettes   Smokeless tobacco: Never  Substance Use Topics   Alcohol use: No    Alcohol/week: 0.0 standard drinks   Drug use: No    Home Medications Prior to Admission medications   Medication Sig Start Date End Date Taking? Authorizing Provider  acetaminophen (TYLENOL) 325 MG tablet Take 2 tablets (650 mg total) by mouth every 6 (six) hours as needed for moderate pain. 03/10/19   Madalyn Rob, MD  amoxicillin-clavulanate (AUGMENTIN) 875-125 MG tablet Take 1 tablet by mouth 2 (two) times daily. One po bid x 7 days 06/04/21   Ripley Fraise, MD  aspirin 81 MG chewable tablet Chew 1 tablet (81 mg total) by mouth daily. 04/05/20   Bloomfield, Carley D, DO  atorvastatin (LIPITOR) 40 MG tablet Take 1 tablet (40 mg total) by mouth daily. 05/20/21 05/20/22  Sanjuan Dame, MD  Blood Glucose Monitoring Suppl ([place]) w/Device KIT  1 Device by Does not apply route daily. 08/02/15   Zenia Resides, MD  cholecalciferol (VITAMIN D3) 25 MCG (1000 UNIT) tablet Take 1 tablet (1,000 Units total) by mouth daily. 04/05/20   Bloomfield, Carley D, DO  Continuous Blood Gluc Sensor (FREESTYLE LIBRE 14 DAY SENSOR) MISC USE AS DIRECTED TO CHECK BLOOD SUGAR FOUR TIMES DAILY 02/27/21   Marianna Payment, MD  dicyclomine (BENTYL) 20 MG tablet Take 1 tablet (20 mg total) by mouth 4 (four) times daily as needed (Abdominal cramping). 05/20/21   Sanjuan Dame, MD  empagliflozin (JARDIANCE) 10 MG TABS tablet Take 1 tablet (10 mg total) by mouth daily. 05/20/21   Sanjuan Dame, MD  gabapentin (NEURONTIN) 300 MG capsule Take 2 capsules (600 mg total) by mouth 3 (three) times daily. 05/20/21   Sanjuan Dame, MD  glucose blood (RELION GLUCOSE TEST STRIPS) test strip Use as instructed 08/02/15   Zenia Resides, MD  HYDROcodone-acetaminophen (NORCO/VICODIN) 5-325 MG  tablet Take 1 tablet by mouth every 6 (six) hours as needed for severe pain. 06/04/21   Ripley Fraise, MD  ibuprofen (ADVIL) 800 MG tablet Take 1 tablet (800 mg total) by mouth at bedtime as needed. 05/21/21   Sanjuan Dame, MD  Insulin Pen Needle 32G X 4 MM MISC Use to inject insulin one time daily. Dx E11.65 (Prefers BD Pen Needles) 04/05/20   Bloomfield, Dawson D, DO  Lancets Misc. (RELION LANCING DEVICE) KIT 1 Device by Does not apply route daily. 08/02/15   Zenia Resides, MD  lisinopril (ZESTRIL) 40 MG tablet Take 1 tablet by mouth once daily 05/20/21   Sanjuan Dame, MD  Multiple Vitamin (MULTIVITAMIN WITH MINERALS) TABS tablet Take 1 tablet by mouth daily.    [provider]  TRESIBA FLEXTOUCH 100 UNIT/ML FlexTouch Pen INJECT 30 UNITS SUBCUTANEOUSLY ONCE DAILY 02/27/21   Marianna Payment, MD    Allergies    Glipizide and Metformin and related  Review of Systems   Review of Systems  Constitutional:  Negative for fever.  Respiratory:  Negative for shortness of breath.   Cardiovascular:  Negative for chest pain.  Musculoskeletal:  Positive for joint swelling and myalgias.  Skin:  Positive for color change and wound.   Physical Exam Updated Vital Signs BP (!) 194/101    Pulse 85    Temp 97.6 F (36.4 C) (Oral)    Resp 18    Ht _0  (1.676 m)    Wt 92.5 kg    LMP 02/17/2015    SpO2 100%    BMI 32.91 kg/m   Physical Exam Vitals and nursing note reviewed. Exam conducted with a chaperone present.  Constitutional:      General: She is not in acute distress.    Appearance: Normal appearance.  HENT:     Head: Normocephalic and atraumatic.  Eyes:     General: No scleral icterus.    Extraocular Movements: Extraocular movements intact.     Pupils: Pupils are equal, round, and reactive to light.  Cardiovascular:     Pulses: Normal pulses.     Comments: DP and PT are 1+, weak but palpable Musculoskeletal:        General: Swelling and tenderness present. Normal range  of motion.  Skin:    Capillary Refill: Unable to check cap refill secondary to onychomycosis    Coloration: Skin is not jaundiced.     Comments: Skin as pictured below  Neurological:     Mental Status: She is alert. Mental  status is at baseline.     Coordination: Coordination normal.      ED Results / Procedures / Treatments   Labs (all labs ordered are listed, but only abnormal results are displayed) Labs Reviewed - No data to display  EKG None  Radiology No results found.  Procedures Procedures   Medications Ordered in ED Medications - No data to display  ED Course  I have reviewed the triage vital signs and the nursing notes.  Pertinent labs & imaging results that were available during my care of the patient were reviewed by me and considered in my medical decision making (see chart for details).    MDM Rules/Calculators/A&P                         This is a 60 year old female with history of poorly controlled diabetes and toe wound.  We will get an x-ray to evaluate for osteomyelitis, also check basic labs to check for significantly cytosis concerning for infectious process.  ESR and CRP also ordered.  Physical exam shows intact range of motion, unable to fully evaluate cap refill.  She has good DP and PT, she is not febrile.    CBC without significant leukocytosis, hyperglycemic.  Per chart review her last A1c was over 12. She is not in DKA.  X-ray does not show signs of further infection/osteomyelitis.  Given this work-up I think it is reasonable start the patient on antibiotics.  She has good follow-up podiatry, next point is Thursday.  I advised her to keep that appointment and to take the Augmentin in the meantime.  Patient discharged in stable condition.   Final Clinical Impression(s) / ED Diagnoses Final diagnoses:  None    Rx / DC Orders ED Discharge Orders     None        Sherrill Raring, Hershal Coria 06/24/21 1912    Godfrey Pick, MD 06/26/21 [phone]

## 2021-07-09 ENCOUNTER — Inpatient Hospital Stay (HOSPITAL_BASED_OUTPATIENT_CLINIC_OR_DEPARTMENT_OTHER): Admission: RE | Admit: 2021-07-09 | Payer: BLUE CROSS/BLUE SHIELD | Source: Ambulatory Visit

## 2021-07-15 ENCOUNTER — Ambulatory Visit (HOSPITAL_BASED_OUTPATIENT_CLINIC_OR_DEPARTMENT_OTHER): Payer: BLUE CROSS/BLUE SHIELD

## 2021-09-10 ENCOUNTER — Inpatient Hospital Stay (HOSPITAL_BASED_OUTPATIENT_CLINIC_OR_DEPARTMENT_OTHER): Admission: RE | Admit: 2021-09-10 | Payer: BLUE CROSS/BLUE SHIELD | Source: Ambulatory Visit

## 2021-09-17 NOTE — Progress Notes (Signed)
? ? ? ?Subjective:  ?CC: follow-up on diabetes ? ?HPI: ? ?[name] is a 61 yo F with pmh of diabetes, hypertension, HLD and presents today for follow-up on diabetes management. Please see problem based assessment and plan for additional details. ? ?Past Medical History:  ?Diagnosis Date  ? Diabetes mellitus   ? DVT of lower limb, acute ([place]) Age 61  ? Went on blood thinner for a few months  ? Hyperlipidemia   ? Vertigo   ? ? ?Current Outpatient Medications on File Prior to Visit  ?Medication Sig Dispense Refill  ? acetaminophen (TYLENOL) 325 MG tablet Take 2 tablets (650 mg total) by mouth every 6 (six) hours as needed for moderate pain. 30 tablet 0  ? aspirin 81 MG chewable tablet Chew 1 tablet (81 mg total) by mouth daily. 90 tablet 2  ? Blood Glucose Monitoring Suppl (RELION ULTIMA GLUCOSE SYSTEM) w/Device KIT 1 Device by Does not apply route daily. 1 kit 0  ? dicyclomine (BENTYL) 20 MG tablet Take 1 tablet (20 mg total) by mouth 4 (four) times daily as needed (Abdominal cramping). 20 tablet 0  ? empagliflozin (JARDIANCE) 10 MG TABS tablet Take 1 tablet (10 mg total) by mouth daily. 90 tablet 2  ? gabapentin (NEURONTIN) 300 MG capsule Take 2 capsules (600 mg total) by mouth 3 (three) times daily. 180 capsule 3  ? glucose blood (RELION GLUCOSE TEST STRIPS) test strip Use as instructed 100 each 12  ? ibuprofen (ADVIL) 800 MG tablet Take 1 tablet (800 mg total) by mouth at bedtime as needed. 30 tablet 0  ? Insulin Pen Needle 32G X 4 MM MISC Use to inject insulin one time daily. Dx E11.65 (Prefers BD Pen Needles) 100 each prn  ? Lancets Misc. (RELION LANCING DEVICE) KIT 1 Device by Does not apply route daily. 1 kit 0  ? lisinopril (ZESTRIL) 40 MG tablet Take 1 tablet by mouth once daily 90 tablet 3  ? Multiple Vitamin (MULTIVITAMIN WITH MINERALS) TABS tablet Take 1 tablet by mouth daily.    ? TRESIBA FLEXTOUCH 100 UNIT/ML FlexTouch Pen INJECT 30 UNITS SUBCUTANEOUSLY ONCE DAILY 15 mL 0  ? ?No current  facility-administered medications on file prior to visit.  ? ? ?Family History  ?Problem Relation Age of Onset  ? Diabetes Mother   ? Hypertension Mother   ? Hyperlipidemia Mother   ? Diabetes Father   ? Hyperlipidemia Father   ? Diabetes Brother   ? ? ?Social History  ? ?Socioeconomic History  ? Marital status: Married  ?  Spouse name: Not on file  ? Number of children: Not on file  ? Years of education: Not on file  ? Highest education level: Not on file  ?Occupational History  ? Occupation: Unemployed   ?Tobacco Use  ? Smoking status: Every Day  ?  Packs/day: 0.50  ?  Years: 40.00  ?  Pack years: 20.00  ?  Types: Cigarettes  ? Smokeless tobacco: Never  ?Substance and Sexual Activity  ? Alcohol use: No  ?  Alcohol/week: 0.0 standard drinks  ? Drug use: No  ? Sexual activity: Not on file  ?Other Topics Concern  ? Not on file  ?Social History Narrative  ? 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 Lives in [place] with her husband.   ? 2 grown daughters.   ?   ? ?Social Determinants of Health  ? ?Financial Resource Strain: Not on file  ?Food Insecurity: Not on file  ?Transportation  Needs: Not on file  ?Physical Activity: Not on file  ?Stress: Not on file  ?Social Connections: Not on file  ?Intimate Partner Violence: Not on file  ? ? ?Review of Systems: ?ROS negative except for what is noted on the assessment and plan. ? ?Objective:  ? ?Vitals:  ? 09/18/21 0856  ?BP: 129/69  ?Pulse: 91  ?Temp: 97.9 ?F (36.6 ?C)  ?TempSrc: Oral  ?SpO2: 100%  ?Weight: 204 lb 1.6 oz (92.6 kg)  ?Height: '5\' 6"'  (1.676 m)  ? ? ?Physical Exam: ?Gen: A&O x3 and in no apparent distress, well appearing and nourished. ?Neck: no masses or nodules, AROM intact. ?CV: RRR, no murmurs, S1/S2 presents  ?Resp: Clear to auscultation bilaterally  ?Abd: BS (+) x4, soft, non-tender abdomen, without hepatosplenomegaly or masses ?MSK: Grossly normal AROM and strength x4 extremities. 2+ dorsalis pedis pulses bilaterally, decreased sensation to  vibration in lateral portion of feet. ?Skin: good skin turgor, no rashes, unusual bruising, or prominent lesions.  ?Neuro: No focal deficits, grossly normal sensation and coordination.  ?Psych: Oriented x3 and responding appropriately. Intact memory, normal mood, judgement, affect, and insight.  ? ? ?Assessment & Plan:  ?Type II diabetes mellitus ([place]) ?Medications: empagliflozin 10 mg, Tresiba 30 units qd ?HgbA1c elevated at 12.3 in 11/22. She has been taking empagliflozin and injecting [place] daily. She has not been monitoring her blood sugars over the last few months, but continued to use insulin. I advised about importance of checking blood glucose prior to injecting insulin so that she does not cause low blood sugars. She states that she never has low blood sugars. She placed a CGM sensor last week and we reviewed glucose readings from past week. She is consistently in to 250 range. She also states that she started injecting her daughter Semaglutide last week because that medication had been working so well for her. ?A/P: ?Hgb A1c was improved from 12.3 to 9.7.  ?Start Semaglutide 0.25 mg once weekly injection ?Foot exam completed ?Follow-up in 4 weeks ?Dexcom sensor, receiver, and transmitter- patient is changing insurance and this is the one she was told would be covered. ? ?Essential hypertension ?Medications: lisinopril 40 mg ?BP Readings from Last 3 Encounters:  ?09/18/21 129/69  ?06/24/21 (!) 138/92  ?06/04/21 (!) 148/74  ? ?Blood pressure well controlled with lisinopril 40 mg ? ?Hyperlipidemia ?Medications: Atorvastatin 40 mg ?Patient states that she has not been taking atorvastatin because generic medication makes her nauseated. Prior ASCVD risk of 20%.  ?P: ?Refill for Lipitor (comments to pharmacy with brand name only) ?Repeat lipid panel at follow-up in 4 weeks ? ?Tobacco use ?Patient presents with tobacco use disorder.She has smoked for the last 20 years, about 1 pack per day. She has tried gum and  patches in the past and they have not helped. Also tried buproprion and she states that she had bad mood swings on this medication. She is open to trying other medications. ?P: ?Start Chantix  ? ?Patient discussed with Dr. Daryll Drown ? ? ?Christiana Fuchs, D.O. ?[facility]  PGY-1 ?Pager: [phone]  Phone: [phone] ?Date 09/18/2021  Time 6:00 PM  ?

## 2021-09-18 ENCOUNTER — Encounter: Payer: Self-pay | Admitting: Internal Medicine

## 2021-09-18 ENCOUNTER — Telehealth: Payer: Self-pay | Admitting: *Deleted

## 2021-09-18 ENCOUNTER — Other Ambulatory Visit (HOSPITAL_COMMUNITY): Payer: Self-pay

## 2021-09-18 ENCOUNTER — Ambulatory Visit (INDEPENDENT_AMBULATORY_CARE_PROVIDER_SITE_OTHER): Payer: Self-pay | Admitting: Internal Medicine

## 2021-09-18 ENCOUNTER — Other Ambulatory Visit: Payer: Self-pay

## 2021-09-18 VITALS — BP 129/69 | HR 91 | Temp 97.9°F | Ht 66.0 in | Wt 204.1 lb

## 2021-09-18 DIAGNOSIS — Z720 Tobacco use: Secondary | ICD-10-CM

## 2021-09-18 DIAGNOSIS — E1165 Type 2 diabetes mellitus with hyperglycemia: Secondary | ICD-10-CM

## 2021-09-18 DIAGNOSIS — I10 Essential (primary) hypertension: Secondary | ICD-10-CM

## 2021-09-18 DIAGNOSIS — Z794 Long term (current) use of insulin: Secondary | ICD-10-CM

## 2021-09-18 DIAGNOSIS — E785 Hyperlipidemia, unspecified: Secondary | ICD-10-CM

## 2021-09-18 LAB — GLUCOSE, CAPILLARY: Glucose-Capillary: 260 mg/dL — ABNORMAL HIGH (ref 70–99)

## 2021-09-18 LAB — POCT GLYCOSYLATED HEMOGLOBIN (HGB A1C): Hemoglobin A1C: 9.7 % — AB (ref 4.0–5.6)

## 2021-09-18 MED ORDER — DEXCOM G6 TRANSMITTER MISC: 1.0000 "application " | 0 refills | Status: DC

## 2021-09-18 MED ORDER — VARENICLINE TARTRATE 0.5 MG PO TABS: ORAL_TABLET | ORAL | 0 refills | Status: AC

## 2021-09-18 MED ORDER — VARENICLINE TARTRATE 0.5 MG PO TABS
ORAL_TABLET | ORAL | 0 refills | Status: DC
  Filled 2021-09-18: qty 249, 66d supply, fill #0

## 2021-09-18 MED ORDER — OZEMPIC (0.25 OR 0.5 MG/DOSE) 2 MG/1.5ML ~~LOC~~ SOPN
0.5000 mg | PEN_INJECTOR | SUBCUTANEOUS | 2 refills | Status: DC
  Filled 2021-09-18 – 2021-09-30 (×3): qty 1.5, 28d supply, fill #0
  Filled 2021-10-28: qty 1.5, 28d supply, fill #1

## 2021-09-18 MED ORDER — DEXCOM G6 RECEIVER DEVI: 1.0000 "application " | 0 refills | Status: DC

## 2021-09-18 MED ORDER — OZEMPIC (0.25 OR 0.5 MG/DOSE) 2 MG/1.5ML ~~LOC~~ SOPN
0.5000 mg | PEN_INJECTOR | SUBCUTANEOUS | 2 refills | Status: DC
  Filled 2021-09-18: qty 1.5, 28d supply, fill #0

## 2021-09-18 MED ORDER — DEXCOM G6 SENSOR MISC: 1.0000 "application " | 2 refills | Status: DC

## 2021-09-18 MED ORDER — ATORVASTATIN CALCIUM 40 MG PO TABS: 40.0000 mg | ORAL_TABLET | Freq: Every day | ORAL | 3 refills | Status: DC

## 2021-09-18 NOTE — Assessment & Plan Note (Signed)
Patient presents with tobacco use disorder.She has smoked for the last 20 years, about 1 pack per day. She has tried gum and patches in the past and they have not helped. Also tried buproprion and she states that she had bad mood swings on this medication. She is open to trying other medications. ?P: ?Start Chantix ?

## 2021-09-18 NOTE — Assessment & Plan Note (Addendum)
Medications: empagliflozin 10 mg, Tresiba 30 units qd ?HgbA1c elevated at 12.3 in 11/22. She has been taking empagliflozin and injecting [place] daily. She has not been monitoring her blood sugars over the last few months, but continued to use insulin. I advised about importance of checking blood glucose prior to injecting insulin so that she does not cause low blood sugars. She states that she never has low blood sugars. She placed a CGM sensor last week and we reviewed glucose readings from past week. She is consistently in to 250 range. She also states that she started injecting her daughter Semaglutide last week because that medication had been working so well for her. ?A/P: ?Hgb A1c was improved from 12.3 to 9.7.  ?Start Semaglutide 0.25 mg once weekly injection ?Foot exam completed ?Follow-up in 4 weeks ?Dexcom sensor, receiver, and transmitter- patient is changing insurance and this is the one she was told would be covered. ?

## 2021-09-18 NOTE — Telephone Encounter (Signed)
Received call from Sylvarena at [facility]. States patient only has [employer] and this will not cover Ozempic. Please resend Rx with IM Program in comments. It will be $10 co-pay. ?

## 2021-09-18 NOTE — Patient Instructions (Addendum)
[name], it was a pleasure seeing you today! ? ?Today we discussed: ?Diabetes ?Please continue taking jardiance and tresiba 30 units once daily. I have sent in the other sensor that your insurance will cover. I have also sent in Barlow, inject 0.25 mg 1 time weekly. I am referring you to donna our dietician in clinic. Follow-up in 4 weeks. I am checking A1c and will call with results. ? ?Cholesterol ?I sent in a refill for Lipitor 40 mg. We will recheck cholesterol at follow-up appointment in 4 weeks. ? ?Tobacco ?Please take Chantix. This medication can help decrease nicotine cravings. You will take 0.5 mg once daily for 3 days, the 0.5 twice daily for 4 days then increase amount to 1 mg twice daily.  ? ?I have ordered the following labs today: ? ?Lab Orders    ?     Hemoglobin A1c    ?  ? ?I will call if any are abnormal. All of your labs can be accessed through "My Chart" ?  ?My Chart Access: ?[email or web address] ? ? ?Referrals ordered today:  ? ?Referral Orders  ?No referral(s) requested today  ?  ? ?I have ordered the following medication/changed the following medications:  ? ?Stop the following medications: ?Medications Discontinued During This Encounter  ?Medication Reason  ? amoxicillin-clavulanate (AUGMENTIN) 875-125 MG tablet Completed Course  ? HYDROcodone-acetaminophen (NORCO/VICODIN) 5-325 MG tablet Completed Course  ? cholecalciferol (VITAMIN D3) 25 MCG (1000 UNIT) tablet Completed Course  ? Continuous Blood Gluc Sensor (FREESTYLE LIBRE 14 DAY SENSOR) MISC   ? varenicline (CHANTIX) 0.5 MG tablet   ?  ? ?Start the following medications: ?Meds ordered this encounter  ?Medications  ? Semaglutide,0.25 or 0.5MG /DOS, (OZEMPIC, 0.25 OR 0.5 MG/DOSE,) 2 MG/1.5ML SOPN  ?  Sig: Inject 0.5 mg into the skin once a week.  ?  Dispense:  1.5 mL  ?  Refill:  2  ? Continuous Blood Gluc Receiver ([place]) DEVI  ?  Sig: 1 application. by Does not apply route  continuous.  ?  Dispense:  1 each  ?  Refill:  0  ? Continuous Blood Gluc Sensor (DEXCOM G6 SENSOR) MISC  ?  Sig: 1 application. by Does not apply route every 14 (fourteen) days.  ?  Dispense:  3 each  ?  Refill:  2  ? Continuous Blood Gluc Transmit (DEXCOM G6 TRANSMITTER) MISC  ?  Sig: 1 application. by Does not apply route continuous.  ?  Dispense:  1 each  ?  Refill:  0  ? DISCONTD: varenicline (CHANTIX) 0.5 MG tablet  ?  Sig: Take 1 tablet (0.5 mg total) by mouth daily for 3 days, THEN 1 tablet (0.5 mg total) 2 (two) times daily for 3 days, THEN 2 tablets (1 mg total) 2 (two) times daily.  ?  Dispense:  249 tablet  ?  Refill:  0  ? varenicline (CHANTIX) 0.5 MG tablet  ?  Sig: Take 1 tablet (0.5 mg total) by mouth daily for 3 days, THEN 1 tablet (0.5 mg total) 2 (two) times daily for 3 days, THEN 2 tablets (1 mg total) 2 (two) times daily.  ?  Dispense:  249 tablet  ?  Refill:  0  ?  ? ?Follow-up:  4 weeks   ? ?Please make sure to arrive 15 minutes prior to your next appointment. If you arrive late, you may be asked to reschedule.  ? ?We look forward to seeing you next time. Please call our clinic  at [phone] if you have any questions or concerns. The best time to call is Monday-Friday from 9am-4pm, but there is someone available 24/7. If after hours or the weekend, call the main hospital number and ask for the Internal Medicine Resident On-Call. If you need medication refills, please notify your pharmacy one week in advance and they will send [place] a request. ? ?Thank you for letting us take part in your care. Wishing you the best! ? ?Thank you, ?Dr. Howie Ill ?[place]  ?

## 2021-09-18 NOTE — Assessment & Plan Note (Signed)
Medications: lisinopril 40 mg ?BP Readings from Last 3 Encounters:  ?09/18/21 129/69  ?06/24/21 (!) 138/92  ?06/04/21 (!) 148/74  ? ?Blood pressure well controlled with lisinopril 40 mg ?

## 2021-09-18 NOTE — Assessment & Plan Note (Signed)
Medications: Atorvastatin 40 mg ?Patient states that she has not been taking atorvastatin because generic medication makes her nauseated. Prior ASCVD risk of 20%.  ?P: ?Refill for Lipitor (comments to pharmacy with brand name only) ?Repeat lipid panel at follow-up in 4 weeks ?

## 2021-09-19 ENCOUNTER — Other Ambulatory Visit (HOSPITAL_COMMUNITY): Payer: Self-pay

## 2021-09-19 ENCOUNTER — Encounter: Payer: Self-pay | Admitting: Internal Medicine

## 2021-09-26 NOTE — Progress Notes (Signed)
Internal Medicine Clinic Attending  Case discussed with Dr. Masters  at the time of the visit.  We reviewed the resident's history and exam and pertinent patient test results.  I agree with the assessment, diagnosis, and plan of care documented in the resident's note.  

## 2021-09-27 ENCOUNTER — Other Ambulatory Visit (HOSPITAL_COMMUNITY): Payer: Self-pay

## 2021-09-30 ENCOUNTER — Other Ambulatory Visit (HOSPITAL_COMMUNITY): Payer: Self-pay

## 2021-09-30 ENCOUNTER — Other Ambulatory Visit: Payer: Self-pay | Admitting: Student

## 2021-09-30 DIAGNOSIS — R252 Cramp and spasm: Secondary | ICD-10-CM

## 2021-10-14 ENCOUNTER — Telehealth: Payer: Self-pay | Admitting: *Deleted

## 2021-10-21 ENCOUNTER — Telehealth: Payer: Self-pay

## 2021-10-21 NOTE — Telephone Encounter (Signed)
Pa was done by another staff for pt DEXCOM G6 SENSOR ... notice came back as denied  ?

## 2021-10-21 NOTE — Telephone Encounter (Signed)
Pa forms for pt ( OZEMPIC ) came through via fax from pt insurance was filled and sent back to fax # [phone] ?

## 2021-10-24 NOTE — Telephone Encounter (Signed)
Decision  ?Denied by insurance ?

## 2021-10-24 NOTE — Telephone Encounter (Signed)
DECISION : ? ? ? ? ? Insurance called back stating it was approved and will send a copy of approval to the office. ? ? ? ? ? ? ( WILL SEND COPY TO PHARMACY ALSO )   ?

## 2021-10-28 ENCOUNTER — Other Ambulatory Visit (HOSPITAL_COMMUNITY): Payer: Self-pay

## 2021-10-28 MED ORDER — OZEMPIC (0.25 OR 0.5 MG/DOSE) 2 MG/3ML ~~LOC~~ SOPN
0.5000 mg | PEN_INJECTOR | SUBCUTANEOUS | 1 refills | Status: DC
Start: 2021-09-18 — End: 2022-02-06
  Filled 2021-10-28: qty 3, 28d supply, fill #0

## 2021-12-22 ENCOUNTER — Encounter: Payer: Self-pay | Admitting: *Deleted

## 2022-02-03 ENCOUNTER — Emergency Department (HOSPITAL_COMMUNITY)
Admission: EM | Admit: 2022-02-03 | Discharge: 2022-02-04 | Disposition: A | Discharge status: Home / Self Care | Payer: Self-pay | Attending: Student | Admitting: Student

## 2022-02-03 ENCOUNTER — Other Ambulatory Visit: Payer: Self-pay

## 2022-02-03 ENCOUNTER — Encounter (HOSPITAL_COMMUNITY): Payer: Self-pay

## 2022-02-03 DIAGNOSIS — R112 Nausea with vomiting, unspecified: Secondary | ICD-10-CM | POA: Insufficient documentation

## 2022-02-03 DIAGNOSIS — R1013 Epigastric pain: Secondary | ICD-10-CM | POA: Insufficient documentation

## 2022-02-03 DIAGNOSIS — Z5321 Procedure and treatment not carried out due to patient leaving prior to being seen by health care provider: Secondary | ICD-10-CM | POA: Insufficient documentation

## 2022-02-03 LAB — CBC WITH DIFFERENTIAL/PLATELET
Abs Immature Granulocytes: 0.01 10*3/uL (ref 0.00–0.07)
Basophils Absolute: 0 10*3/uL (ref 0.0–0.1)
Basophils Relative: 0 %
Eosinophils Absolute: 0.1 10*3/uL (ref 0.0–0.5)
Eosinophils Relative: 1 %
HCT: 45.6 % (ref 36.0–46.0)
Hemoglobin: 14.2 g/dL (ref 12.0–15.0)
Immature Granulocytes: 0 %
Lymphocytes Relative: 32 %
Lymphs Abs: 3.2 10*3/uL (ref 0.7–4.0)
MCH: 25.2 pg — ABNORMAL LOW (ref 26.0–34.0)
MCHC: 31.1 g/dL (ref 30.0–36.0)
MCV: 81 fL (ref 80.0–100.0)
Monocytes Absolute: 0.7 10*3/uL (ref 0.1–1.0)
Monocytes Relative: 7 %
Neutro Abs: 5.9 10*3/uL (ref 1.7–7.7)
Neutrophils Relative %: 60 %
Platelets: 257 10*3/uL (ref 150–400)
RBC: 5.63 MIL/uL — ABNORMAL HIGH (ref 3.87–5.11)
RDW: 14.3 % (ref 11.5–15.5)
WBC: 9.9 10*3/uL (ref 4.0–10.5)
nRBC: 0 % (ref 0.0–0.2)

## 2022-02-03 LAB — URINALYSIS, ROUTINE W REFLEX MICROSCOPIC
Bacteria, UA: NONE SEEN
Bilirubin Urine: NEGATIVE
Glucose, UA: >=500 mg/dL — AB
Ketones, ur: NEGATIVE mg/dL
Leukocytes,Ua: NEGATIVE
Nitrite: NEGATIVE
Protein, ur: 100 mg/dL — AB
Specific Gravity, Urine: 1.022 (ref 1.005–1.030)
pH: 6 (ref 5.0–8.0)

## 2022-02-03 LAB — COMPREHENSIVE METABOLIC PANEL
ALT: 18 U/L (ref 0–44)
AST: 17 U/L (ref 15–41)
Albumin: 3.5 g/dL (ref 3.5–5.0)
Alkaline Phosphatase: 126 U/L (ref 38–126)
Anion gap: 7 (ref 5–15)
BUN: 22 mg/dL — ABNORMAL HIGH (ref 6–20)
CO2: 25 mmol/L (ref 22–32)
Calcium: 9.4 mg/dL (ref 8.9–10.3)
Chloride: 104 mmol/L (ref 98–111)
Creatinine, Ser: 1.14 mg/dL — ABNORMAL HIGH (ref 0.44–1.00)
GFR, Estimated: 55 mL/min — ABNORMAL LOW (ref >60)
Glucose, Bld: 337 mg/dL — ABNORMAL HIGH (ref 70–99)
Potassium: 4.3 mmol/L (ref 3.5–5.1)
Sodium: 136 mmol/L (ref 135–145)
Total Bilirubin: 0.4 mg/dL (ref 0.3–1.2)
Total Protein: 7.4 g/dL (ref 6.5–8.1)

## 2022-02-03 LAB — LIPASE, BLOOD: Lipase: 382 U/L — ABNORMAL HIGH (ref 11–51)

## 2022-02-03 MED ORDER — HYDROCODONE-ACETAMINOPHEN 5-325 MG PO TABS
1.0000 | ORAL_TABLET | Freq: Once | ORAL | Status: DC
Start: 2022-02-03 — End: 2022-02-04

## 2022-02-03 MED ORDER — ONDANSETRON 4 MG PO TBDP: 4.0000 mg | ORAL_TABLET | Freq: Once | ORAL | Status: DC

## 2022-02-03 NOTE — ED Provider Triage Note (Signed)
Emergency Medicine Provider Triage Evaluation Note  Kristen Garrett , a 61 y.o. female  was evaluated in triage.  Pt complains of epigastric pain which began 5 days ago.  Patient states her pain has been building over the past 5 days and she has been treating herself with Tylenol at home.  She states the pain increased today to a point where she felt that she needed to be evaluated at the emergency department.  She states that she had mild nausea prior to arrival at the emergency department but vomited upon arrival.  Patient states she was admitted in the past for pancreatitis.  She denies drinking alcohol throughout her life  Review of Systems  Positive: Epigastric pain, nausea, vomiting Negative: Chest pain, shortness of breath  Physical Exam  BP (!) 166/91 (BP Location: Right Arm)   Pulse 87   Temp 98.5 F (36.9 C) (Oral)   Resp 20   LMP 02/17/2015   SpO2 99%  Gen:   Awake, no distress   Resp:  Normal effort  MSK:   Moves extremities without difficulty  Other:  Epigastric tenderness  Medical Decision Making  Medically screening exam initiated at 9:58 PM.  Appropriate orders placed.  Lundynn Cohoon was informed that the remainder of the evaluation will be completed by another provider, this initial triage assessment does not replace that evaluation, and the importance of remaining in the ED until their evaluation is complete.     Pamala Duffel 02/03/22 2159

## 2022-02-03 NOTE — ED Triage Notes (Signed)
PT HAS BEEN HAVING 5 DAYS OF EPIGASTRIC PAIN. PT DOES NOT DRINK ALCOHOL BUT DOES HAVE HISTORY OF PANCREATITIS AND THIS FEELS SIMILAR. PT HAD ONE EPISODE OF NV TODAY. PT HAS EPIGASTRIC TENDERNESS. LBM YESTERDAY

## 2022-02-04 ENCOUNTER — Inpatient Hospital Stay (HOSPITAL_BASED_OUTPATIENT_CLINIC_OR_DEPARTMENT_OTHER)
Admission: EM | Admit: 2022-02-04 | Discharge: 2022-02-06 | DRG: 440 | Disposition: A | Discharge status: Home / Self Care | Payer: Self-pay | Attending: Internal Medicine | Admitting: Internal Medicine

## 2022-02-04 ENCOUNTER — Other Ambulatory Visit: Payer: Self-pay

## 2022-02-04 ENCOUNTER — Encounter (HOSPITAL_BASED_OUTPATIENT_CLINIC_OR_DEPARTMENT_OTHER): Payer: Self-pay | Admitting: Emergency Medicine

## 2022-02-04 DIAGNOSIS — E1121 Type 2 diabetes mellitus with diabetic nephropathy: Secondary | ICD-10-CM

## 2022-02-04 DIAGNOSIS — I10 Essential (primary) hypertension: Secondary | ICD-10-CM | POA: Diagnosis present

## 2022-02-04 DIAGNOSIS — Z79899 Other long term (current) drug therapy: Secondary | ICD-10-CM

## 2022-02-04 DIAGNOSIS — F17210 Nicotine dependence, cigarettes, uncomplicated: Secondary | ICD-10-CM | POA: Diagnosis present

## 2022-02-04 DIAGNOSIS — E1165 Type 2 diabetes mellitus with hyperglycemia: Secondary | ICD-10-CM

## 2022-02-04 DIAGNOSIS — E1140 Type 2 diabetes mellitus with diabetic neuropathy, unspecified: Secondary | ICD-10-CM | POA: Diagnosis present

## 2022-02-04 DIAGNOSIS — E785 Hyperlipidemia, unspecified: Secondary | ICD-10-CM | POA: Diagnosis present

## 2022-02-04 DIAGNOSIS — K859 Acute pancreatitis without necrosis or infection, unspecified: Principal | ICD-10-CM | POA: Diagnosis present

## 2022-02-04 DIAGNOSIS — Z634 Disappearance and death of family member: Secondary | ICD-10-CM

## 2022-02-04 DIAGNOSIS — Z794 Long term (current) use of insulin: Secondary | ICD-10-CM

## 2022-02-04 DIAGNOSIS — Z7984 Long term (current) use of oral hypoglycemic drugs: Secondary | ICD-10-CM

## 2022-02-04 DIAGNOSIS — Z9851 Tubal ligation status: Secondary | ICD-10-CM

## 2022-02-04 DIAGNOSIS — Z823 Family history of stroke: Secondary | ICD-10-CM

## 2022-02-04 DIAGNOSIS — Z888 Allergy status to other drugs, medicaments and biological substances status: Secondary | ICD-10-CM

## 2022-02-04 HISTORY — DX: Acute pancreatitis without necrosis or infection, unspecified: K85.90

## 2022-02-04 LAB — MRSA NEXT GEN BY PCR, NASAL: MRSA by PCR Next Gen: NOT DETECTED

## 2022-02-04 LAB — LIPID PANEL
Cholesterol: 223 mg/dL — ABNORMAL HIGH (ref 0–200)
HDL: 32 mg/dL — ABNORMAL LOW (ref >40)
LDL Cholesterol: 116 mg/dL — ABNORMAL HIGH (ref 0–99)
Total CHOL/HDL Ratio: 7 RATIO
Triglycerides: 373 mg/dL — ABNORMAL HIGH (ref <150)
VLDL: 75 mg/dL — ABNORMAL HIGH (ref 0–40)

## 2022-02-04 LAB — GLUCOSE, CAPILLARY: Glucose-Capillary: 178 mg/dL — ABNORMAL HIGH (ref 70–99)

## 2022-02-04 LAB — HIV ANTIBODY (ROUTINE TESTING W REFLEX): HIV Screen 4th Generation wRfx: NONREACTIVE

## 2022-02-04 MED ORDER — ONDANSETRON HCL 4 MG/2ML IJ SOLN
4.0000 mg | Freq: Once | INTRAMUSCULAR | Status: AC
  Administered 2022-02-04: 4 mg via INTRAVENOUS
  Filled 2022-02-04: qty 2

## 2022-02-04 MED ORDER — LISINOPRIL 20 MG PO TABS
40.0000 mg | ORAL_TABLET | Freq: Every day | ORAL | Status: DC
  Administered 2022-02-04 – 2022-02-06 (×3): 40 mg via ORAL
  Filled 2022-02-04 (×3): qty 2

## 2022-02-04 MED ORDER — ENOXAPARIN SODIUM 40 MG/0.4ML IJ SOSY
40.0000 mg | PREFILLED_SYRINGE | INTRAMUSCULAR | Status: DC
  Administered 2022-02-04 – 2022-02-05 (×2): 40 mg via SUBCUTANEOUS
  Filled 2022-02-04 (×2): qty 0.4

## 2022-02-04 MED ORDER — GABAPENTIN 300 MG PO CAPS
600.0000 mg | ORAL_CAPSULE | Freq: Three times a day (TID) | ORAL | Status: DC
  Administered 2022-02-04 – 2022-02-06 (×6): 600 mg via ORAL
  Filled 2022-02-04 (×6): qty 2

## 2022-02-04 MED ORDER — ACETAMINOPHEN 500 MG PO TABS
1000.0000 mg | ORAL_TABLET | Freq: Four times a day (QID) | ORAL | Status: DC | PRN
Start: 2022-02-04 — End: 2022-02-06

## 2022-02-04 MED ORDER — ONDANSETRON HCL 4 MG PO TABS: 4.0000 mg | ORAL_TABLET | Freq: Four times a day (QID) | ORAL | Status: DC | PRN

## 2022-02-04 MED ORDER — SODIUM CHLORIDE 0.9 % IV SOLN: INTRAVENOUS | Status: DC

## 2022-02-04 MED ORDER — FENTANYL CITRATE PF 50 MCG/ML IJ SOSY
50.0000 ug | PREFILLED_SYRINGE | INTRAMUSCULAR | Status: DC | PRN
  Administered 2022-02-04 – 2022-02-06 (×6): 50 ug via INTRAVENOUS
  Filled 2022-02-04 (×8): qty 1

## 2022-02-04 MED ORDER — INSULIN ASPART 100 UNIT/ML IJ SOLN: 0.0000 [IU] | INTRAMUSCULAR | Status: DC

## 2022-02-04 MED ORDER — ORAL CARE MOUTH RINSE: 15.0000 mL | OROMUCOSAL | Status: DC | PRN

## 2022-02-04 MED ORDER — HYDROMORPHONE HCL 1 MG/ML IJ SOLN
0.5000 mg | Freq: Once | INTRAMUSCULAR | Status: AC
Start: 2022-02-04 — End: 2022-02-04
  Administered 2022-02-04: 0.5 mg via INTRAVENOUS
  Filled 2022-02-04: qty 1

## 2022-02-04 MED ORDER — INSULIN GLARGINE-YFGN 100 UNIT/ML ~~LOC~~ SOLN
15.0000 [IU] | Freq: Every day | SUBCUTANEOUS | Status: DC
Start: 2022-02-04 — End: 2022-02-06
  Administered 2022-02-04 – 2022-02-05 (×2): 15 [IU] via SUBCUTANEOUS
  Filled 2022-02-04 (×3): qty 0.15

## 2022-02-04 MED ORDER — ATORVASTATIN CALCIUM 40 MG PO TABS
40.0000 mg | ORAL_TABLET | Freq: Every day | ORAL | Status: DC
  Filled 2022-02-04 (×2): qty 1

## 2022-02-04 MED ORDER — LIDOCAINE 5 % EX PTCH
2.0000 | MEDICATED_PATCH | CUTANEOUS | Status: DC
  Filled 2022-02-04 (×2): qty 2

## 2022-02-04 MED ORDER — ASPIRIN 81 MG PO CHEW
81.0000 mg | CHEWABLE_TABLET | Freq: Every day | ORAL | Status: DC
  Administered 2022-02-04 – 2022-02-06 (×3): 81 mg via ORAL
  Filled 2022-02-04 (×3): qty 1

## 2022-02-04 MED ORDER — LACTATED RINGERS IV SOLN: INTRAVENOUS | Status: DC

## 2022-02-04 MED ORDER — FENTANYL CITRATE PF 50 MCG/ML IJ SOSY
50.0000 ug | PREFILLED_SYRINGE | Freq: Once | INTRAMUSCULAR | Status: AC
  Administered 2022-02-04: 50 ug via INTRAVENOUS
  Filled 2022-02-04: qty 1

## 2022-02-04 MED ORDER — ONDANSETRON HCL 4 MG/2ML IJ SOLN
4.0000 mg | Freq: Four times a day (QID) | INTRAMUSCULAR | Status: DC | PRN
  Administered 2022-02-04: 4 mg via INTRAVENOUS
  Filled 2022-02-04: qty 2

## 2022-02-04 MED ORDER — LACTATED RINGERS IV BOLUS
1000.0000 mL | Freq: Once | INTRAVENOUS | Status: AC
Start: 2022-02-04 — End: 2022-02-05
  Administered 2022-02-04: 1000 mL via INTRAVENOUS

## 2022-02-04 NOTE — ED Notes (Signed)
Pt refused to have  finger stuck for CBG

## 2022-02-04 NOTE — ED Notes (Signed)
Pt refused CBG. RN notified.

## 2022-02-04 NOTE — ED Notes (Signed)
Attempted to call report, no answer

## 2022-02-04 NOTE — H&P (Signed)
Date: 02/04/2022               Patient Name:  Kristen Garrett MRN: [id number]  DOB: August 28, 1960 Age / Sex: 61 y.o., female   PCP: Olegario Messier, MD         Medical Service: Internal Medicine Teaching Service         Attending Physician: Dr. Inez Catalina, MD    First Contact: Rocky Morel, DO      Pager: Ottis Stain [phone]      Second Contact: Thalia Bloodgood, DO     Pager: Theresa Duty [phone]        After Hours (After 5p/  First Contact Pager: [phone]  weekends / holidays): Second Contact Pager: [phone]   SUBJECTIVE   Chief Complaint: Upper abdominal pain  History of Present Illness:  Kristen Garrett is a 61 year old female with a past medical history of T2DM with neuropathy, HTN, and HLD who presents with 5 days of epigastric abdominal pain and 1 day of nausea and vomiting.  She states this pain is very similar to the pain she had when she was diagnosed with pancreatitis and admitted to this hospital 2 years ago.  She had 1 episode of vomiting when she first presented to the emergency room but has not had any nausea or vomiting since then.  She denies any recent illnesses, cough, shortness of breath, chest pain, change in her stools or urinary habits.  She states that her last episode she thinks was triggered by stress and that she is under a lot of stress right now after her step daughter passed away recently.  She had been prescribed Ozempic for her diabetes but had run of this medication and her last dose was over a month ago.  She states that she did well as medication was able to decrease her basal insulin to almost 0 while on Ozempic.  At that time she had also stopped taking Chantix because it was not working very well for her.   ED Course: Sent from [facility] and was given Zofran, fentanyl, and Dilaudid there with improvement in her pain and initial nausea.  Meds:  Lisinopril 40 mg daily Tresiba 15 U Jardiance 10 mg Ozempic last dose 1.5 months ago Gabapentin  600 mg 3 times a day Multivitamin Aspirin 81 mg Atorvastatin 40 mg daily  Past Medical History Diabetic neuropathy Insulin dependent type 2 diabetes  Past Surgical History:  Procedure Laterality Date   ENDOMETRIAL ABLATION W/ NOVASURE     TUBAL LIGATION      Social:  Lives With: Husband, grandchildren Occupation: Occupational psychologist for [employer]  Support: Family in the area Level of Function: Able to perform ADL/IADL PCP: Jason Fila Nooroudin IMTS Substances: Denies alcohol use. Tobacco use 1/2 PPD for 45 years ago. No other substance use.   Family History:  Mother had CVA Eldest daugther with CVA  Allergies: Allergies as of 02/04/2022 - Review Complete 02/04/2022  Allergen Reaction Noted   Glipizide Diarrhea 08/02/2015   Metformin and related Diarrhea 08/02/2015    Review of Systems: A complete ROS was negative except as per HPI.   OBJECTIVE:   Physical Exam: Blood pressure 125/78, pulse 75, temperature (!) 97.4 F (36.3 C), temperature source Oral, resp. rate 15, height 5\' 6"  (1.676 m), weight 92.5 kg, last menstrual period 02/17/2015, SpO2 98 %.  Constitutional: well-appearing female sitting in bed, in no acute distress HENT: normocephalic atraumatic Eyes: conjunctiva non-erythematous, no scleral icterus Neck: supple Cardiovascular: regular rate and  rhythm, no m/r/g Pulmonary/Chest: normal work of breathing on room air, lungs clear to auscultation bilaterally Abdominal: Mildly distended with tenderness to palpation primarily in the epigastric region but also in the bilateral upper abdomen with voluntary guarding, no rigidity MSK: normal bulk and tone Neurological: alert & oriented x 3 Skin: warm and dry  Labs: CBC    Component Value Date/Time   WBC 9.9 02/03/2022 2207   RBC 5.63 (H) 02/03/2022 2207   HGB 14.2 02/03/2022 2207   HGB 14.7 05/20/2021 1213   HCT 45.6 02/03/2022 2207   HCT 46.0 05/20/2021 1213   PLT 257 02/03/2022 2207   PLT 215  05/20/2021 1213   MCV 81.0 02/03/2022 2207   MCV 78 (L) 05/20/2021 1213   MCH 25.2 (L) 02/03/2022 2207   MCHC 31.1 02/03/2022 2207   RDW 14.3 02/03/2022 2207   RDW 15.1 05/20/2021 1213   LYMPHSABS 3.2 02/03/2022 2207   LYMPHSABS 2.7 05/20/2021 1213   MONOABS 0.7 02/03/2022 2207   EOSABS 0.1 02/03/2022 2207   EOSABS 0.1 05/20/2021 1213   BASOSABS 0.0 02/03/2022 2207   BASOSABS 0.0 05/20/2021 1213     CMP     Component Value Date/Time   NA 136 02/03/2022 2207   NA 139 05/20/2021 1213   K 4.3 02/03/2022 2207   CL 104 02/03/2022 2207   CO2 25 02/03/2022 2207   GLUCOSE 337 (H) 02/03/2022 2207   BUN 22 (H) 02/03/2022 2207   BUN 25 (H) 05/20/2021 1213   CREATININE 1.14 (H) 02/03/2022 2207   CREATININE 0.81 02/14/2016 1531   CALCIUM 9.4 02/03/2022 2207   PROT 7.4 02/03/2022 2207   PROT 6.6 10/16/2020 1116   ALBUMIN 3.5 02/03/2022 2207   ALBUMIN 3.9 10/16/2020 1116   AST 17 02/03/2022 2207   ALT 18 02/03/2022 2207   ALKPHOS 126 02/03/2022 2207   BILITOT 0.4 02/03/2022 2207   BILITOT <0.2 10/16/2020 1116   GFRNONAA 55 (L) 02/03/2022 2207   GFRNONAA >89 11/20/2014 1541   GFRAA 74 04/05/2020 1047   GFRAA >89 11/20/2014 1541    Imaging: None  ASSESSMENT & PLAN:   Assessment & Plan by Problem: Principal Problem:   Acute pancreatitis Active Problems:   Pancreatitis   Kristen Garrett is a 61 y.o. 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  Does not to be appear to be a severe case of pancreatitis but there does not seem to be an known precipitant at this time.  She was admitted for similar episode in September 2020 and was diagnosed with gastroparesis and plan a trial  of Reglan.  Triglycerides 373. - IV fluid rehydration with LR 125 mL/hr after LR 1 L bolus - Zofran and Tylenol as needed - Advance diet as tolerated  Type 2 diabetes mellitus with neuropathy Patient is on Tresiba 15 units at home as well as Jardiance 10 mg and gabapentin 600 mg 3 times a day.  She was previously on Ozempic but ran out of his medications about a month and a half ago.  A1c of 9.7 in 08/2021.  We will continue her home meds during this admission.  She requested that capillary blood glucoses be kept to a minimum because she does  not like being stuck. -Morning BMP -Semglee 15 units  Hyperlipidemia Continue home atorvastatin 40 mg daily  Diet:  Full liquid diet at the moment but may advance as tolerated VTE: Enoxaparin IVF: LR,125cc/hr Code: Full  Prior to Admission Living Arrangement: Home, living with husband Anticipated Discharge Location: Home Barriers to Discharge: None  Dispo: Admit patient to Observation with expected length of stay less than 2 midnights.  Signed: Belva Agee, MD Internal Medicine Resident PGY-3  02/04/2022, 3:17 PM

## 2022-02-04 NOTE — ED Triage Notes (Signed)
Pt c/o epigastric abd pain x 5 days with nausea and 1 episode of emesis, pt was at [facility] to be seen for same but LBS; abd is tender upon palpation, pt endorses hx of non-acholic pancreatitis, denies alcohol use

## 2022-02-04 NOTE — ED Notes (Signed)
Report given to Lydia RN.

## 2022-02-04 NOTE — ED Notes (Signed)
ED Provider at bedside. 

## 2022-02-04 NOTE — ED Notes (Signed)
Patient verified that dilaudid was administered. Had to manually scan med.

## 2022-02-04 NOTE — ED Provider Notes (Signed)
[facility] EMERGENCY DEPARTMENT Provider Note   CSN: [id number] Arrival date & time: 02/04/22  0225     History  Chief Complaint  Patient presents with   Abdominal Pain    Kristen Garrett is a 61 y.o. female.  The history is provided by the patient.  Abdominal Pain Pain location:  RUQ and LUQ Pain radiates to:  Does not radiate Pain severity:  Severe Onset quality:  Gradual Duration:  5 days Timing:  Constant Progression:  Unchanged Chronicity:  Recurrent Context: not alcohol use   Relieved by:  Nothing Worsened by:  Nothing Ineffective treatments:  None tried Associated symptoms: nausea   Associated symptoms: no anorexia and no fever   Risk factors: no alcohol abuse and not pregnant   Patient with a h/o pancreatitis last admitted in 2020 presents to the ED with Upper abdominal pain x 5 days with associated nausea.      Home Medications Prior to Admission medications   Medication Sig Start Date End Date Taking? Authorizing Provider  acetaminophen (TYLENOL) 325 MG tablet Take 2 tablets (650 mg total) by mouth every 6 (six) hours as needed for moderate pain. 03/10/19   Madalyn Rob, MD  aspirin 81 MG chewable tablet Chew 1 tablet (81 mg total) by mouth daily. 04/05/20   Bloomfield, Carley D, DO  atorvastatin (LIPITOR) 40 MG tablet Take 1 tablet (40 mg total) by mouth daily. 09/18/21 09/18/22  Masters, Katie, DO  Blood Glucose Monitoring Suppl (RELION ULTIMA GLUCOSE SYSTEM) w/Device KIT 1 Device by Does not apply route daily. 08/02/15   Zenia Resides, MD  Continuous Blood Gluc Receiver (DEXCOM G6 RECEIVER) DEVI 1 application. by Does not apply route continuous. 09/18/21   Masters, Katie, DO  Continuous Blood Gluc Sensor (DEXCOM G6 SENSOR) MISC 1 application. by Does not apply route every 14 (fourteen) days. 09/18/21   Masters, Katie, DO  Continuous Blood Gluc Transmit (DEXCOM G6 TRANSMITTER) MISC 1 application. by Does not apply route continuous. 09/18/21   Masters,  Katie, DO  dicyclomine (BENTYL) 20 MG tablet Take 1 tablet (20 mg total) by mouth 4 (four) times daily as needed (Abdominal cramping). 05/20/21   Sanjuan Dame, MD  empagliflozin (JARDIANCE) 10 MG TABS tablet Take 1 tablet (10 mg total) by mouth daily. 05/20/21   Sanjuan Dame, MD  gabapentin (NEURONTIN) 300 MG capsule Take 2 capsules (600 mg total) by mouth 3 (three) times daily. 05/20/21   Sanjuan Dame, MD  glucose blood (RELION GLUCOSE TEST STRIPS) test strip Use as instructed 08/02/15   Zenia Resides, MD  ibuprofen (ADVIL) 800 MG tablet TAKE 1 TABLET BY MOUTH AT BEDTIME AS NEEDED 10/04/21   Wayland Denis, MD  Insulin Pen Needle 32G X 4 MM MISC Use to inject insulin one time daily. Dx E11.65 (Prefers BD Pen Needles) 04/05/20   Bloomfield, Hokah D, DO  Lancets Misc. (RELION LANCING DEVICE) KIT 1 Device by Does not apply route daily. 08/02/15   Zenia Resides, MD  lisinopril (ZESTRIL) 40 MG tablet Take 1 tablet by mouth once daily 05/20/21   Sanjuan Dame, MD  Multiple Vitamin (MULTIVITAMIN WITH MINERALS) TABS tablet Take 1 tablet by mouth daily.    [provider]  Semaglutide,0.25 or 0.5MG/DOS, (OZEMPIC, 0.25 OR 0.5 MG/DOSE,) 2 MG/3ML SOPN Inject 0.5 mg into the skin once a week. 09/18/21   Masters, Katie, DO  TRESIBA FLEXTOUCH 100 UNIT/ML FlexTouch Pen INJECT 30 UNITS SUBCUTANEOUSLY ONCE DAILY 02/27/21   Marianna Payment, MD  Allergies    Glipizide and Metformin and related    Review of Systems   Review of Systems  Constitutional:  Negative for fever.  HENT:  Negative for facial swelling.   Eyes:  Negative for redness.  Respiratory:  Negative for wheezing and stridor.   Gastrointestinal:  Positive for abdominal pain and nausea. Negative for anorexia.  All other systems reviewed and are negative.   Physical Exam Updated Vital Signs BP 128/70 (BP Location: Right Arm)   Pulse 87   Temp 97.9 F (36.6 C) (Oral)   Resp 17   Ht _0  (1.676 m)   Wt 92.5 kg    LMP 02/17/2015   SpO2 95%   BMI 32.93 kg/m  Physical Exam Vitals and nursing note reviewed.  Constitutional:      General: She is not in acute distress.    Appearance: She is well-developed.  HENT:     Head: Normocephalic and atraumatic.  Eyes:     Pupils: Pupils are equal, round, and reactive to light.  Cardiovascular:     Rate and Rhythm: Normal rate and regular rhythm.     Pulses: Normal pulses.     Heart sounds: Normal heart sounds.  Pulmonary:     Effort: Pulmonary effort is normal. No respiratory distress.     Breath sounds: Normal breath sounds.  Abdominal:     General: Bowel sounds are increased. There is no distension.     Palpations: Abdomen is soft.     Tenderness: There is no abdominal tenderness. There is no guarding or rebound. Negative signs include Murphy's sign and McBurney's sign.  Genitourinary:    Vagina: No vaginal discharge.  Musculoskeletal:        General: Normal range of motion.     Cervical back: Neck supple.  Skin:    General: Skin is warm and dry.     Capillary Refill: Capillary refill takes less than 2 seconds.     Findings: No erythema or rash.  Neurological:     General: No focal deficit present.     Deep Tendon Reflexes: Reflexes normal.  Psychiatric:        Mood and Affect: Mood normal.        Behavior: Behavior normal.     ED Results / Procedures / Treatments   Labs (all labs ordered are listed, but only abnormal results are displayed) Labs Reviewed - No data to display  EKG None  Radiology No results found.  Procedures Procedures    Medications Ordered in ED Medications  0.9 %  sodium chloride infusion ( Intravenous New Bag/Given 02/04/22 0426)  fentaNYL (SUBLIMAZE) injection 50 mcg (50 mcg Intravenous Given 02/04/22 0428)  ondansetron (ZOFRAN) injection 4 mg (4 mg Intravenous Given 02/04/22 0426)    ED Course/ Medical Decision Making/ A&P                           Medical Decision Making Patient with a h/o pancreatitis  presents with same   Amount and/or Complexity of Data Reviewed External Data Reviewed: notes.    Details: previous notes reviewed Labs:     Details: see labs from [facility] from earlier in the Evening.  Notable for lipase of 382 and glucose of 337.  Urine is negative for UTI Discussion of management or test interpretation with external provider(s): Case d/w Dr. Alcario Drought, who will accept in transfer following clarification   Risk Prescription drug management. Parenteral controlled substances. Decision  regarding hospitalization. Risk Details: Will admit to triad given the patient is at [facility].  The patient appears reasonably stabilized for admission considering the current resources, flow, and capabilities available in the ED at this time, and I doubt any other [facility] requiring further screening and/or treatment in the ED prior to admission.     Final Clinical Impression(s) / ED Diagnoses Final diagnoses:  Acute pancreatitis, unspecified complication status, unspecified pancreatitis type    Rx / DC Orders ED Discharge Orders     None         Cohan Stipes, MD 02/04/22 [phone]

## 2022-02-04 NOTE — ED Notes (Signed)
Pt stated that "4 hours was too long of a wait" and "we are going to go to a different hospital". Pt signed out and has left.

## 2022-02-04 NOTE — ED Notes (Signed)
Report given to [facility] 

## 2022-02-05 ENCOUNTER — Encounter (HOSPITAL_COMMUNITY): Payer: Self-pay

## 2022-02-05 ENCOUNTER — Observation Stay (HOSPITAL_COMMUNITY): Payer: Medicaid Other

## 2022-02-05 DIAGNOSIS — E1140 Type 2 diabetes mellitus with diabetic neuropathy, unspecified: Secondary | ICD-10-CM

## 2022-02-05 DIAGNOSIS — Z794 Long term (current) use of insulin: Secondary | ICD-10-CM

## 2022-02-05 DIAGNOSIS — K858 Other acute pancreatitis without necrosis or infection: Secondary | ICD-10-CM

## 2022-02-05 LAB — BASIC METABOLIC PANEL
Anion gap: 6 (ref 5–15)
BUN: 11 mg/dL (ref 6–20)
CO2: 24 mmol/L (ref 22–32)
Calcium: 8.7 mg/dL — ABNORMAL LOW (ref 8.9–10.3)
Chloride: 110 mmol/L (ref 98–111)
Creatinine, Ser: 0.97 mg/dL (ref 0.44–1.00)
GFR, Estimated: >60 mL/min (ref >60)
Glucose, Bld: 178 mg/dL — ABNORMAL HIGH (ref 70–99)
Potassium: 4.6 mmol/L (ref 3.5–5.1)
Sodium: 140 mmol/L (ref 135–145)

## 2022-02-05 LAB — GLUCOSE, CAPILLARY: Glucose-Capillary: 139 mg/dL — ABNORMAL HIGH (ref 70–99)

## 2022-02-05 NOTE — Progress Notes (Signed)
HD#0 Subjective:   Summary: Kristen Garrett is a 61 year old female with a past medical history of T2DM with neuropathy, hypertension, and hyperlipidemia is presented with 5 days of epigastric pain with 1 episode of associated nausea and vomiting and was admitted for acute mild interstitial pancreatitis.  Overnight Events: No acute events  This morning patient is doing well and reports that although the pain is still there it is improving.  She is still hungry and has not had any nausea or vomiting with soft foods.  He would like to try to advance her diet this morning.  She expresses her dizziness and to go home today but is worried about the pain recurring or getting worse and would like to consider staying another night that she is feeling very well later in the day.  Objective:  Vital signs in last 24 hours: Vitals:   02/04/22 2004 02/04/22 2310 02/05/22 0321 02/05/22 0747  BP: (!) 156/79 138/78 120/65 121/77  Pulse: 73   70  Resp: _0 Temp: 98.3 F (36.8 C) 97.9 F (36.6 C) (!) 97.1 F (36.2 C) 97.6 F (36.4 C)  TempSrc: Oral Oral Axillary   SpO2: 97%   100%  Weight:      Height:       Supplemental O2: Room Air SpO2: 100 %   Physical Exam:  Constitutional: well-appearing female sitting in bed, in no acute distress HENT: normocephalic atraumatic Neck: supple Cardiovascular: regular rate and rhythm Pulmonary/Chest: normal work of breathing on room air, lungs clear to auscultation bilaterally Abdominal: soft, mildly distended and tender to palpation in the upper abdomen primarily in the epigastric and right upper quadrant area. MSK: normal bulk and tone, no lower extremity edema Neurological: alert & oriented x 3 Skin: warm and dry   Filed Weights   02/04/22 0258  Weight: 92.5 kg     Intake/Output Summary (Last 24 hours) at 02/05/2022 1121 Last data filed at 02/05/2022 0740 Gross per 24 hour  Intake 2401.66 ml  Output --  Net 2401.66 ml   Net IO Since  Admission: 2,401.66 mL [02/05/22 1121]  Pertinent Labs:    Latest Ref Rng & Units 02/03/2022   10:07 PM 06/24/2021    4:15 PM 05/20/2021   12:13 PM  CBC  WBC 4.0 - 10.5 K/uL 9.9  8.6  7.3   Hemoglobin 12.0 - 15.0 g/dL 14.2  14.4  14.7   Hematocrit 36.0 - 46.0 % 45.6  44.7  46.0   Platelets 150 - 400 K/uL 257  246  215        Latest Ref Rng & Units 02/05/2022    2:31 AM 02/03/2022   10:07 PM 06/24/2021    4:15 PM  CMP  Glucose 70 - 99 mg/dL 178  337  187   BUN 6 - 20 mg/dL _1 Creatinine 0.44 - 1.00 mg/dL 0.97  1.14  0.84   Sodium 135 - 145 mmol/L 140  136  139   Potassium 3.5 - 5.1 mmol/L 4.6  4.3  4.3   Chloride 98 - 111 mmol/L 110  104  105   CO2 22 - 32 mmol/L _2 Calcium 8.9 - 10.3 mg/dL 8.7  9.4  9.3   Total Protein 6.5 - 8.1 g/dL  7.4  7.8   Total Bilirubin 0.3 - 1.2 mg/dL  0.4  0.3   Alkaline Phos 38 - 126 U/L  126  122   AST 15 - 41 U/L  17  20   ALT 0 - 44 U/L  18  23     Imaging: No results found.  Assessment/Plan:   Principal Problem:   Acute pancreatitis Active Problems:   Pancreatitis   Patient Summary: Kristen Garrett is a 61 year old female with a past medical history of T2DM with neuropathy, hypertension, and hyperlipidemia is presented with 5 days of epigastric pain with 1 episode of associated nausea and vomiting and was admitted for acute mild interstitial pancreatitis.   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  Still no known precipitant with triglycerides of 373.  She was admitted for similar episode in September 2020 and was diagnosed with gastroparesis and was put on a trial of Reglan.  She is still having right greater than left upper quadrant pain but her pain is primarily epigastric.  Alk phos was normal but with 2 separate episodes of pancreatitis and continued right upper quadrant pain gallbladder pathology is still in  the differential. - IV fluid rehydration with LR 125 mL/hr after LR 1 L bolus - Zofran and Tylenol as needed - Advance diet as tolerated - Obtain right upper quadrant ultrasound   Type 2 diabetes mellitus with neuropathy Patient is on Tresiba 15 units at home as well as Jardiance 10 mg and gabapentin 600 mg 3 times a day.  She was previously on Ozempic but ran out of his medications about a month and a half ago.  A1c of 9.7 in 08/2021.  We will continue her home meds during this admission.  She requested that capillary blood glucoses be kept to a minimum because she does not like being stuck. -Morning BMP -Semglee 15 units   Hyperlipidemia Continue home atorvastatin 40 mg daily  Diet: Heart Healthy IVF: LR,125cc/hr VTE: Enoxaparin Code: Full    Dispo: Anticipated discharge to Home in 1 days pending continued improvement and right upper quadrant ultrasound.   [facility] Resident PGY-1 Pager: [phone]  Please contact the on call pager after 5 pm and on weekends at [phone].

## 2022-02-06 ENCOUNTER — Other Ambulatory Visit (HOSPITAL_COMMUNITY): Payer: Self-pay

## 2022-02-06 DIAGNOSIS — I10 Essential (primary) hypertension: Secondary | ICD-10-CM

## 2022-02-06 DIAGNOSIS — E785 Hyperlipidemia, unspecified: Secondary | ICD-10-CM

## 2022-02-06 DIAGNOSIS — E1165 Type 2 diabetes mellitus with hyperglycemia: Secondary | ICD-10-CM

## 2022-02-06 DIAGNOSIS — F17210 Nicotine dependence, cigarettes, uncomplicated: Secondary | ICD-10-CM

## 2022-02-06 LAB — GLUCOSE, CAPILLARY: Glucose-Capillary: 302 mg/dL — ABNORMAL HIGH (ref 70–99)

## 2022-02-06 LAB — BASIC METABOLIC PANEL
Anion gap: 4 — ABNORMAL LOW (ref 5–15)
BUN: 15 mg/dL (ref 6–20)
CO2: 25 mmol/L (ref 22–32)
Calcium: 8.5 mg/dL — ABNORMAL LOW (ref 8.9–10.3)
Chloride: 107 mmol/L (ref 98–111)
Creatinine, Ser: 1.11 mg/dL — ABNORMAL HIGH (ref 0.44–1.00)
GFR, Estimated: 57 mL/min — ABNORMAL LOW (ref >60)
Glucose, Bld: 337 mg/dL — ABNORMAL HIGH (ref 70–99)
Potassium: 4.5 mmol/L (ref 3.5–5.1)
Sodium: 136 mmol/L (ref 135–145)

## 2022-02-06 MED ORDER — ACETAMINOPHEN 500 MG PO TABS
1000.0000 mg | ORAL_TABLET | Freq: Four times a day (QID) | ORAL | 0 refills | Status: DC | PRN
  Filled 2022-02-06: qty 80, 10d supply, fill #0

## 2022-02-06 MED ORDER — EMPAGLIFLOZIN 10 MG PO TABS
10.0000 mg | ORAL_TABLET | Freq: Every day | ORAL | Status: DC
  Administered 2022-02-06: 10 mg via ORAL
  Filled 2022-02-06: qty 1

## 2022-02-06 MED ORDER — ONDANSETRON HCL 4 MG PO TABS
4.0000 mg | ORAL_TABLET | Freq: Four times a day (QID) | ORAL | 0 refills | Status: DC | PRN
  Filled 2022-02-06: qty 20, 5d supply, fill #0

## 2022-02-06 MED ORDER — TRESIBA FLEXTOUCH 100 UNIT/ML ~~LOC~~ SOPN
15.0000 [IU] | PEN_INJECTOR | Freq: Every day | SUBCUTANEOUS | 0 refills | Status: DC
  Filled 2022-02-06: qty 6, 40d supply, fill #0
  Filled 2022-03-12: qty 15, 100d supply, fill #0
  Filled 2022-03-13: qty 12, 80d supply, fill #0

## 2022-02-06 NOTE — Discharge Instructions (Signed)
You were hospitalized for pancreatitis. Thank you for allowing [place] to be part of your care.   We arranged for you to follow up at:  [facility] on 02/13/2022 at 9:30    Please note these changes made to your medications:   Please START taking:  Tresiba 20 units daily  Please STOP taking:  Ozempic  Please make sure to eat low-fat foods and stay hydrated as your pancreatitis continues to resolve.  Please call our clinic if you have any questions or concerns, we may be able to help and keep you from a long and expensive emergency room wait. Our clinic and after hours phone number is [phone], the best time to call is Monday through Friday 9 am to 4 pm but there is always someone available 24/7 if you have an emergency. If you need medication refills please notify your pharmacy one week in advance and they will send [place] a request.

## 2022-02-06 NOTE — Progress Notes (Signed)
Mobility Specialist Progress Note   02/06/22 1301  Mobility  Activity Ambulated independently in hallway  Level of Assistance Independent after set-up  Assistive Device None  Distance Ambulated (ft) 460 ft  Activity Response Tolerated well  $Mobility charge 1 Mobility   Patient received on EOB agreeable to participate in mobility. Ambulated in hallway independently with steady gait. Returned to room without complaint or incident. Was left back on EOB with all needs met, call bell in reach.   Holland Falling Mobility Specialist MS [facility] #:  [phone] Acute Rehab Office:  [phone]

## 2022-02-06 NOTE — Inpatient Diabetes Management (Signed)
Inpatient Diabetes Program Recommendations  AACE/ADA: New Consensus Statement on Inpatient Glycemic Control (2015)  Target Ranges:  Prepandial:   less than 140 mg/dL      Peak postprandial:   less than 180 mg/dL (1-2 hours)      Critically ill patients:  140 - 180 mg/dL   Lab Results  Component Value Date   GLUCAP 302 (H) 02/06/2022   HGBA1C 9.7 (A) 09/18/2021    Review of Glycemic Control  Diabetes history: type 2 Outpatient Diabetes medications: Tresiba 15-20 units daily, Jardiance 10 mg daily, Ozempic 0.5 mg weekly Current orders for Inpatient glycemic control: Semglee 15 units daily, Jardiance 10 mg daily  Inpatient Diabetes Program Recommendations:   Spoke with patient briefly as the internal medicine physicians were talking with patient. Patient states that she was taking [place] 15-20 units daily. She had just been ordered a Dexcom G6 transmitter and had left it at home. She has been on a Freestyle Libre CGM in the past. States that she only checks blood sugars once per day. Encouraged her to check blood sugars before meals until she is seen at the internal medicine clinic next week.   Discussed last HgbA1C of 9.7% on 09/18/21. Physicians will get another A1C  to update. The patient states that she had stopped taking Ozempic because it "made her stomach sour". She has been off of it for about 1 month. Physicians recommend that she not restart it due to her 2 occurrences of pancreatitis.   Patient is ready to go home. She will be seen in internal medicine clinic next week.  Smith Mince RN BSN CDE Diabetes Coordinator Pager: [phone]  8am-5pm

## 2022-02-06 NOTE — Discharge Summary (Signed)
Name: Kristen Garrett MRN: [id number] DOB: [birth year] 61 y.o. PCP: Drucie Opitz, MD  Date of Admission: 02/04/2022  2:48 AM Date of Discharge: 02/06/2022 Attending Physician: Dr. Daryll Drown  Discharge Diagnosis: Principal Problem:   Acute pancreatitis Active Problems:   Pancreatitis    Discharge Medications: Allergies as of 02/06/2022       Reactions   Glipizide Diarrhea   Metformin And Related Diarrhea        Medication List     STOP taking these medications    ibuprofen 200 MG tablet Commonly known as: ADVIL   ibuprofen 800 MG tablet Commonly known as: ADVIL   Ozempic (0.25 or 0.5 MG/DOSE) 2 MG/3ML Sopn Generic drug: Semaglutide(0.25 or 0.5MG /DOS)       TAKE these medications    acetaminophen 500 MG tablet Commonly known as: TYLENOL Take 2 tablets (1,000 mg total) by mouth every 6 (six) hours as needed for moderate pain. What changed:  medication strength how much to take   aspirin 81 MG chewable tablet Chew 1 tablet (81 mg total) by mouth daily.   atorvastatin 40 MG tablet Commonly known as: Lipitor Take 1 tablet (40 mg total) by mouth daily.   Dexcom G6 Receiver Devi 1 application. by Does not apply route continuous.   Dexcom G6 Sensor Misc 1 application. by Does not apply route every 14 (fourteen) days.   Dexcom G6 Transmitter Misc 1 application. by Does not apply route continuous.   dicyclomine 20 MG tablet Commonly known as: BENTYL Take 1 tablet (20 mg total) by mouth 4 (four) times daily as needed (Abdominal cramping).   empagliflozin 10 MG Tabs tablet Commonly known as: JARDIANCE Take 1 tablet (10 mg total) by mouth daily.   gabapentin 300 MG capsule Commonly known as: NEURONTIN Take 2 capsules (600 mg total) by mouth 3 (three) times daily.   glucose blood test strip Commonly known as: RELION GLUCOSE TEST STRIPS Use as instructed   Insulin Pen Needle 32G X 4 MM Misc Use to inject insulin one time daily. Dx E11.65 (Prefers BD Pen  Needles)   lisinopril 40 MG tablet Commonly known as: ZESTRIL Take 1 tablet by mouth once daily   multivitamin with minerals Tabs tablet Take 1 tablet by mouth daily.   ondansetron 4 MG tablet Commonly known as: ZOFRAN Take 1 tablet (4 mg total) by mouth every 6 (six) hours as needed for nausea.   ReliOn Lancing Device Kit 1 Device by Does not apply route daily.   ReliOn Ultima Glucose System w/Device Kit 1 Device by Does not apply route daily.   Tyler Aas FlexTouch 100 UNIT/ML FlexTouch Pen Generic drug: insulin degludec Inject 15 Units into the skin daily. What changed: See the new instructions.        Disposition and follow-up:   [name] was discharged from [facility] in Good condition.  At the hospital follow up visit please address:  1.  Follow-up:  a.  Resolution of pancreatitis with symptom control.    b.  Repeat A1c and will get blood glucose log, hopefully she will be able to pick up her Dexcom and bring good records.  She was continued on Tresiba 20 units daily and Jardiance 10 mg daily.  Adjust as necessary.   c.  Repeat blood pressure  2.  Labs / imaging needed at time of follow-up: CBC, BMP, A1c  3.  Pending labs/ test needing follow-up: None  4.  Medication Changes Tresiba 20 units daily  Follow-up  Appointments:  Follow-up Information     Nooruddin, Marlene Lard, MD. Go on 02/13/2022.   Why: He will see Dr. Romana Juniper Contact information: [place] [address] [phone]                 Hospital Course by problem list: Mild interstitial edematous pancreatitis likely idiopathic Presented with 5 days of epigastric abdominal pain and an episode of nausea and vomiting.  Very similar to episode of pancreatitis in 03/2019.  Her history and physical exam along with her elevated lipase of 382 were sufficient to make a diagnosis of pancreatitis.  Triglycerides of 373, no transaminitis no  hyperbilirubinemia, but she did take Ozempic but this was over a month and a half ago from her last dose. She was admitted for similar episode in September 2020 and was diagnosed with gastroparesis and was put on a trial of Reglan.  She had right upper quadrant pain along with her epigastric pain.  Alk phos was normal but with 2 separate episodes of pancreatitis and continued right upper quadrant pain we will get a right upper quadrant ultrasound that was normal.  She was treated with IV fluid rehydration with lactated Ringer's and given Zofran, Tylenol, and fentanyl for symptom control.  She was able to advance her diet rather quickly and was eating solid foods by the second day of admission.  She was discharged home on day 3 of admission in stable condition with all questions answered.   Type 2 diabetes mellitus with neuropathy Patient reports being on Tresiba 15 units at home as well as Jardiance 10 mg and gabapentin 600 mg 3 times a day.  Review showed that she was prescribed Tresiba 30 units daily.  She eventually told us that she was taking between 15 and 20 units daily.  She was previously on Ozempic but ran out of his medications about a month and a half ago.  A1c of 9.7 in 08/2021.  We continued on her reported home dose of 15 units insulin glargine.  She did have some high blood sugars with fasting blood glucose of 300 and then she revealed that she would take up to 20 units daily.  We did not restart her Jardiance until the third day so this may play a role.  On discharge we recommended that she pick up her Dexcom and make sure to take frequent logs until she follows up with our clinic next week.  We also recommended that she take Tresiba 20 units daily as her blood glucose was over 300 on 15 units.  That time the insulin dose can be adjusted based on her sugars.  Recommend repeat A1c at that time.   Hyperlipidemia We continued her home atorvastatin 40 mg daily.  Hypertension Patient had some high  blood pressures during admission with systolics above 973 despite being on lisinopril 40 mg daily which is her home dose.  This is likely due to the 4 L of IV fluids as her blood pressure normalized prior to discharge when fluids were discontinued.  Recommend repeat blood pressure at follow-up.   Discharge Subjective: Maryl Blalock is a 61 year old female with a past medical history of T2DM with neuropathy, hypertension, and hyperlipidemia is presented with 5 days of epigastric pain with 1 episode of associated nausea and vomiting and was admitted for acute mild interstitial pancreatitis.  Today she was sitting in bed comfortably and notes that her abdominal pain has continued to improve and although she still has some  nausea it comes in very short bouts and she has not gotten any of the as needed Zofran for it.  She expresses her desire to go home as she agrees with [place] that she is feeling better.  Discharge Exam:   BP (!) 162/89 (BP Location: Left Arm)   Pulse 69   Temp 98.4 F (36.9 C)   Resp 15   Ht $R'5\' 6"'ms$  (1.676 m)   Wt 92.5 kg   LMP 02/17/2015   SpO2 98%   BMI 32.93 kg/m  Constitutional: well-appearing female sitting in bed, in no acute distress HENT: normocephalic atraumatic Neck: supple Cardiovascular: regular rate and rhythm Pulmonary/Chest: normal work of breathing on room air, lungs clear to auscultation bilaterally Abdominal: soft, mildly distended and tender to palpation in the upper abdomen. MSK: normal bulk and tone, no lower extremity edema Neurological: alert & oriented x 3 Skin: warm and dry  Pertinent Labs, Studies, and Procedures:     Latest Ref Rng & Units 02/03/2022   10:07 PM 06/24/2021    4:15 PM 05/20/2021   12:13 PM  CBC  WBC 4.0 - 10.5 K/uL 9.9  8.6  7.3   Hemoglobin 12.0 - 15.0 g/dL 14.2  14.4  14.7   Hematocrit 36.0 - 46.0 % 45.6  44.7  46.0   Platelets 150 - 400 K/uL 257  246  215        Latest Ref Rng & Units 02/06/2022    2:44 AM 02/05/2022     2:31 AM 02/03/2022   10:07 PM  CMP  Glucose 70 - 99 mg/dL 337  178  337   BUN 6 - 20 mg/dL $Remove'15  11  22   'dBBHzKO$ Creatinine 0.44 - 1.00 mg/dL 1.11  0.97  1.14   Sodium 135 - 145 mmol/L 136  140  136   Potassium 3.5 - 5.1 mmol/L 4.5  4.6  4.3   Chloride 98 - 111 mmol/L 107  110  104   CO2 22 - 32 mmol/L $RemoveB'25  24  25   'wsTzhbxv$ Calcium 8.9 - 10.3 mg/dL 8.5  8.7  9.4   Total Protein 6.5 - 8.1 g/dL   7.4   Total Bilirubin 0.3 - 1.2 mg/dL   0.4   Alkaline Phos 38 - 126 U/L   126   AST 15 - 41 U/L   17   ALT 0 - 44 U/L   18     US Abdomen Limited RUQ (LIVER/GB)  Result Date: 02/05/2022 CLINICAL DATA:  Abdominal pain for the past week. EXAM: ULTRASOUND ABDOMEN LIMITED RIGHT UPPER QUADRANT COMPARISON:  CT abdomen pelvis dated Nov 01, 2020. FINDINGS: Gallbladder: No gallstones or wall thickening visualized. No sonographic Murphy sign noted by sonographer. Common bile duct: Diameter: 3 mm, normal. Liver: No focal lesion identified. Within normal limits in parenchymal echogenicity. Portal vein is patent on color Doppler imaging with normal direction of blood flow towards the liver. Other: None. IMPRESSION: 1. Normal right upper quadrant ultrasound. Electronically Signed   By: Titus Dubin M.D.   On: 02/05/2022 15:40     Discharge Instructions: Discharge Instructions     Call MD for:  difficulty breathing, headache or visual disturbances   Complete by: As directed    Call MD for:  extreme fatigue   Complete by: As directed    Call MD for:  persistant dizziness or light-headedness   Complete by: As directed    Call MD for:  persistant nausea and vomiting   Complete  by: As directed    Call MD for:  severe uncontrolled pain   Complete by: As directed    Call MD for:  temperature >100.4   Complete by: As directed    Diet - low sodium heart healthy   Complete by: As directed    Low fat diet while recovering from pancreatitis.   Increase activity slowly   Complete by: As directed        Signed: Johny Blamer, DO 02/06/2022, 2:57 PM   Pager: [phone]

## 2022-02-06 NOTE — Hospital Course (Addendum)
Mild interstitial edematous pancreatitis likely idiopathic Presented with 5 days of epigastric abdominal pain and an episode of nausea and vomiting.  Very similar to episode of pancreatitis in 03/2019.  Her history and physical exam along with her elevated lipase of 382 were sufficient to make a diagnosis of pancreatitis.  Triglycerides of 373, no transaminitis no hyperbilirubinemia, but she did take Ozempic but this was over a month and a half ago from her last dose. She was admitted for similar episode in September 2020 and was diagnosed with gastroparesis and was put on a trial of Reglan.  She had right upper quadrant pain along with her epigastric pain.  Alk phos was normal but with 2 separate episodes of pancreatitis and continued right upper quadrant pain we will get a right upper quadrant ultrasound that was normal.  She was treated with IV fluid rehydration with lactated Ringer's and given Zofran, Tylenol, and fentanyl for symptom control.  She was able to advance her diet rather quickly and was eating solid foods by the second day of admission.  She was discharged home on day 3 of admission in stable condition with all questions answered.   Type 2 diabetes mellitus with neuropathy Patient reports being on Tresiba 15 units at home as well as Jardiance 10 mg and gabapentin 600 mg 3 times a day.  Review showed that she was prescribed Tresiba 30 units daily.  She eventually told us that she was taking between 15 and 20 units daily.  She was previously on Ozempic but ran out of his medications about a month and a half ago.  A1c of 9.7 in 08/2021.  We continued on her reported home dose of 15 units insulin glargine.  She did have some high blood sugars with fasting blood glucose of 300 and then she revealed that she would take up to 20 units daily.  We did not restart her Jardiance until the third day so this may play a role.  On discharge we recommended that she pick up her Dexcom and make sure to take  frequent logs until she follows up with our clinic next week.  We also recommended that she take Tresiba 20 units daily as her blood glucose was over 300 on 15 units.  That time the insulin dose can be adjusted based on her sugars.  Recommend repeat A1c at that time.   Hyperlipidemia We continued her home atorvastatin 40 mg daily.  Hypertension Patient had some high blood pressures during admission with systolics above 504 despite being on lisinopril 40 mg daily which is her home dose.  This is likely due to the 4 L of IV fluids as her blood pressure normalized prior to discharge when fluids were discontinued.  Recommend repeat blood pressure at follow-up.

## 2022-02-13 ENCOUNTER — Ambulatory Visit (INDEPENDENT_AMBULATORY_CARE_PROVIDER_SITE_OTHER): Payer: Self-pay | Admitting: Internal Medicine

## 2022-02-13 ENCOUNTER — Other Ambulatory Visit (HOSPITAL_COMMUNITY): Payer: Self-pay

## 2022-02-13 ENCOUNTER — Encounter: Payer: Medicaid Other | Admitting: Dietician

## 2022-02-13 VITALS — BP 144/69 | HR 77 | Temp 98.1°F | Ht 66.0 in | Wt 198.9 lb

## 2022-02-13 DIAGNOSIS — I10 Essential (primary) hypertension: Secondary | ICD-10-CM

## 2022-02-13 DIAGNOSIS — E049 Nontoxic goiter, unspecified: Secondary | ICD-10-CM

## 2022-02-13 DIAGNOSIS — F17210 Nicotine dependence, cigarettes, uncomplicated: Secondary | ICD-10-CM

## 2022-02-13 DIAGNOSIS — Z794 Long term (current) use of insulin: Secondary | ICD-10-CM

## 2022-02-13 DIAGNOSIS — E119 Type 2 diabetes mellitus without complications: Secondary | ICD-10-CM

## 2022-02-13 DIAGNOSIS — K858 Other acute pancreatitis without necrosis or infection: Secondary | ICD-10-CM

## 2022-02-13 DIAGNOSIS — E1165 Type 2 diabetes mellitus with hyperglycemia: Secondary | ICD-10-CM

## 2022-02-13 DIAGNOSIS — E785 Hyperlipidemia, unspecified: Secondary | ICD-10-CM

## 2022-02-13 DIAGNOSIS — N179 Acute kidney failure, unspecified: Secondary | ICD-10-CM

## 2022-02-13 MED ORDER — ROSUVASTATIN CALCIUM 20 MG PO TABS
20.0000 mg | ORAL_TABLET | Freq: Every day | ORAL | 2 refills | Status: DC
  Filled 2022-02-13: qty 30, 30d supply, fill #0
  Filled 2022-03-26: qty 30, 30d supply, fill #1
  Filled 2022-04-24: qty 30, 30d supply, fill #2

## 2022-02-13 MED ORDER — LISINOPRIL 40 MG PO TABS
40.0000 mg | ORAL_TABLET | Freq: Every day | ORAL | 2 refills | Status: DC
  Filled 2022-02-13: qty 30, 30d supply, fill #0
  Filled 2022-03-26: qty 30, 30d supply, fill #1
  Filled 2022-04-24: qty 27, 27d supply, fill #2
  Filled 2022-04-24: qty 3, 3d supply, fill #2
  Filled 2022-04-24: qty 30, 30d supply, fill #2

## 2022-02-13 NOTE — Patient Instructions (Addendum)
[name], it was a pleasure seeing you today! You endorsed feeling well today. Below are some of the things we talked about this visit. We look forward to seeing you in the follow up appointment!  Today we discussed: For your abdominal pain: Your symptoms have resolved.  Avoid fatty foods and maintain good diet.  I am attaching a handout with for your reference. For your diabetes you do not bring a log of your sugars and you said you do not check them.  We will continue the same regimen and change once you have the continuous glucose monitor placed.  I encourage you to check your sugars to prevent further problems with her sugars. For your elevated blood pressure, you stated you do not have high blood pressure, it was elevated multiple times in the clinic.  I advised you to get a blood pressure monitor and check your blood pressures at home and bring a log to your next appointment in 2 weeks.  I have ordered the following labs today:  Lab Orders         Hemoglobin A1c         CMP14 + Anion Gap         TSH         POC Hbg A1C       Referrals ordered today:   Referral Orders  No referral(s) requested today     I have ordered the following medication/changed the following medications:    Start the following medications: Meds ordered this encounter  Medications   rosuvastatin (CRESTOR) 20 MG tablet    Sig: Take 1 tablet (20 mg total) by mouth daily.    Dispense:  30 tablet    Refill:  2    IM program   lisinopril (ZESTRIL) 40 MG tablet    Sig: Take 1 tablet (40 mg total) by mouth daily.    Dispense:  30 tablet    Refill:  2    IM program     Follow-up: Make an appointment to follow up in two weeks with PCP and Lupita Leash.  Please make sure to arrive 15 minutes prior to your next appointment. If you arrive late, you may be asked to reschedule.   We look forward to seeing you next time. Please call our clinic at [phone] if you have any questions or concerns. The best  time to call is Monday-Friday from 9am-4pm, but there is someone available 24/7. If after hours or the weekend, call the main hospital number and ask for the Internal Medicine Resident On-Call. If you need medication refills, please notify your pharmacy one week in advance and they will send [place] a request.  Thank you for letting us take part in your care. Wishing you the best!  Thank you, Gwenevere Abbot, MD

## 2022-02-13 NOTE — Progress Notes (Unsigned)
CC: Hospital Follow up  HPI:  Kristen Garrett is a 61 y.o. 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   Please see problem-based list for further details, assessments, and plans.  Past Medical History:  Diagnosis Date   Diabetes mellitus    DVT of lower limb, acute (HCC) Age 24   Went on blood thinner for a few months   Hyperlipidemia    Pancreatitis    Vertigo     Current Outpatient Medications (Endocrine & Metabolic):    empagliflozin (JARDIANCE) 10 MG TABS tablet, Take 1 tablet (10 mg total) by mouth daily.   insulin degludec (TRESIBA FLEXTOUCH) 100 UNIT/ML FlexTouch Pen, Inject 15 Units into the skin daily.  Current Outpatient Medications (Cardiovascular):    rosuvastatin (CRESTOR) 20 MG tablet, Take 1 tablet (20 mg total) by mouth daily.   lisinopril (ZESTRIL) 40 MG tablet, Take 1 tablet (40 mg total) by mouth daily.   Current Outpatient Medications (Analgesics):    acetaminophen (TYLENOL) 500 MG tablet, Take 2 tablets (1,000 mg total) by mouth every 6 (six) hours as needed for moderate pain.   aspirin 81 MG chewable tablet, Chew 1 tablet (81 mg total) by mouth daily.   Current Outpatient Medications (Other):    Continuous Blood Gluc Receiver (FREESTYLE LIBRE 2 READER) DEVI, 1 Device by Does not apply route once for 1 dose. Use to monitor blood glucose.   Continuous Blood Gluc Sensor (FREESTYLE LIBRE 2 SENSOR) MISC, 1 Application by Does not apply route every 14 (fourteen) days.   Blood Glucose Monitoring Suppl ([place]) w/Device KIT, 1 Device by Does not apply route daily.   gabapentin (NEURONTIN) 300 MG capsule, Take 2 capsules (600 mg total) by mouth 3 (three) times daily.   glucose blood (RELION GLUCOSE TEST STRIPS) test strip, Use as instructed   Insulin Pen Needle 32G X 4 MM MISC, Use to inject insulin one time daily. Dx  E11.65 (Prefers BD Pen Needles)   Lancets Misc. (RELION LANCING DEVICE) KIT, 1 Device by Does not apply route daily.   Multiple Vitamin (MULTIVITAMIN WITH MINERALS) TABS tablet, Take 1 tablet by mouth daily.  Review of Systems:  Review of system negative unless stated in the problem list or HPI.    Physical Exam:  Vitals:   02/13/22 0935 02/13/22 1018  BP: (!) 149/69 (!) 144/69  Pulse: 81 77  Temp: 98.1 F (36.7 C)   TempSrc: Oral   SpO2: 100%   Weight: 198 lb 14.4 oz (90.2 kg)   Height: _0  (1.676 m)     Physical Exam General: NAD HENT: NCAT Lungs: CTAB, no wheeze, rhonchi or rales.  Cardiovascular: Normal heart sounds, no r/m/g, 2+ pulses in all extremities. No LE edema Abdomen: No TTP, normal bowel sounds MSK: No asymmetry or muscle atrophy.  Skin: no lesions noted on exposed skin Neuro: Alert and oriented x4. CN grossly intact Psych: Normal mood and normal affect   Assessment & Plan:   Acute pancreatitis Patient presented to clinic after being hospitalized for acute pancreatitis. Her abdominal pain has resolved and she is able to tolerate PO without any difficulty. She is not reporting any nausea, or vomiting. This is her second episode of acute pancreatitis. It appears her pancreatitis could be secondary to hypertriglyceridemia. She does not drink and ultrasound did not show any gallstones and no hx of gallstones. Her lipid  panel does show triglycerides being elevated previously >800. We will continue to avoid GLP 1 agonist as it can cause acute pancreatitis but less likely in this case as pt was off of it for 1.5 months and was not on it during her previous episodes. She may benefit from further work up once she has insurance including ruling out autoimmune causes.  -Avoid GLP 1 agonist for now -Advised pt to eat a healthy diet and avoid fatty foods.   Type II diabetes mellitus ([place]) Patient has diabetes and is on Treshiba 20 units and Jardiance 10 mg qd. She has not  been checking her glucose at home and is resistant to any changes to her regimen. Her previous was 9.7 and she was hesitant to get any blood work including A1c this visit. After counseling, pt agreed to get blood work and her A1c was 12.5. She was on Ozempic briefly but we will avoid this due to concern for acute pancreatitis.  -Will discuss with pt to increase regimen while discussing result. She has appointment on 09/05 for a follow up and will discuss this further.   Addendum: Attempted to reach pt multiple times but unable to reach and discuss results and make adjustment to her diabetes regimen. Will try again but if able then will defer to visit on 09/05 for up-titration of her regimen which may be preferred as she stated her insurance will start 02/28/22.  Hyperlipidemia Patient has HLD, and her lipid panel done on 02/04/22 showed Chol 223, LDL 123 and triglycerides 373. Her ASCVD risk calculated at 15.6. She states only lipitor works for her but not the generic version. I counseled pt on trying Crestor and pt in agreement. She was hesistent at first but stated she will try it. Discussed potential need for additional medications as her triglycerides are elevated but pt currently hesistant to any new medications. Her triglycerides have been elevated >800 previously but I believe they are lower during her acute pancreatitis when checked in the hospital as patient was not able to tolerate PO intake days prior to coming to ED.  -Start Crestor 20 mg qd and ensure adherence.   Essential hypertension Patient states she does not have hypertension but was placed on lisinopril for nephro-protective reasons. I counseled the patient that might be initially the case but currently her numbers suggests she has hypertension. She was resistant to this diagnosis as well and I ask the patient to bring a log of blood pressures checked at home to the next appointment before we adjust medications.  -Follow up during next  visit and add HCTZ if bp elevated   Patient discussed with Dr. Dollene Cleveland, MD

## 2022-02-14 LAB — CMP14 + ANION GAP
ALT: 21 IU/L (ref 0–32)
AST: 15 IU/L (ref 0–40)
Albumin/Globulin Ratio: 1.4 (ref 1.2–2.2)
Albumin: 3.8 g/dL (ref 3.8–4.9)
Alkaline Phosphatase: 152 IU/L — ABNORMAL HIGH (ref 44–121)
Anion Gap: 15 mmol/L (ref 10.0–18.0)
BUN/Creatinine Ratio: 22 (ref 9–23)
BUN: 23 mg/dL (ref 6–24)
Bilirubin Total: <0.2 mg/dL (ref 0.0–1.2)
CO2: 20 mmol/L (ref 20–29)
Calcium: 9 mg/dL (ref 8.7–10.2)
Chloride: 104 mmol/L (ref 96–106)
Creatinine, Ser: 1.04 mg/dL — ABNORMAL HIGH (ref 0.57–1.00)
Globulin, Total: 2.8 g/dL (ref 1.5–4.5)
Glucose: 374 mg/dL — ABNORMAL HIGH (ref 70–99)
Potassium: 5.1 mmol/L (ref 3.5–5.2)
Sodium: 139 mmol/L (ref 134–144)
Total Protein: 6.6 g/dL (ref 6.0–8.5)
eGFR: 62 mL/min/{1.73_m2} (ref >59)

## 2022-02-14 LAB — HEMOGLOBIN A1C
Est. average glucose Bld gHb Est-mCnc: 312 mg/dL
Hgb A1c MFr Bld: 12.5 % — ABNORMAL HIGH (ref 4.8–5.6)

## 2022-02-14 LAB — TSH: TSH: 0.811 u[IU]/mL (ref 0.450–4.500)

## 2022-02-14 NOTE — Progress Notes (Incomplete)
CC: Hospital Follow up  HPI:  [name] is a 61 y.o. 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   Please see problem-based list for further details, assessments, and plans.  Past Medical History:  Diagnosis Date  . Diabetes mellitus   . DVT of lower limb, acute ([place]) Age 67   Went on blood thinner for a few months  . Hyperlipidemia   . Pancreatitis   . Vertigo     Current Outpatient Medications (Endocrine & Metabolic):  .  empagliflozin (JARDIANCE) 10 MG TABS tablet, Take 1 tablet (10 mg total) by mouth daily. .  insulin degludec (TRESIBA FLEXTOUCH) 100 UNIT/ML FlexTouch Pen, Inject 15 Units into the skin daily.  Current Outpatient Medications (Cardiovascular):  .  atorvastatin (LIPITOR) 40 MG tablet, Take 1 tablet (40 mg total) by mouth daily. (Patient not taking: Reported on 02/04/2022) .  lisinopril (ZESTRIL) 40 MG tablet, Take 1 tablet by mouth once daily   Current Outpatient Medications (Analgesics):  .  acetaminophen (TYLENOL) 500 MG tablet, Take 2 tablets (1,000 mg total) by mouth every 6 (six) hours as needed for moderate pain. Marland Kitchen  aspirin 81 MG chewable tablet, Chew 1 tablet (81 mg total) by mouth daily.   Current Outpatient Medications (Other):  .  Blood Glucose Monitoring Suppl ([place]) w/Device KIT, 1 Device by Does not apply route daily. .  Continuous Blood Gluc Receiver ([place]) [place], 1 application. by Does not apply route continuous. .  Continuous Blood Gluc Sensor (DEXCOM G6 SENSOR) MISC, 1 application. by Does not apply route every 14 (fourteen) days. .  Continuous Blood Gluc Transmit (DEXCOM G6 TRANSMITTER) MISC, 1 application. by Does not apply route continuous. Marland Kitchen  dicyclomine (BENTYL) 20 MG tablet, Take 1 tablet (20 mg total) by mouth 4 (four) times daily as needed (Abdominal cramping). (Patient not  taking: Reported on 02/04/2022) .  gabapentin (NEURONTIN) 300 MG capsule, Take 2 capsules (600 mg total) by mouth 3 (three) times daily. Marland Kitchen  glucose blood (RELION GLUCOSE TEST STRIPS) test strip, Use as instructed .  Insulin Pen Needle 32G X 4 MM MISC, Use to inject insulin one time daily. Dx E11.65 (Prefers BD Pen Needles) .  Lancets Misc. (RELION LANCING DEVICE) KIT, 1 Device by Does not apply route daily. .  Multiple Vitamin (MULTIVITAMIN WITH MINERALS) TABS tablet, Take 1 tablet by mouth daily. .  ondansetron (ZOFRAN) 4 MG tablet, Take 1 tablet (4 mg total) by mouth every 6 (six) hours as needed for nausea.  Review of Systems:  Review of system negative unless stated in the problem list or HPI.    Physical Exam:  Vitals:   02/13/22 0935  BP: (!) 149/69  Pulse: 81  Temp: 98.1 F (36.7 C)  TempSrc: Oral  SpO2: 100%  Weight: 198 lb 14.4 oz (90.2 kg)  Height: $Remove'5\' 6"'[id number]$  (1.676 m)    Physical Exam General: NAD HENT: NCAT Lungs: CTAB, no wheeze, rhonchi or rales.  Cardiovascular: Normal heart sounds, no r/m/g, 2+ pulses in all extremities. No LE edema Abdomen: No TTP, normal bowel sounds MSK: No asymmetry or muscle atrophy.  Skin: no lesions noted on exposed skin Neuro: Alert and oriented x4. CN grossly intact Psych: Normal mood and normal affect   Assessment & Plan:   See Encounters Tab for problem based charting.  Patient discussed with Dr. Dollene Cleveland, MD  Medication Changes Tresiba 20 units daily ASCVD risk 15.6

## 2022-02-14 NOTE — Assessment & Plan Note (Signed)
Patient presented to clinic after being hospitalized for acute pancreatitis. This is her second episode of acute pancreatitis. It appears her pancreatitis could be secondary to hypertriglyceridemia. She does not drink and ultrasound did not show any gallstones and no hx of gallstones. Her lipid panel does show triglycerides being elevated previously >800. We will continue to avoid GLP 1 agonist as it can cause acute pancreatitis but less likely in this case as pt was off of it for 1.5 months and was not on it during her previous episodes. She may benefit from further work up once she has insurance including ruling out autoimmune causes.  -Avoid GLP 1 agonist for now -Advised pt to eat a healthy diet and avoid fatty foods.

## 2022-02-15 NOTE — Assessment & Plan Note (Signed)
Patient states she does not have hypertension but was placed on lisinopril for nephro-protective reasons. I counseled the patient that might be initially the case but currently her numbers suggests she has hypertension. She was resistant to this diagnosis as well and I ask the patient to bring a log of blood pressures checked at home to the next appointment before we adjust medications.  -Follow up during next visit and add HCTZ if bp elevated

## 2022-02-15 NOTE — Assessment & Plan Note (Signed)
Patient has HLD, and her lipid panel done on 02/04/22 showed Chol 223, LDL 123 and triglycerides 563. Her ASCVD risk calculated at 15.6. She states only lipitor works for her but not the generic version. I counseled pt on trying Crestor and pt in agreement. She was hesistent at first but stated she will try it. Discussed potential need for additional medications as her triglycerides are elevated but pt currently hesistant to any new medications. Her triglycerides have been elevated >800 previously but I believe they are lower during her when check in the hospital as patient was not able to tolerate PO intake days prior to coming to ED.  -Start Crestor 20 mg qd and ensure adherence.

## 2022-02-15 NOTE — Assessment & Plan Note (Signed)
Patient has diabetes and is on Treshiba 20 units and Jardiance 10 mg qd. She has not been checking her glucose at home and is resistant to any changes to her regimen. Her previous was 9.7 and she was hesitant to get any blood work including A1c this visit. After counseling, pt agreed to get blood work and her A1c was 12.5. She was on Ozempic briefly but we will avoid this due to concern for acute pancreatitis.  -Will discuss with pt to increase regimen while discussing result. She has appointment on 09/05 for a follow up and will discuss this further.   Addendum: Attempted to reach pt multiple times but unable to reach and discuss results and make adjustment to her diabetes regimen. Will try again but if able then will defer to visit on 09/05 for up-titration of her regimen which may be preferred as she stated her insurance will start 02/28/22.

## 2022-02-16 MED ORDER — FREESTYLE LIBRE 2 READER DEVI
1.0000 | Freq: Once | 0 refills | Status: AC
  Filled 2022-02-16 – 2022-05-30 (×2): qty 1, 30d supply, fill #0

## 2022-02-16 MED ORDER — FREESTYLE LIBRE 2 SENSOR MISC
1.0000 | 2 refills | Status: DC
  Filled 2022-02-16: qty 2, 30d supply, fill #0
  Filled 2022-03-12: qty 2, 28d supply, fill #0

## 2022-02-17 ENCOUNTER — Other Ambulatory Visit (HOSPITAL_COMMUNITY): Payer: Self-pay

## 2022-02-19 ENCOUNTER — Other Ambulatory Visit (HOSPITAL_COMMUNITY): Payer: Self-pay

## 2022-02-20 ENCOUNTER — Other Ambulatory Visit (HOSPITAL_COMMUNITY): Payer: Self-pay

## 2022-02-20 NOTE — Progress Notes (Signed)
Internal Medicine Clinic Attending  Case discussed with Dr. Khan  At the time of the visit.  We reviewed the resident's history and exam and pertinent patient test results.  I agree with the assessment, diagnosis, and plan of care documented in the resident's note.  

## 2022-02-28 ENCOUNTER — Other Ambulatory Visit (HOSPITAL_COMMUNITY): Payer: Self-pay

## 2022-03-12 ENCOUNTER — Other Ambulatory Visit (HOSPITAL_COMMUNITY): Payer: Self-pay

## 2022-03-12 ENCOUNTER — Other Ambulatory Visit: Payer: Self-pay | Admitting: Internal Medicine

## 2022-03-12 DIAGNOSIS — E1165 Type 2 diabetes mellitus with hyperglycemia: Secondary | ICD-10-CM

## 2022-03-12 NOTE — Telephone Encounter (Signed)
Next appt scheduled 9/14 with Dr Cliffton Asters.

## 2022-03-13 ENCOUNTER — Other Ambulatory Visit: Payer: Self-pay | Admitting: Internal Medicine

## 2022-03-13 ENCOUNTER — Encounter: Payer: Self-pay | Admitting: Student

## 2022-03-13 ENCOUNTER — Other Ambulatory Visit (HOSPITAL_COMMUNITY): Payer: Self-pay

## 2022-03-13 DIAGNOSIS — E1165 Type 2 diabetes mellitus with hyperglycemia: Secondary | ICD-10-CM

## 2022-03-13 NOTE — Telephone Encounter (Signed)
insulin degludec (TRESIBA FLEXTOUCH) 100 UNIT/ML FlexTouch Pen,  Continuous Blood Gluc Sensor (FREESTYLE LIBRE 2 SENSOR) MISC, REFILL REQUEST @ [address] - [place], [place] - [address]

## 2022-03-13 NOTE — Telephone Encounter (Signed)
Will refill, she needs a follow up appointment to discuss her insulin regimen.

## 2022-03-14 ENCOUNTER — Other Ambulatory Visit (HOSPITAL_COMMUNITY): Payer: Self-pay

## 2022-03-26 ENCOUNTER — Other Ambulatory Visit: Payer: Self-pay | Admitting: Student

## 2022-03-26 ENCOUNTER — Other Ambulatory Visit (HOSPITAL_COMMUNITY): Payer: Self-pay

## 2022-03-26 DIAGNOSIS — E1165 Type 2 diabetes mellitus with hyperglycemia: Secondary | ICD-10-CM

## 2022-03-31 ENCOUNTER — Other Ambulatory Visit (HOSPITAL_COMMUNITY): Payer: Self-pay

## 2022-04-18 ENCOUNTER — Other Ambulatory Visit: Payer: Self-pay | Admitting: Student

## 2022-04-18 DIAGNOSIS — E1165 Type 2 diabetes mellitus with hyperglycemia: Secondary | ICD-10-CM

## 2022-04-21 ENCOUNTER — Other Ambulatory Visit: Payer: Self-pay

## 2022-04-21 DIAGNOSIS — E1165 Type 2 diabetes mellitus with hyperglycemia: Secondary | ICD-10-CM

## 2022-04-22 NOTE — Telephone Encounter (Signed)
Continuous Blood Gluc Sensor (FREESTYLE LIBRE 14 DAY SENSOR)

## 2022-04-23 MED ORDER — FREESTYLE LIBRE 14 DAY SENSOR MISC: 0 refills | Status: DC

## 2022-04-24 ENCOUNTER — Other Ambulatory Visit (HOSPITAL_COMMUNITY): Payer: Self-pay

## 2022-04-24 ENCOUNTER — Other Ambulatory Visit: Payer: Self-pay | Admitting: Student

## 2022-04-24 DIAGNOSIS — E1165 Type 2 diabetes mellitus with hyperglycemia: Secondary | ICD-10-CM

## 2022-04-24 NOTE — Telephone Encounter (Signed)
Call to patient to ask about insulin dosing is now taking 40 units daily.  Says she went up on her own about a month ago.  Sugars are running 110. 102. 130 ranges usually.  States she feels great with the increase   Had 1 low early this am of 68.  Did not eat dinner last night.  Has after this gone to 130.  150 today.  Takes her blood sugars about every 4 hours.  Today due to the low is doing every 2 hours.

## 2022-04-24 NOTE — Telephone Encounter (Signed)
Can we please clarify with patient what does of insulin she is taking? According to the last note, she was prescribed 20 units daily. However the Rx from her med list that visit said 15. Then her dose was changed on her last refill to 30 units daily.

## 2022-04-25 MED ORDER — TRESIBA FLEXTOUCH 100 UNIT/ML ~~LOC~~ SOPN: 40.0000 [IU] | PEN_INJECTOR | Freq: Every day | SUBCUTANEOUS | 0 refills | Status: DC

## 2022-04-25 NOTE — Telephone Encounter (Signed)
Ok sent short term rx with new instructions for 40 units daily. Please ask patient to keep her scheduled follow up appointment. Thanks.

## 2022-04-25 NOTE — Addendum Note (Signed)
Addended by: Jodean Lima on: 04/25/2022 08:07 AM   Modules accepted: Orders

## 2022-05-01 ENCOUNTER — Encounter: Payer: Self-pay | Admitting: Student

## 2022-05-01 ENCOUNTER — Other Ambulatory Visit: Payer: Self-pay

## 2022-05-01 ENCOUNTER — Ambulatory Visit (INDEPENDENT_AMBULATORY_CARE_PROVIDER_SITE_OTHER): Payer: BLUE CROSS/BLUE SHIELD | Admitting: Student

## 2022-05-01 VITALS — BP 144/68 | HR 79 | Temp 98.4°F | Ht 66.0 in | Wt 205.4 lb

## 2022-05-01 DIAGNOSIS — E1142 Type 2 diabetes mellitus with diabetic polyneuropathy: Secondary | ICD-10-CM | POA: Diagnosis not present

## 2022-05-01 DIAGNOSIS — I10 Essential (primary) hypertension: Secondary | ICD-10-CM

## 2022-05-01 DIAGNOSIS — Z794 Long term (current) use of insulin: Secondary | ICD-10-CM

## 2022-05-01 DIAGNOSIS — E1165 Type 2 diabetes mellitus with hyperglycemia: Secondary | ICD-10-CM | POA: Diagnosis not present

## 2022-05-01 DIAGNOSIS — E1121 Type 2 diabetes mellitus with diabetic nephropathy: Secondary | ICD-10-CM

## 2022-05-01 DIAGNOSIS — F17210 Nicotine dependence, cigarettes, uncomplicated: Secondary | ICD-10-CM

## 2022-05-01 LAB — POCT GLYCOSYLATED HEMOGLOBIN (HGB A1C): Hemoglobin A1C: 10.7 % — AB (ref 4.0–5.6)

## 2022-05-01 LAB — GLUCOSE, CAPILLARY: Glucose-Capillary: 216 mg/dL — ABNORMAL HIGH (ref 70–99)

## 2022-05-01 MED ORDER — FREESTYLE LIBRE 14 DAY SENSOR MISC: 0 refills | Status: DC

## 2022-05-01 MED ORDER — TRESIBA FLEXTOUCH 100 UNIT/ML ~~LOC~~ SOPN: 40.0000 [IU] | PEN_INJECTOR | Freq: Every day | SUBCUTANEOUS | 0 refills | Status: DC

## 2022-05-01 MED ORDER — EMPAGLIFLOZIN 10 MG PO TABS: 10.0000 mg | ORAL_TABLET | Freq: Every day | ORAL | 2 refills | Status: DC

## 2022-05-01 MED ORDER — GABAPENTIN 300 MG PO CAPS: 600.0000 mg | ORAL_CAPSULE | Freq: Three times a day (TID) | ORAL | 3 refills | Status: DC

## 2022-05-01 MED ORDER — HYDROCHLOROTHIAZIDE 12.5 MG PO TABS: 12.5000 mg | ORAL_TABLET | Freq: Every day | ORAL | 0 refills | Status: DC

## 2022-05-01 NOTE — Patient Instructions (Signed)
Ms.Deklynn Tarnow, it was a pleasure seeing you today!  Today we discussed: - Diabetes: I would like for you to continue with your current medications of Tresiba and Jardiance.  - For your blood pressure, please start taking lisinopril and start taking hydrochlorothiazide as well. I would like for you to retun to clinic in one month.   I have ordered the following labs today:   Lab Orders         Microalbumin / Creatinine Urine Ratio         POC Hbg A1C       I have ordered the following medication/changed the following medications:   Start the following medications: Meds ordered this encounter  Medications   insulin degludec (TRESIBA FLEXTOUCH) 100 UNIT/ML FlexTouch Pen    Sig: Inject 40 Units into the skin daily.    Dispense:  15 mL    Refill:  0   empagliflozin (JARDIANCE) 10 MG TABS tablet    Sig: Take 1 tablet (10 mg total) by mouth daily.    Dispense:  90 tablet    Refill:  2   gabapentin (NEURONTIN) 300 MG capsule    Sig: Take 2 capsules (600 mg total) by mouth 3 (three) times daily.    Dispense:  180 capsule    Refill:  3   Continuous Blood Gluc Sensor (FREESTYLE LIBRE 14 DAY SENSOR) MISC    Sig: USE AS DIRECTED 4 TIMES DAILY FOR BLOOD SUGAR    Dispense:  2 each    Refill:  0   hydrochlorothiazide (HYDRODIURIL) 12.5 MG tablet    Sig: Take 1 tablet (12.5 mg total) by mouth daily.    Dispense:  30 tablet    Refill:  0     Follow-up:  1 month    Please make sure to arrive 15 minutes prior to your next appointment. If you arrive late, you may be asked to reschedule.   We look forward to seeing you next time. Please call our clinic at [phone] if you have any questions or concerns. The best time to call is Monday-Friday from 9am-4pm, but there is someone available 24/7. If after hours or the weekend, call the main hospital number and ask for the Internal Medicine Resident On-Call. If you need medication refills, please notify your pharmacy one week in advance and  they will send [place] a request.  Thank you for letting us take part in your care. Wishing you the best!  Thank you, Sanjuan Dame, MD

## 2022-05-02 LAB — MICROALBUMIN / CREATININE URINE RATIO
Creatinine, Urine: 59.4 mg/dL
Microalb/Creat Ratio: 1178 mg/g creat — ABNORMAL HIGH (ref 0–29)
Microalbumin, Urine: 699.5 ug/mL

## 2022-05-05 NOTE — Assessment & Plan Note (Addendum)
Ms. Wisham is presenting to clinic today to discuss diabetes. She reports she was taking [place] 30 units daily but still had very uncontrolled sugars. She self up-titrated to 40 units daily with a vast improvement. Reports glucose typically ~150. Discussed importance of keeping up with medications and appointments in order to adequately get her diabetes under control.   Today we will plan for A1c, microalbumin/creatinine. At this juncture we are avoiding GLP-1 due to concern for pancreatitis. Will plan to continue current regimen with her increase in [place].  - Continue Tresiba 40 units daily - Empagliflozin 10mg  daily - Urine microalbumin/creatinine ratio today - Re-order new Dexcom - Continue statin - Repeat A1c in three months  ADDENDUM: UMicroalb/Cr significantly elevated at >1000. She is already on ACE and Jardiance. We will plan to increase Jardiance to 25mg  now. At next appointment in February can consider addition of finerenone and obtain BMP.  - Increase Jardiance 25mg  daily - BMP at next appointment

## 2022-05-05 NOTE — Assessment & Plan Note (Signed)
BP Readings from Last 3 Encounters:  05/01/22 (!) 144/68  02/13/22 (!) 144/69  02/06/22 (!) 162/89   Ms. Cuny's pressures continue to be significantly elevated. At home she does report some blood pressure readings in 130's. I do think regardless she would benefit from further antihypertensive effect. We will initiate low-dose hydrochlorothiazide today. Per patient request will hold off BMP until next visit  - Start hydrochlorothiazide 12.5mg  daily - Continue lisinopril 40mg  daily - BMP at next visit

## 2022-05-05 NOTE — Progress Notes (Signed)
   CC: follow-up  HPI:  KristenKristen Garrett is a 61 y.o. person with medical history as below presenting to [facility] for diabetes follow-up.   Please see problem-based list for further details, assessments, and plans.  Past Medical History:  Diagnosis Date   Diabetes mellitus    DVT of lower limb, acute ([place]) Age 71   Went on blood thinner for a few months   Hyperlipidemia    Pancreatitis    Vertigo    Review of Systems:  As per HPI  Physical Exam:  Vitals:   05/01/22 1502 05/01/22 1510  BP: (!) 147/73 (!) 144/68  Pulse: 82 79  Temp: 98.4 F (36.9 C)   TempSrc: Oral   SpO2: 100%   Weight: 205 lb 6.4 oz (93.2 kg)   Height: 5\' 6"  (1.676 m)    General: Resting comfortably in no acute distress CV: Regular rate, rhythm. No murmurs appreciated. Warm extremities.  Pulm: Normal work of breathing on room air. Clear to auscultation bilaterally Neuro: Awake, alert, conversing appropriately. Psych: Normal mood, affect, speech.  Assessment & Plan:   Type II diabetes mellitus ([place]) Kristen Garrett is presenting to clinic today to discuss diabetes. She reports she was taking [place] 30 units daily but still had very uncontrolled sugars. She self up-titrated to 40 units daily with a vast improvement. Reports glucose typically ~150. Discussed importance of keeping up with medications and appointments in order to adequately get her diabetes under control.   Today we will plan for A1c, microalbumin/creatinine. At this juncture we are avoiding GLP-1 due to concern for pancreatitis. Will plan to continue current regimen with her increase in [place].  - Continue Tresiba 40 units daily - Empagliflozin 10mg  daily - Urine microalbumin/creatinine ratio today - Re-order new Dexcom - Continue statin - Repeat A1c in three months  Essential hypertension BP Readings from Last 3 Encounters:  05/01/22 (!) 144/68  02/13/22 (!) 144/69  02/06/22 (!) 162/89   Kristen Garrett's pressures continue to be  significantly elevated. At home she does report some blood pressure readings in 130's. I do think regardless she would benefit from further antihypertensive effect. We will initiate low-dose hydrochlorothiazide today. Per patient request will hold off BMP until next visit  - Start hydrochlorothiazide 12.5mg  daily - Continue lisinopril 40mg  daily - BMP at next visit  Patient discussed with Dr.  Garnet Koyanagi, MD Internal Medicine PGY-3 Pager: [phone]

## 2022-05-08 NOTE — Progress Notes (Signed)
Internal Medicine Clinic Attending  Case discussed with Dr. Marijo Conception  At the time of the visit.  We reviewed the resident's history and exam and pertinent patient test results.  I agree with the assessment, diagnosis, and plan of care documented in the resident's note. Will need to discuss increased SGLT2i and likely finerenone, pending updated potassium level, given severe albuminuria. Message sent to Dr. Marijo Conception for f/u.

## 2022-05-08 NOTE — Addendum Note (Signed)
Addended by: Dickie La on: 05/08/2022 04:22 PM   Modules accepted: Level of Service

## 2022-05-15 MED ORDER — EMPAGLIFLOZIN 25 MG PO TABS: 25.0000 mg | ORAL_TABLET | Freq: Every day | ORAL | 2 refills | Status: DC

## 2022-05-15 NOTE — Addendum Note (Signed)
Addended byEvlyn Kanner on: 05/15/2022 10:37 AM   Modules accepted: Orders

## 2022-05-30 ENCOUNTER — Other Ambulatory Visit: Payer: Self-pay | Admitting: Internal Medicine

## 2022-05-30 ENCOUNTER — Other Ambulatory Visit (HOSPITAL_COMMUNITY): Payer: Self-pay

## 2022-05-30 DIAGNOSIS — I10 Essential (primary) hypertension: Secondary | ICD-10-CM

## 2022-05-30 DIAGNOSIS — E785 Hyperlipidemia, unspecified: Secondary | ICD-10-CM

## 2022-05-30 DIAGNOSIS — E1165 Type 2 diabetes mellitus with hyperglycemia: Secondary | ICD-10-CM

## 2022-06-02 ENCOUNTER — Other Ambulatory Visit (HOSPITAL_COMMUNITY): Payer: Self-pay

## 2022-06-02 MED ORDER — FREESTYLE LIBRE 2 SENSOR MISC: 1.0000 | 2 refills | Status: DC

## 2022-06-02 MED ORDER — ROSUVASTATIN CALCIUM 20 MG PO TABS
20.0000 mg | ORAL_TABLET | Freq: Every day | ORAL | 2 refills | Status: DC
  Filled 2022-06-02: qty 30, 30d supply, fill #0
  Filled 2022-07-04: qty 30, 30d supply, fill #1
  Filled 2022-08-08: qty 30, 30d supply, fill #2

## 2022-06-02 MED ORDER — LISINOPRIL 40 MG PO TABS
40.0000 mg | ORAL_TABLET | Freq: Every day | ORAL | 2 refills | Status: DC
  Filled 2022-06-02: qty 30, 30d supply, fill #0
  Filled 2022-07-04: qty 30, 30d supply, fill #1
  Filled 2022-08-08: qty 30, 30d supply, fill #2

## 2022-06-03 ENCOUNTER — Other Ambulatory Visit (HOSPITAL_COMMUNITY): Payer: Self-pay

## 2022-07-04 ENCOUNTER — Other Ambulatory Visit (HOSPITAL_COMMUNITY): Payer: Self-pay

## 2022-08-05 ENCOUNTER — Other Ambulatory Visit: Payer: Self-pay | Admitting: Internal Medicine

## 2022-08-05 DIAGNOSIS — E1165 Type 2 diabetes mellitus with hyperglycemia: Secondary | ICD-10-CM

## 2022-08-05 NOTE — Telephone Encounter (Signed)
Pt states she need insulin degludec (TRESIBA FLEXTOUCH) 100 UNIT/ML FlexTouch Pen to be filled by today. Pt is completely out of insulin. Please call pt back.

## 2022-08-05 NOTE — Telephone Encounter (Signed)
Next appt scheduled 08/11/22 with PCP. 

## 2022-08-08 ENCOUNTER — Other Ambulatory Visit (HOSPITAL_COMMUNITY): Payer: Self-pay

## 2022-08-11 ENCOUNTER — Encounter: Payer: BLUE CROSS/BLUE SHIELD | Admitting: Student

## 2022-08-19 ENCOUNTER — Ambulatory Visit (INDEPENDENT_AMBULATORY_CARE_PROVIDER_SITE_OTHER): Payer: Medicaid Other | Admitting: Student

## 2022-08-19 ENCOUNTER — Other Ambulatory Visit: Payer: Self-pay

## 2022-08-19 ENCOUNTER — Encounter: Payer: Self-pay | Admitting: Student

## 2022-08-19 VITALS — BP 133/76 | HR 79 | Temp 98.2°F | Ht 66.5 in | Wt 202.9 lb

## 2022-08-19 DIAGNOSIS — E1121 Type 2 diabetes mellitus with diabetic nephropathy: Secondary | ICD-10-CM | POA: Diagnosis not present

## 2022-08-19 DIAGNOSIS — R5383 Other fatigue: Secondary | ICD-10-CM

## 2022-08-19 DIAGNOSIS — Z794 Long term (current) use of insulin: Secondary | ICD-10-CM | POA: Diagnosis not present

## 2022-08-19 DIAGNOSIS — E049 Nontoxic goiter, unspecified: Secondary | ICD-10-CM | POA: Diagnosis not present

## 2022-08-19 DIAGNOSIS — E1165 Type 2 diabetes mellitus with hyperglycemia: Secondary | ICD-10-CM | POA: Diagnosis not present

## 2022-08-19 DIAGNOSIS — I10 Essential (primary) hypertension: Secondary | ICD-10-CM | POA: Diagnosis not present

## 2022-08-19 DIAGNOSIS — F17210 Nicotine dependence, cigarettes, uncomplicated: Secondary | ICD-10-CM

## 2022-08-19 DIAGNOSIS — E785 Hyperlipidemia, unspecified: Secondary | ICD-10-CM

## 2022-08-19 MED ORDER — INSULIN DETEMIR 100 UNIT/ML ~~LOC~~ SOLN: 40.0000 [IU] | Freq: Every day | SUBCUTANEOUS | 0 refills | Status: DC

## 2022-08-19 NOTE — Assessment & Plan Note (Signed)
Patient's last lipid panel was done in August, which showed an LDL of 116 which is well above her goal of 70.  She was started on rosuvastatin 20 mg at that time and has not been checked since.  Will recheck lipid panel today.

## 2022-08-19 NOTE — Progress Notes (Signed)
CC: Diabetes follow-up  HPI:  Kristen Garrett is a 62 y.o. female living with a history stated below and presents today for diabetes follow-up. Please see problem based assessment and plan for additional details.  Past Medical History:  Diagnosis Date   Diabetes mellitus    DVT of lower limb, acute (HCC) Age 36   Went on blood thinner for a few months   Hyperlipidemia    Pancreatitis    Vertigo     Current Outpatient Medications on File Prior to Visit  Medication Sig Dispense Refill   acetaminophen (TYLENOL) 500 MG tablet Take 2 tablets (1,000 mg total) by mouth every 6 (six) hours as needed for moderate pain. 80 tablet 0   aspirin 81 MG chewable tablet Chew 1 tablet (81 mg total) by mouth daily. 90 tablet 2   Blood Glucose Monitoring Suppl (Omega) w/Device KIT 1 Device by Does not apply route daily. 1 kit 0   Continuous Blood Gluc Sensor (FREESTYLE LIBRE 14 DAY SENSOR) MISC USE AS DIRECTED 4 TIMES DAILY FOR BLOOD SUGAR 2 each 0   Continuous Blood Gluc Sensor (FREESTYLE LIBRE 2 SENSOR) MISC Use every 14 days as directed. 2 each 2   empagliflozin (JARDIANCE) 25 MG TABS tablet Take 1 tablet (25 mg total) by mouth daily before breakfast. 90 tablet 2   gabapentin (NEURONTIN) 300 MG capsule Take 2 capsules (600 mg total) by mouth 3 (three) times daily. 180 capsule 3   glucose blood (RELION GLUCOSE TEST STRIPS) test strip Use as instructed 100 each 12   Insulin Pen Needle 32G X 4 MM MISC Use to inject insulin one time daily. Dx E11.65 (Prefers BD Pen Needles) 100 each prn   Lancets Misc. (RELION LANCING DEVICE) KIT 1 Device by Does not apply route daily. 1 kit 0   lisinopril (ZESTRIL) 40 MG tablet Take 1 tablet (40 mg total) by mouth daily. 30 tablet 2   Multiple Vitamin (MULTIVITAMIN WITH MINERALS) TABS tablet Take 1 tablet by mouth daily.     rosuvastatin (CRESTOR) 20 MG tablet Take 1 tablet (20 mg total) by mouth daily. 30 tablet 2   TRESIBA FLEXTOUCH 100  UNIT/ML FlexTouch Pen INJECT 40 UNITS SUBCUTANEOUSLY ONCE DAILY 15 mL 0   No current facility-administered medications on file prior to visit.    Family History  Problem Relation Age of Onset   Diabetes Mother    Hypertension Mother    Hyperlipidemia Mother    Diabetes Father    Hyperlipidemia Father    Diabetes Brother     Social History   Socioeconomic History   Marital status: Married    Spouse name: Not on file   Number of children: Not on file   Years of education: Not on file   Highest education level: Not on file  Occupational History   Occupation: Unemployed   Tobacco Use   Smoking status: Every Day    Packs/day: 0.50    Years: 40.00    Total pack years: 20.00    Types: Cigarettes   Smokeless tobacco: Never   Tobacco comments:    10-15 cigs per day  Substance and Sexual Activity   Alcohol use: No    Alcohol/week: 0.0 standard drinks of alcohol   Drug use: No   Sexual activity: Not on file  Other Topics Concern   Not on file  Social History Narrative   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 Lives in [place] with her husband.  2 grown daughters.       Social Determinants of Health   Financial Resource Strain: Low Risk  (08/19/2022)   Overall Financial Resource Strain (CARDIA)    Difficulty of Paying Living Expenses: Not hard at all  Recent Concern: Financial Resource Strain - Medium Risk (08/19/2022)   Overall Financial Resource Strain (CARDIA)    Difficulty of Paying Living Expenses: Somewhat hard  Food Insecurity: No Food Insecurity (08/19/2022)   Hunger Vital Sign    Worried About Running Out of Food in the Last Year: Never true    Ran Out of Food in the Last Year: Never true  Transportation Needs: No Transportation Needs (08/19/2022)   PRAPARE - Hydrologist (Medical): No    Lack of Transportation (Non-Medical): No  Physical Activity: Not on file  Stress: Not on file  Social Connections: Socially  Integrated (08/19/2022)   Social Connection and Isolation Panel [NHANES]    Frequency of Communication with Friends and Family: More than three times a week    Frequency of Social Gatherings with Friends and Family: More than three times a week    Attends Religious Services: More than 4 times per year    Active Member of [employer] or Organizations: Yes    Attends Music therapist: More than 4 times per year    Marital Status: Married  Human resources officer Violence: Not At Risk (08/19/2022)   Humiliation, Afraid, Rape, and Kick questionnaire    Fear of Current or Ex-Partner: No    Emotionally Abused: No    Physically Abused: No    Sexually Abused: No    Review of Systems: ROS negative except for what is noted on the assessment and plan.  Vitals:   08/19/22 0851 08/19/22 0916  BP: (!) 144/69 133/76  Pulse: 81 79  Temp: 98.2 F (36.8 C)   TempSrc: Oral   SpO2: 100%   Weight: 202 lb 14.4 oz (92 kg)   Height: 5' 6.5" (1.689 m)     Physical Exam: Constitutional: well-appearing woman, in no acute distress HENT: normocephalic atraumatic, mucous membranes moist Eyes: conjunctiva non-erythematous Neck: supple Cardiovascular: regular rate and rhythm, no m/r/g Pulmonary/Chest: normal work of breathing on room air, lungs clear to auscultation bilaterally Abdominal: soft, non-tender, non-distended MSK: normal bulk and tone, foot exam shows no ulcerations or blisters Neurological: alert & oriented x 3, normal sensation in bilateral lower extremities  Skin: warm and dry   Assessment & Plan:   Diabetes mellitus with microalbuminuric diabetic nephropathy ([place]) Patient returns for a follow-up for her diabetes.  She is supposed to be taking [place] 40 units, as well as Jardiance 25 mg.  However she has run into financial restraints in order to obtain it.  She says the [place] cost her $25 with her current insurance, however she is unwilling to change at this point.  She states that her  Vania Rea has been causing her back pain, and I was able to inform the patient that this is not one of the side effects of Jardiance and she has not been taking it since the back pain started.  She denies any polyuria or polydipsia.  She is already on an ACE inhibitor with lisinopril, and I was able to provide her sample of Jardiance.  Patient does not have a glucose monitor at home, but she does have the prescription however it is too expensive for her at this time.  Recommended the patient go to [facility] which seems  to be the cheapest option for a glucometer and pick that up with strips.  Plan: - Will check A1c, and BMP to assess for kidney function - Provided samples for Jardiance 25 mg as well as Levemir 300 mL pen, which should last around 8 days - Can consider switching to Lantus if patient is amenable, as this may be cheaper for insurance.  She states she can afford around $20. -Will leave [place] on patient's current med list however she is not taking due to affordability issues.  In case she does eventually get the money to retrieve the medication, would like pharmacy to fill it.  Enlarged thyroid Patient endorses recent fatigue, and has a history of an enlarged thyroid.  Will recheck TSH today.  Hyperlipidemia Patient's last lipid panel was done in August, which showed an LDL of 116 which is well above her goal of 70.  She was started on rosuvastatin 20 mg at that time and has not been checked since.  Will recheck lipid panel today.  Essential hypertension Blood pressure is adequate today, will continue lisinopril 40 mg  Patient discussed with Dr. Tobi Bastos Mallorey Odonell, M.D. [facility], PGY-1 Phone: [phone] Date 08/19/2022 Time 3:57 PM

## 2022-08-19 NOTE — Assessment & Plan Note (Signed)
Blood pressure is adequate today, will continue lisinopril 40 mg

## 2022-08-19 NOTE — Assessment & Plan Note (Addendum)
Patient returns for a follow-up for her diabetes.  She is supposed to be taking [place] 40 units, as well as Jardiance 25 mg.  However she has run into financial restraints in order to obtain it.  She says the [place] cost her $76 with her current insurance, however she is unwilling to change at this point.  She states that her Vania Rea has been causing her back pain, and I was able to inform the patient that this is not one of the side effects of Jardiance and she has not been taking it since the back pain started.  She denies any polyuria or polydipsia.  She is already on an ACE inhibitor with lisinopril, and I was able to provide her sample of Jardiance.  Patient does not have a glucose monitor at home, but she does have the prescription however it is too expensive for her at this time.  Recommended the patient go to [facility] which seems to be the cheapest option for a glucometer and pick that up with strips.  Plan: - Will check A1c, and BMP to assess for kidney function - Provided samples for Jardiance 25 mg as well as Levemir 300 mL pen, which should last around 8 days - Can consider switching to Lantus if patient is amenable, as this may be cheaper for insurance.  She states she can afford around $20. -Will leave [place] on patient's current med list however she is not taking due to affordability issues.  In case she does eventually get the money to retrieve the medication, would like pharmacy to fill it.

## 2022-08-19 NOTE — Assessment & Plan Note (Signed)
Patient endorses recent fatigue, and has a history of an enlarged thyroid.  Will recheck TSH today.

## 2022-08-19 NOTE — Patient Instructions (Signed)
Thank you so much for coming to the clinic today!   We have given you the Levemir, it should last you about a month. We are also checking the labs, I will call you if there is anything abnormal. There are some cheap glucometers you can get at [facility], as well as the strips for them. I highly recommend to get those as well.   If you have any questions please feel free to the call the clinic at anytime at [phone]. It was a pleasure seeing you!  Best, Dr. Sanjuana Mae

## 2022-08-20 LAB — BMP8+ANION GAP
Anion Gap: 10 mmol/L (ref 10.0–18.0)
BUN/Creatinine Ratio: 21 (ref 9–23)
BUN: 20 mg/dL (ref 6–24)
CO2: 24 mmol/L (ref 20–29)
Calcium: 9.2 mg/dL (ref 8.7–10.2)
Chloride: 102 mmol/L (ref 96–106)
Creatinine, Ser: 0.94 mg/dL (ref 0.57–1.00)
Glucose: 318 mg/dL — ABNORMAL HIGH (ref 70–99)
Potassium: 4.8 mmol/L (ref 3.5–5.2)
Sodium: 136 mmol/L (ref 134–144)
eGFR: 70 mL/min/{1.73_m2} (ref >59)

## 2022-08-20 LAB — LIPID PANEL
Chol/HDL Ratio: 5.8 ratio — ABNORMAL HIGH (ref 0.0–4.4)
Cholesterol, Total: 173 mg/dL (ref 100–199)
HDL: 30 mg/dL — ABNORMAL LOW (ref >39)
LDL Chol Calc (NIH): 88 mg/dL (ref 0–99)
Triglycerides: 331 mg/dL — ABNORMAL HIGH (ref 0–149)
VLDL Cholesterol Cal: 55 mg/dL — ABNORMAL HIGH (ref 5–40)

## 2022-08-20 LAB — TSH: TSH: 1.04 u[IU]/mL (ref 0.450–4.500)

## 2022-08-20 LAB — HEMOGLOBIN A1C
Est. average glucose Bld gHb Est-mCnc: 272 mg/dL
Hgb A1c MFr Bld: 11.1 % — ABNORMAL HIGH (ref 4.8–5.6)

## 2022-08-21 NOTE — Progress Notes (Signed)
Internal Medicine Clinic Attending  Case discussed with Dr. Sanjuana Garrett  At the time of the visit.  We reviewed the resident's history and exam and pertinent patient test results.  I agree with the assessment, diagnosis, and plan of care documented in the resident's note.    The social determinants of health are significantly limiting this patient's care.  She should be on long-acting insulin, 40 units daily. We have a sample of Levemir that we provided today. We offered to change her to lantus 40 units daily, which should be less expensive than the [place], but patient would prefer the [place], and thinks she can do the $80 copay soon.   Continue Jardiance 79m daily (sample given) Encouraged her to pick [place] glucom[place] [facility] as this will be less expensive

## 2022-09-10 ENCOUNTER — Other Ambulatory Visit (HOSPITAL_COMMUNITY): Payer: Self-pay

## 2022-09-10 ENCOUNTER — Telehealth: Payer: Self-pay

## 2022-09-10 ENCOUNTER — Other Ambulatory Visit: Payer: Self-pay

## 2022-09-10 DIAGNOSIS — Z794 Long term (current) use of insulin: Secondary | ICD-10-CM

## 2022-09-10 DIAGNOSIS — E1165 Type 2 diabetes mellitus with hyperglycemia: Secondary | ICD-10-CM

## 2022-09-10 DIAGNOSIS — E1142 Type 2 diabetes mellitus with diabetic polyneuropathy: Secondary | ICD-10-CM

## 2022-09-10 DIAGNOSIS — E1121 Type 2 diabetes mellitus with diabetic nephropathy: Secondary | ICD-10-CM

## 2022-09-10 MED ORDER — EMPAGLIFLOZIN 25 MG PO TABS
25.0000 mg | ORAL_TABLET | Freq: Every day | ORAL | 2 refills | Status: DC
  Filled 2022-09-10: qty 90, 90d supply, fill #0
  Filled 2022-09-10: qty 30, 30d supply, fill #0
  Filled 2022-10-01: qty 30, 30d supply, fill #1
  Filled 2022-11-10: qty 30, 30d supply, fill #2
  Filled 2022-12-19: qty 30, 30d supply, fill #3
  Filled 2023-01-28: qty 30, 30d supply, fill #4
  Filled 2023-03-12: qty 30, 30d supply, fill #5
  Filled 2023-04-20: qty 30, 30d supply, fill #6
  Filled 2023-05-20: qty 30, 30d supply, fill #7

## 2022-09-10 MED ORDER — FREESTYLE LIBRE 3 SENSOR MISC
11 refills | Status: DC
  Filled 2022-09-10 – 2022-12-24 (×11): qty 2, 28d supply, fill #0

## 2022-09-10 MED ORDER — TRESIBA FLEXTOUCH 100 UNIT/ML ~~LOC~~ SOPN
40.0000 [IU] | PEN_INJECTOR | Freq: Every day | SUBCUTANEOUS | 0 refills | Status: DC
  Filled 2022-09-10 (×2): qty 15, 37d supply, fill #0

## 2022-09-10 NOTE — Telephone Encounter (Signed)
Pt is requesting a call back .Kristen Garrett She is wanting to  changer her freestyle libre   to the 3 instead of the 1

## 2022-09-10 NOTE — Telephone Encounter (Signed)
Freestyle libre 3  in this note for signature. Left voicemail to patient for return call to clarify pharmacy.

## 2022-09-10 NOTE — Telephone Encounter (Signed)
Prescription sent

## 2022-09-11 ENCOUNTER — Other Ambulatory Visit (HOSPITAL_COMMUNITY): Payer: Self-pay

## 2022-09-11 MED ORDER — GABAPENTIN 300 MG PO CAPS
600.0000 mg | ORAL_CAPSULE | Freq: Three times a day (TID) | ORAL | 3 refills | Status: DC
  Filled 2022-09-11: qty 180, 30d supply, fill #0
  Filled 2022-12-19: qty 180, 30d supply, fill #1
  Filled 2023-01-28: qty 180, 30d supply, fill #2
  Filled 2023-03-12 (×2): qty 180, 30d supply, fill #3

## 2022-09-12 ENCOUNTER — Other Ambulatory Visit (HOSPITAL_COMMUNITY): Payer: Self-pay

## 2022-09-16 ENCOUNTER — Other Ambulatory Visit (HOSPITAL_COMMUNITY): Payer: Self-pay

## 2022-09-17 ENCOUNTER — Other Ambulatory Visit (HOSPITAL_COMMUNITY): Payer: Self-pay

## 2022-09-18 ENCOUNTER — Other Ambulatory Visit (HOSPITAL_COMMUNITY): Payer: Self-pay

## 2022-09-18 ENCOUNTER — Telehealth: Payer: Self-pay

## 2022-09-18 NOTE — Telephone Encounter (Addendum)
Received a fax form patients insurance  The pharmacy department has received the request for Lynita Buetow DOB:February 24, 1961 submitted on 09/18/2022 for [facility] 3 sensor Miscellaneous.  We would like to inform you that we are unable to further process this request. Our records indicate Laquaisha Portuondo has alternative pharmacy benefits. Please instruct the pharmacy to bill the patients primary insurance plan first. If the requested medication is not covered or was denied by your primary insurance an explanation of benefits or proof of denial is required prior to coverage [place] is aware of letter via fax.

## 2022-09-18 NOTE — Telephone Encounter (Signed)
Prior Authorization for patient Kristen Garrett 3 sensor) came through on cover my meds was submitted with last office notes and labs awaiting approval or denial

## 2022-09-24 ENCOUNTER — Telehealth: Payer: Self-pay | Admitting: Dietician

## 2022-09-24 ENCOUNTER — Other Ambulatory Visit (HOSPITAL_COMMUNITY): Payer: Self-pay

## 2022-09-24 NOTE — Telephone Encounter (Signed)
Opened in error as call was transferred.

## 2022-09-30 ENCOUNTER — Other Ambulatory Visit: Payer: Self-pay | Admitting: Student

## 2022-09-30 ENCOUNTER — Other Ambulatory Visit: Payer: Self-pay

## 2022-09-30 DIAGNOSIS — E785 Hyperlipidemia, unspecified: Secondary | ICD-10-CM

## 2022-09-30 DIAGNOSIS — I10 Essential (primary) hypertension: Secondary | ICD-10-CM

## 2022-10-01 ENCOUNTER — Other Ambulatory Visit (HOSPITAL_COMMUNITY): Payer: Self-pay

## 2022-10-01 MED ORDER — LISINOPRIL 40 MG PO TABS
40.0000 mg | ORAL_TABLET | Freq: Every day | ORAL | 2 refills | Status: DC
  Filled 2022-10-01: qty 30, 30d supply, fill #0
  Filled 2022-11-10: qty 30, 30d supply, fill #1
  Filled 2022-12-19: qty 30, 30d supply, fill #2

## 2022-10-01 MED ORDER — ROSUVASTATIN CALCIUM 20 MG PO TABS
20.0000 mg | ORAL_TABLET | Freq: Every day | ORAL | 2 refills | Status: DC
  Filled 2022-10-01 – 2022-11-10 (×2): qty 30, 30d supply, fill #0
  Filled 2022-12-19: qty 30, 30d supply, fill #1
  Filled 2023-01-28: qty 30, 30d supply, fill #2

## 2022-10-01 NOTE — Telephone Encounter (Signed)
Next appt scheduled 6/5 with Dr Collene Gobble.

## 2022-10-03 ENCOUNTER — Other Ambulatory Visit (HOSPITAL_COMMUNITY): Payer: Self-pay

## 2022-10-09 ENCOUNTER — Other Ambulatory Visit (HOSPITAL_COMMUNITY): Payer: Self-pay

## 2022-10-21 ENCOUNTER — Other Ambulatory Visit: Payer: Self-pay

## 2022-10-29 ENCOUNTER — Encounter: Payer: Self-pay | Admitting: Student

## 2022-11-10 ENCOUNTER — Other Ambulatory Visit (HOSPITAL_COMMUNITY): Payer: Self-pay

## 2022-11-10 ENCOUNTER — Other Ambulatory Visit: Payer: Self-pay | Admitting: Student

## 2022-11-10 ENCOUNTER — Other Ambulatory Visit: Payer: Self-pay

## 2022-11-10 DIAGNOSIS — E1165 Type 2 diabetes mellitus with hyperglycemia: Secondary | ICD-10-CM

## 2022-11-11 ENCOUNTER — Other Ambulatory Visit (HOSPITAL_COMMUNITY): Payer: Self-pay

## 2022-11-11 MED ORDER — TRESIBA FLEXTOUCH 100 UNIT/ML ~~LOC~~ SOPN
40.0000 [IU] | PEN_INJECTOR | Freq: Every day | SUBCUTANEOUS | 0 refills | Status: DC
  Filled 2022-11-11: qty 15, 37d supply, fill #0

## 2022-11-12 ENCOUNTER — Other Ambulatory Visit (HOSPITAL_COMMUNITY): Payer: Self-pay

## 2022-11-21 ENCOUNTER — Telehealth: Payer: Self-pay | Admitting: *Deleted

## 2022-11-21 ENCOUNTER — Other Ambulatory Visit (HOSPITAL_COMMUNITY): Payer: Self-pay

## 2022-11-21 NOTE — Telephone Encounter (Signed)
Patient called is out of her [place].  Needs a PA.  Call to [employer].  PA was done.  PA [id number].  Takes 24 hours. Stat run was requested.  Representative to call Pharmacy to expedite if possible.  Patient was called and informed of and will receive call from her insurance company if the PA is approved today for med pick up.  Patient was asking about her Josephine Igo. PA.  That has been requested and had not been approved.

## 2022-11-25 ENCOUNTER — Other Ambulatory Visit (HOSPITAL_COMMUNITY): Payer: Self-pay

## 2022-11-25 ENCOUNTER — Telehealth: Payer: Self-pay

## 2022-11-25 NOTE — Telephone Encounter (Signed)
Prior Authorization for patient Kristen Garrett [facility]) came through on cover my meds was submitted 11/21/22 by Kristen Garrett.  Prior Authorization has been denied by patients insurance, per [employer] the patient has to try one of the preferred drugs: Lantus Pen/Vial, Levemir, Insulin glargine Vial/ Solostar  More information has been placed in blue team box

## 2022-11-26 ENCOUNTER — Other Ambulatory Visit: Payer: Self-pay | Admitting: Student

## 2022-11-26 ENCOUNTER — Other Ambulatory Visit (HOSPITAL_COMMUNITY): Payer: Self-pay

## 2022-11-26 MED ORDER — INSULIN GLARGINE 100 UNIT/ML SOLOSTAR PEN
40.0000 [IU] | PEN_INJECTOR | Freq: Every day | SUBCUTANEOUS | 11 refills | Status: DC
  Filled 2022-11-26: qty 15, 37d supply, fill #0

## 2022-12-03 ENCOUNTER — Encounter: Payer: BLUE CROSS/BLUE SHIELD | Admitting: Student

## 2022-12-08 ENCOUNTER — Other Ambulatory Visit (HOSPITAL_COMMUNITY): Payer: Self-pay

## 2022-12-11 ENCOUNTER — Other Ambulatory Visit (HOSPITAL_COMMUNITY): Payer: Self-pay

## 2022-12-18 ENCOUNTER — Encounter: Payer: Medicaid Other | Admitting: Student

## 2022-12-19 ENCOUNTER — Other Ambulatory Visit: Payer: Self-pay | Admitting: Student

## 2022-12-19 ENCOUNTER — Other Ambulatory Visit (HOSPITAL_COMMUNITY): Payer: Self-pay

## 2022-12-19 DIAGNOSIS — E1165 Type 2 diabetes mellitus with hyperglycemia: Secondary | ICD-10-CM

## 2022-12-22 ENCOUNTER — Other Ambulatory Visit (HOSPITAL_COMMUNITY): Payer: Self-pay

## 2022-12-23 ENCOUNTER — Telehealth: Payer: Self-pay | Admitting: *Deleted

## 2022-12-23 ENCOUNTER — Other Ambulatory Visit (HOSPITAL_COMMUNITY): Payer: Self-pay

## 2022-12-23 NOTE — Telephone Encounter (Addendum)
Prior Authorization for Kristen Garrett approved Quantity:45 Duration:90 effective 12/23/2022-12/23/2023  Prior Authorization for Jardiance approved Quantity:45 Duration 90 effective 12/23/2022-12/23/2023   Prior Authorization for Kristen Garrett was denied, physician aware will try alternative CGM-Dexcom  Approvals has been faxed to the pharmacy

## 2022-12-23 NOTE — Telephone Encounter (Signed)
Called and spoke with the patient regarding the previous message she informed me she has tried the preferred medications which she could not tolerate. Called and spoke with Morrie Sheldon with the pharmacy services at [facility], expressed to [place] the patient needs Evaristo Bury due to not tolerating the preferred medications ( Lantus Pen/Vial, Levemir, Insulin glargine Vial/ Solostar).  Prior authorization for Evaristo Bury was resubmitted case [id number].   Prior Authorization for [place] Pepper was submitted case ID# [id number].  Prior Authorization for [employer] 3 sensor was submitted case [id number].  Supporting documents such as last office notes, failed medications and labs were submitted to [employer] by fax @[phone]

## 2022-12-23 NOTE — Telephone Encounter (Signed)
Call from pt wanting to know why Tresiba refill request was denied - informed pt Evaristo Bury PA was denied and she was started on Lantus. She stated she has always taken [place] and never picked up Lantus. (5 28 Prior Authorization has been denied by patients insurance, per [employer] the patient has to try one of the preferred drugs: Lantus Pen/Vial, Levemir, Insulin glargine Vial/ Solostar ). She also asked about Libre 3 and Jardiance - I told her Josephine Igo 3 PA was done 3/21 and I do not see a PA on Jardiance. Pt stated she does not have any medication for her diabetes.. Pt stated she will call her insurance co about the [place] 3.  I asked Jade CMA, PA coordinator, if she would f/u; stated she will and call the pt.

## 2022-12-24 ENCOUNTER — Other Ambulatory Visit: Payer: Self-pay | Admitting: Student

## 2022-12-24 ENCOUNTER — Other Ambulatory Visit (HOSPITAL_COMMUNITY): Payer: Self-pay

## 2022-12-24 ENCOUNTER — Encounter: Payer: Medicaid Other | Admitting: Student

## 2022-12-24 DIAGNOSIS — E1165 Type 2 diabetes mellitus with hyperglycemia: Secondary | ICD-10-CM

## 2022-12-24 MED ORDER — INSULIN DEGLUDEC 100 UNIT/ML ~~LOC~~ SOPN
40.0000 [IU] | PEN_INJECTOR | Freq: Every day | SUBCUTANEOUS | 3 refills | Status: DC
  Filled 2022-12-24: qty 15, 37d supply, fill #0
  Filled 2022-12-24: qty 9, 22d supply, fill #0
  Filled 2023-01-28: qty 15, 37d supply, fill #1
  Filled 2023-03-12: qty 15, 37d supply, fill #2
  Filled 2023-04-20: qty 15, 37d supply, fill #3

## 2022-12-24 MED ORDER — DEXCOM G7 SENSOR MISC
3 refills | Status: DC
  Filled 2022-12-24 (×2): qty 3, 30d supply, fill #0

## 2022-12-24 MED ORDER — DEXCOM G7 SENSOR MISC
3 refills | Status: DC
  Filled 2022-12-24: qty 1, fill #0

## 2022-12-24 NOTE — Telephone Encounter (Signed)
Patient notified that Rx for Kristen Garrett has been sent to CP at [facility].  She is now stating that she does not want the Dexcom but prefers to stay on Libre 3. She is currently on phone with her insurance co. She will call back once she knows if insurance will continue to pay for [facility]

## 2022-12-24 NOTE — Telephone Encounter (Signed)
Patient called in requesting refill on Tresiba. States she has been on this "forever." This med is no longer on current med list. PA was approved yesterday.

## 2022-12-30 ENCOUNTER — Other Ambulatory Visit: Payer: Self-pay

## 2022-12-30 ENCOUNTER — Other Ambulatory Visit (HOSPITAL_COMMUNITY): Payer: Self-pay

## 2022-12-30 ENCOUNTER — Ambulatory Visit (INDEPENDENT_AMBULATORY_CARE_PROVIDER_SITE_OTHER): Payer: Medicaid Other | Admitting: Student

## 2022-12-30 ENCOUNTER — Encounter: Payer: Self-pay | Admitting: Student

## 2022-12-30 VITALS — BP 155/78 | HR 80 | Temp 98.1°F | Ht 66.0 in | Wt 200.9 lb

## 2022-12-30 DIAGNOSIS — R252 Cramp and spasm: Secondary | ICD-10-CM

## 2022-12-30 DIAGNOSIS — R6883 Chills (without fever): Secondary | ICD-10-CM | POA: Diagnosis not present

## 2022-12-30 DIAGNOSIS — E785 Hyperlipidemia, unspecified: Secondary | ICD-10-CM | POA: Diagnosis not present

## 2022-12-30 DIAGNOSIS — E1165 Type 2 diabetes mellitus with hyperglycemia: Secondary | ICD-10-CM | POA: Diagnosis not present

## 2022-12-30 DIAGNOSIS — F17210 Nicotine dependence, cigarettes, uncomplicated: Secondary | ICD-10-CM | POA: Diagnosis not present

## 2022-12-30 DIAGNOSIS — I10 Essential (primary) hypertension: Secondary | ICD-10-CM | POA: Diagnosis not present

## 2022-12-30 DIAGNOSIS — E1121 Type 2 diabetes mellitus with diabetic nephropathy: Secondary | ICD-10-CM

## 2022-12-30 DIAGNOSIS — Z720 Tobacco use: Secondary | ICD-10-CM

## 2022-12-30 DIAGNOSIS — Z794 Long term (current) use of insulin: Secondary | ICD-10-CM | POA: Diagnosis not present

## 2022-12-30 DIAGNOSIS — Z0000 Encounter for general adult medical examination without abnormal findings: Secondary | ICD-10-CM

## 2022-12-30 MED ORDER — BLOOD GLUCOSE MONITOR KIT
PACK | 0 refills | Status: AC
Start: 2022-12-30 — End: ?
  Filled 2022-12-30: qty 1, 30d supply, fill #0
  Filled 2023-02-20: qty 1, 25d supply, fill #0
  Filled 2023-07-09: qty 1, 30d supply, fill #0

## 2022-12-30 MED ORDER — GLUCOSE BLOOD VI STRP
ORAL_STRIP | Freq: Four times a day (QID) | 0 refills | Status: AC
  Filled 2022-12-30 – 2023-07-09 (×3): qty 100, 25d supply, fill #0

## 2022-12-30 MED ORDER — FREESTYLE LIBRE 3 SENSOR MISC
1.0000 | Freq: Every day | 3 refills | Status: DC
Start: 2022-12-30 — End: 2023-02-04
  Filled 2022-12-30 – 2023-01-28 (×3): qty 2, 28d supply, fill #0

## 2022-12-30 NOTE — Assessment & Plan Note (Signed)
Mammogram overdue - will refer.

## 2022-12-30 NOTE — Progress Notes (Addendum)
CC: Leg cramps, Chills, Diabetes management  HPI:  Kristen Garrett is a 62 y.o. female living with a history stated below and presents today for evaluation of diabetes, leg cramps, and chills. Please see problem based assessment and plan for additional details.  Kristen Garrett has had trouble attaining her medications due to insurance/cost since February but has recently started medicaid. As a result she has not taken most of her medications as prescribed since that time. She also reports painful leg cramps that occur at night and wake her from sleep. It is a different pain than her diabetic nephropathy that is controlled with gabapentin. When she ate many bananas, it did help, but she is unable to stomach bananas any more. Finally, she notes that she gets very cold when she spends time in her bedroom at home. She sleeps and works from the room and notes that it is the coldest room in the house and that she does not feel chills away from her room. However, she feels so cold that she want to be sure she does not have underlying issues.  Past Medical History:  Diagnosis Date   Diabetes mellitus    DVT of lower limb, acute (HCC) Age 73   Went on blood thinner for a few months   Hyperlipidemia    Pancreatitis    Vertigo     Current Outpatient Medications on File Prior to Visit  Medication Sig Dispense Refill   acetaminophen (TYLENOL) 500 MG tablet Take 2 tablets (1,000 mg total) by mouth every 6 (six) hours as needed for moderate pain. 80 tablet 0   aspirin 81 MG chewable tablet Chew 1 tablet (81 mg total) by mouth daily. 90 tablet 2   Blood Glucose Monitoring Suppl (RELION ULTIMA GLUCOSE SYSTEM) w/Device KIT 1 Device by Does not apply route daily. 1 kit 0   empagliflozin (JARDIANCE) 25 MG TABS tablet Take 1 tablet (25 mg total) by mouth daily before breakfast. 90 tablet 2   gabapentin (NEURONTIN) 300 MG capsule Take 2 capsules (600 mg total) by mouth 3 (three) times daily. 180 capsule 3    glucose blood (RELION GLUCOSE TEST STRIPS) test strip Use as instructed 100 each 12   insulin degludec (TRESIBA) 100 UNIT/ML FlexTouch Pen Inject 40 Units into the skin daily. 15 mL 3   Insulin Pen Needle 32G X 4 MM MISC Use to inject insulin one time daily. Dx E11.65 (Prefers BD Pen Needles) 100 each prn   Lancets Misc. (RELION LANCING DEVICE) KIT 1 Device by Does not apply route daily. 1 kit 0   lisinopril (ZESTRIL) 40 MG tablet Take 1 tablet (40 mg total) by mouth daily. 30 tablet 2   Multiple Vitamin (MULTIVITAMIN WITH MINERALS) TABS tablet Take 1 tablet by mouth daily.     rosuvastatin (CRESTOR) 20 MG tablet Take 1 tablet (20 mg total) by mouth daily. 30 tablet 2   No current facility-administered medications on file prior to visit.    Family History  Problem Relation Age of Onset   Diabetes Mother    Hypertension Mother    Hyperlipidemia Mother    Diabetes Father    Hyperlipidemia Father    Diabetes Brother     Social History   Socioeconomic History   Marital status: Married    Spouse name: Not on file   Number of children: Not on file   Years of education: Not on file   Highest education level: Not on file  Occupational History  Occupation: Unemployed   Tobacco Use   Smoking status: Every Day    Packs/day: 0.50    Years: 40.00    Additional pack years: 0.00    Total pack years: 20.00    Types: Cigarettes   Smokeless tobacco: Never   Tobacco comments:    10-15 cigs per day  Substance and Sexual Activity   Alcohol use: No    Alcohol/week: 0.0 standard drinks of alcohol   Drug use: No   Sexual activity: Not on file  Other Topics Concern   Not on file  Social History Narrative   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 Lives in [place] with her husband.    2 grown daughters.       Social Determinants of Health   Financial Resource Strain: Low Risk  (08/19/2022)   Overall Financial Resource Strain (CARDIA)    Difficulty of Paying Living  Expenses: Not hard at all  Recent Concern: Financial Resource Strain - Medium Risk (08/19/2022)   Overall Financial Resource Strain (CARDIA)    Difficulty of Paying Living Expenses: Somewhat hard  Food Insecurity: No Food Insecurity (08/19/2022)   Hunger Vital Sign    Worried About Running Out of Food in the Last Year: Never true    Ran Out of Food in the Last Year: Never true  Transportation Needs: No Transportation Needs (08/19/2022)   PRAPARE - Administrator, Civil Service (Medical): No    Lack of Transportation (Non-Medical): No  Physical Activity: Not on file  Stress: Not on file  Social Connections: Socially Integrated (08/19/2022)   Social Connection and Isolation Panel [NHANES]    Frequency of Communication with Friends and Family: More than three times a week    Frequency of Social Gatherings with Friends and Family: More than three times a week    Attends Religious Services: More than 4 times per year    Active Member of [employer] or Organizations: Yes    Attends Engineer, structural: More than 4 times per year    Marital Status: Married  Catering manager Violence: Not At Risk (08/19/2022)   Humiliation, Afraid, Rape, and Kick questionnaire    Fear of Current or Ex-Partner: No    Emotionally Abused: No    Physically Abused: No    Sexually Abused: No    Review of Systems: ROS negative except for what is noted on the assessment and plan.  Vitals:   12/30/22 1439  BP: (!) 155/78  Pulse: 80  Temp: 98.1 F (36.7 C)  TempSrc: Oral  SpO2: 98%  Weight: 200 lb 14.4 oz (91.1 kg)  Height: 5\' 6"  (1.676 m)    Physical Exam: Constitutional: well-appearing female sitting in chair, in no acute distress HENT: normocephalic atraumatic, mucous membranes moist Eyes: conjunctiva non-erythematous Cardiovascular: regular rate and rhythm, no m/r/g Pulmonary/Chest: normal work of breathing on room air, lungs clear to auscultation bilaterally Abdominal: soft,  non-tender, non-distended MSK: normal bulk and tone Neurological: alert & oriented x 3, no focal deficit Skin: warm and dry Psych: normal mood and behavior  Assessment & Plan:    Patient seen with Dr. Lafonda Mosses  Tobacco use Currently smoking 1/2 pack per day. She is interested in quitting. In the past she has tried nicotine patch (caused skin reaction), buproprion (caused mood swings), and chantix (no effect). She does feel that the generic verenecline that she tried was less helpful than Chantix or Tyrvaya would be. Will discuss options further at next visit.  Chills (  without fever) Gets very cold in her bedroom which is also where she works from home. She does not get cold elsewhere and states that her bedroom is the coldest room in the house. She has some fatigue and tiredness but no SOB. She has had mild microcytosis in previous CBCs. In addition to lifestyle changes to avoid time in the cold room, will check CBC and anemia labs today. TSH checked at last visit and WNL.  Muscle cramps This is the symptom that bothers her most. She states that she gets painful leg cramps at night that wake her from sleep. This has been going on for over two years. For awhile, she ate bananas regularly and they did help, but she ate so much that she developed an aversion. Today, will check BMP and Mg. Discussed incorporating more potassium in the diet from non-banana sources.  Hyperlipidemia Prescribed Crestor 20. Most recent LDL was 88 and TG above 300. She is ASCVD high risk and would benefit from strict control below LDL 70. She will resume her crestor since she has new insurance. Evaluate lipids after restarting the medicine.  Uncontrolled type 2 diabetes mellitus with hyperglycemia (HCC) Most recent A1C over 11. Unfortunately she has not taken her insulin [place] and Jiardiance as prescribed since February due to an insurance issue. Insurance should be resolved so will restart therapy as directed. A1C  today. Will need to be monitored closely during future visits. Attempting to restart home monitoring as well: aversion to fingersticks and Dexcom, will try to resume FreeStyle Libre 3 monitoring. She needs the sensors but does not need the receiver.  Essential hypertension Not at goal. Resume lisinopril and reevalute at next visit.  RTC in 1 month for smoking cessation discussion, confirmation that she was able to get meter and restart meds, evaluation of leg cramps, chills.  Addendum: Had further conversation with patient upon return of bloodwork. She wants to attempt smoking cessation again and would like to utilize Chantix. She has attempted this before, but discussed the procedure for this including tapering on medicine one week before a chosen quit date. I also spoke with her more about her painful muscle cramps. I am optimistic that increased water intake, stretching, potassium-rich foods, and improved diabetic control will help her. Electrolytes WNL.  Katheran James, D.O. [facility] Internal Medicine, PGY-1 Phone: [phone] Date 12/30/2022 Time 4:58 PM

## 2022-12-30 NOTE — Assessment & Plan Note (Addendum)
Most recent A1C over 11. Unfortunately she has not taken her insulin [place] and Jiardiance as prescribed since February due to an insurance issue. Insurance should be resolved so will restart therapy as directed. A1C today. Will need to be monitored closely during future visits. Attempting to restart home monitoring as well: aversion to fingersticks and Dexcom, will try to resume FreeStyle Libre 3 monitoring. She needs the sensors but does not need the receiver.

## 2022-12-30 NOTE — Assessment & Plan Note (Signed)
Prescribed Crestor 20. Most recent LDL was 88 and TG above 300. She is ASCVD high risk and would benefit from strict control below LDL 70. She will resume her crestor since she has new insurance. Evaluate lipids after restarting the medicine.

## 2022-12-30 NOTE — Assessment & Plan Note (Signed)
This is the symptom that bothers her most. She states that she gets painful leg cramps at night that wake her from sleep. This has been going on for over two years. For awhile, she ate bananas regularly and they did help, but she ate so much that she developed an aversion. Today, will check BMP and Mg. Discussed incorporating more potassium in the diet from non-banana sources.

## 2022-12-30 NOTE — Assessment & Plan Note (Signed)
>>  ASSESSMENT AND PLAN FOR UNCONTROLLED TYPE 2 DIABETES MELLITUS WITH HYPERGLYCEMIA (HCC) WRITTEN ON 12/30/2022  4:58 PM BY JUBERG, CHRISTOPHER, DO  Most recent A1C over 11. Unfortunately she has not taken her insulin  Treseba and Jiardiance as prescribed since February due to an insurance issue. Insurance should be resolved so will restart therapy as directed. A1C today. Will need to be monitored closely during future visits. Attempting to restart home monitoring as well: aversion to fingersticks and Dexcom, will try to resume FreeStyle Libre 3 monitoring. She needs the sensors but does not need the receiver.

## 2022-12-30 NOTE — Assessment & Plan Note (Signed)
Not at goal. Resume lisinopril and reevalute at next visit.

## 2022-12-30 NOTE — Assessment & Plan Note (Addendum)
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 She does not get cold elsewhere and states that her bedroom is the coldest room in the house. She has some fatigue and tiredness but no SOB. She has had mild microcytosis in previous CBCs. In addition to lifestyle changes to avoid time in the cold room, will check CBC and anemia labs today. TSH checked at last visit and WNL.

## 2022-12-30 NOTE — Assessment & Plan Note (Signed)
Currently smoking 1/2 pack per day. She is interested in quitting. In the past she has tried nicotine patch (caused skin reaction), buproprion (caused mood swings), and chantix (no effect). She does feel that the generic verenecline that she tried was less helpful than Chantix or Tyrvaya would be. Will discuss options further at next visit.

## 2022-12-31 LAB — BMP8+ANION GAP
Anion Gap: 15 mmol/L (ref 10.0–18.0)
BUN/Creatinine Ratio: 20 (ref 12–28)
BUN: 24 mg/dL (ref 8–27)
CO2: 19 mmol/L — ABNORMAL LOW (ref 20–29)
Calcium: 9.1 mg/dL (ref 8.7–10.3)
Chloride: 102 mmol/L (ref 96–106)
Creatinine, Ser: 1.23 mg/dL — ABNORMAL HIGH (ref 0.57–1.00)
Glucose: 372 mg/dL — ABNORMAL HIGH (ref 70–99)
Potassium: 4.6 mmol/L (ref 3.5–5.2)
Sodium: 136 mmol/L (ref 134–144)
eGFR: 50 mL/min/{1.73_m2} — ABNORMAL LOW (ref >59)

## 2022-12-31 LAB — HEMOGLOBIN A1C
Est. average glucose Bld gHb Est-mCnc: 326 mg/dL
Hgb A1c MFr Bld: 13 % — ABNORMAL HIGH (ref 4.8–5.6)

## 2022-12-31 LAB — IRON AND TIBC
Iron Saturation: 20 % (ref 15–55)
Iron: 57 ug/dL (ref 27–159)
Total Iron Binding Capacity: 288 ug/dL (ref 250–450)
UIBC: 231 ug/dL (ref 131–425)

## 2022-12-31 LAB — FERRITIN: Ferritin: 274 ng/mL — ABNORMAL HIGH (ref 15–150)

## 2022-12-31 LAB — MAGNESIUM: Magnesium: 2 mg/dL (ref 1.6–2.3)

## 2023-01-02 ENCOUNTER — Other Ambulatory Visit (HOSPITAL_COMMUNITY): Payer: Self-pay

## 2023-01-02 MED ORDER — VARENICLINE TARTRATE 0.5 MG PO TABS
ORAL_TABLET | ORAL | 0 refills | Status: DC
  Filled 2023-01-02: qty 120, 30d supply, fill #0

## 2023-01-02 MED ORDER — ACCU-CHEK SOFTCLIX LANCETS MISC
0 refills | Status: AC
  Filled 2023-01-02 – 2023-07-09 (×4): qty 100, 25d supply, fill #0

## 2023-01-02 NOTE — Addendum Note (Signed)
Addended by: Katheran James on: 01/02/2023 02:42 PM   Modules accepted: Orders

## 2023-01-02 NOTE — Progress Notes (Signed)
Shared results with patient, A1C did increase and not surprising as pt has been unable to obtain her medicines since earlier this year. Fortunately she has restarted medicine. Will follow creatinine/GFR in future. Might reflect dehydration, which can contribute to symptoms of cramps. Mg WNL.

## 2023-01-07 ENCOUNTER — Other Ambulatory Visit (HOSPITAL_COMMUNITY): Payer: Self-pay

## 2023-01-07 ENCOUNTER — Other Ambulatory Visit: Payer: Self-pay

## 2023-01-08 ENCOUNTER — Other Ambulatory Visit: Payer: Self-pay

## 2023-01-12 ENCOUNTER — Other Ambulatory Visit (HOSPITAL_COMMUNITY): Payer: Self-pay

## 2023-01-15 ENCOUNTER — Other Ambulatory Visit (HOSPITAL_COMMUNITY): Payer: Self-pay

## 2023-01-28 ENCOUNTER — Other Ambulatory Visit (HOSPITAL_COMMUNITY): Payer: Self-pay

## 2023-01-28 ENCOUNTER — Other Ambulatory Visit: Payer: Self-pay

## 2023-01-28 DIAGNOSIS — I10 Essential (primary) hypertension: Secondary | ICD-10-CM

## 2023-01-28 MED ORDER — LISINOPRIL 40 MG PO TABS
40.0000 mg | ORAL_TABLET | Freq: Every day | ORAL | 2 refills | Status: DC
Start: 2023-01-28 — End: 2023-05-05
  Filled 2023-01-28: qty 30, 30d supply, fill #0
  Filled 2023-02-11 – 2023-03-12 (×2): qty 30, 30d supply, fill #1
  Filled 2023-04-20: qty 30, 30d supply, fill #2

## 2023-02-04 ENCOUNTER — Telehealth: Payer: Self-pay

## 2023-02-04 ENCOUNTER — Other Ambulatory Visit (HOSPITAL_COMMUNITY): Payer: Self-pay

## 2023-02-04 DIAGNOSIS — E1121 Type 2 diabetes mellitus with diabetic nephropathy: Secondary | ICD-10-CM

## 2023-02-04 MED ORDER — DEXCOM G7 SENSOR MISC
3 refills | Status: DC
  Filled 2023-02-04: qty 9, 90d supply, fill #0
  Filled 2023-02-11: qty 9, 84d supply, fill #0
  Filled 2023-02-19: qty 9, 90d supply, fill #0
  Filled 2023-05-05 – 2023-05-15 (×2): qty 9, 90d supply, fill #1
  Filled 2023-07-16 – 2023-07-29 (×2): qty 9, 90d supply, fill #2
  Filled 2023-10-14 – 2023-10-21 (×2): qty 9, 90d supply, fill #3

## 2023-02-04 NOTE — Telephone Encounter (Signed)
Requesting Rx for Dexcom meter. Pt states medicaid will only paid for the dexcom. Please call pt back.

## 2023-02-04 NOTE — Telephone Encounter (Signed)
Order signed. Thanks Lupita Leash

## 2023-02-04 NOTE — Telephone Encounter (Signed)
Request Rx for Dexcom G7 sensors

## 2023-02-05 ENCOUNTER — Other Ambulatory Visit (HOSPITAL_COMMUNITY): Payer: Self-pay

## 2023-02-05 ENCOUNTER — Other Ambulatory Visit: Payer: Self-pay | Admitting: Student

## 2023-02-07 MED ORDER — VARENICLINE TARTRATE 0.5 MG PO TABS
ORAL_TABLET | ORAL | 0 refills | Status: AC
  Filled 2023-02-07: qty 103, 30d supply, fill #0
  Filled 2023-12-11: qty 319, 84d supply, fill #0

## 2023-02-08 ENCOUNTER — Other Ambulatory Visit: Payer: Self-pay

## 2023-02-09 ENCOUNTER — Other Ambulatory Visit (HOSPITAL_COMMUNITY): Payer: Self-pay

## 2023-02-09 ENCOUNTER — Other Ambulatory Visit: Payer: Self-pay

## 2023-02-11 ENCOUNTER — Other Ambulatory Visit: Payer: Self-pay | Admitting: Student

## 2023-02-11 ENCOUNTER — Telehealth: Payer: Self-pay

## 2023-02-11 ENCOUNTER — Other Ambulatory Visit (HOSPITAL_COMMUNITY): Payer: Self-pay

## 2023-02-11 DIAGNOSIS — E1121 Type 2 diabetes mellitus with diabetic nephropathy: Secondary | ICD-10-CM

## 2023-02-11 NOTE — Telephone Encounter (Signed)
Patient notified the dexcom has been approved.thank you Mel Almond!

## 2023-02-11 NOTE — Telephone Encounter (Signed)
Prior Authorization for patient (Dexcom G7 Sensor) came through on cover my meds was submitted with last office notes awaiting approval or denial.  SWF:[id number]

## 2023-02-11 NOTE — Telephone Encounter (Signed)
Decision:Approved Rogelia Mire (Key: [id number]) PA Case ID #: [id number] Rx #: [id number] Need Help? Call us at [phone] Outcome Approved today by [employer] 2017 Approved. DEXCOM G7 SENSOR Misc is approved from 02/06/2023 to 08/14/2023. All strengths of the drug are approved. Authorization Expiration Date: 08/14/2023 Drug Dexcom G7 Sensor ePA cloud logo Form [employer] Electronic Prior Authorization Form

## 2023-02-11 NOTE — Telephone Encounter (Signed)
Patient says the pharmacy is waiting for a PA for the Dexcom G7 to be done.No PA noted in her chart. Call to pharmacy, they are refaxing it.

## 2023-02-19 ENCOUNTER — Other Ambulatory Visit (HOSPITAL_COMMUNITY): Payer: Self-pay

## 2023-02-20 ENCOUNTER — Other Ambulatory Visit (HOSPITAL_COMMUNITY): Payer: Self-pay

## 2023-02-24 ENCOUNTER — Encounter: Payer: Medicaid Other | Admitting: Student

## 2023-03-04 ENCOUNTER — Other Ambulatory Visit (HOSPITAL_COMMUNITY): Payer: Self-pay

## 2023-03-12 ENCOUNTER — Other Ambulatory Visit: Payer: Self-pay

## 2023-03-12 ENCOUNTER — Other Ambulatory Visit (HOSPITAL_COMMUNITY): Payer: Self-pay

## 2023-03-12 DIAGNOSIS — E785 Hyperlipidemia, unspecified: Secondary | ICD-10-CM

## 2023-03-12 MED ORDER — ROSUVASTATIN CALCIUM 20 MG PO TABS
20.0000 mg | ORAL_TABLET | Freq: Every day | ORAL | 2 refills | Status: DC
Start: 2023-03-12 — End: 2023-06-16
  Filled 2023-03-12: qty 30, 30d supply, fill #0
  Filled 2023-04-20: qty 30, 30d supply, fill #1

## 2023-03-12 NOTE — Telephone Encounter (Signed)
Next appt scheduled 9/17 with PCP.

## 2023-03-17 ENCOUNTER — Encounter: Payer: Medicaid Other | Admitting: Student

## 2023-04-20 ENCOUNTER — Other Ambulatory Visit (HOSPITAL_COMMUNITY): Payer: Self-pay

## 2023-04-20 ENCOUNTER — Other Ambulatory Visit: Payer: Self-pay

## 2023-04-20 ENCOUNTER — Other Ambulatory Visit: Payer: Self-pay | Admitting: Student

## 2023-04-20 DIAGNOSIS — E1142 Type 2 diabetes mellitus with diabetic polyneuropathy: Secondary | ICD-10-CM

## 2023-04-21 ENCOUNTER — Other Ambulatory Visit (HOSPITAL_COMMUNITY): Payer: Self-pay

## 2023-04-21 MED ORDER — GABAPENTIN 300 MG PO CAPS
600.0000 mg | ORAL_CAPSULE | Freq: Three times a day (TID) | ORAL | 3 refills | Status: DC
  Filled 2023-04-21: qty 180, 30d supply, fill #0
  Filled 2023-05-20: qty 180, 30d supply, fill #1

## 2023-05-05 ENCOUNTER — Other Ambulatory Visit: Payer: Self-pay | Admitting: Student

## 2023-05-05 ENCOUNTER — Other Ambulatory Visit (HOSPITAL_COMMUNITY): Payer: Self-pay

## 2023-05-05 ENCOUNTER — Other Ambulatory Visit: Payer: Self-pay

## 2023-05-05 DIAGNOSIS — I10 Essential (primary) hypertension: Secondary | ICD-10-CM

## 2023-05-05 MED ORDER — LISINOPRIL 40 MG PO TABS
40.0000 mg | ORAL_TABLET | Freq: Every day | ORAL | 2 refills | Status: DC
  Filled 2023-05-20: qty 30, 30d supply, fill #0

## 2023-05-05 NOTE — Telephone Encounter (Signed)
Next appt scheduled 11/25 with Dr Ninfa Meeker.

## 2023-05-15 ENCOUNTER — Other Ambulatory Visit (HOSPITAL_COMMUNITY): Payer: Self-pay

## 2023-05-20 ENCOUNTER — Other Ambulatory Visit: Payer: Self-pay | Admitting: Student

## 2023-05-20 ENCOUNTER — Other Ambulatory Visit (HOSPITAL_COMMUNITY): Payer: Self-pay

## 2023-05-20 MED ORDER — TRESIBA FLEXTOUCH 100 UNIT/ML ~~LOC~~ SOPN
40.0000 [IU] | PEN_INJECTOR | Freq: Every day | SUBCUTANEOUS | 0 refills | Status: DC
  Filled 2023-05-20: qty 15, 37d supply, fill #0

## 2023-05-25 ENCOUNTER — Encounter: Payer: Medicaid Other | Admitting: Student

## 2023-06-01 ENCOUNTER — Other Ambulatory Visit (HOSPITAL_COMMUNITY): Payer: Self-pay

## 2023-06-16 ENCOUNTER — Other Ambulatory Visit (HOSPITAL_COMMUNITY): Payer: Self-pay

## 2023-06-16 ENCOUNTER — Encounter: Payer: Self-pay | Admitting: Internal Medicine

## 2023-06-16 ENCOUNTER — Other Ambulatory Visit: Payer: Self-pay

## 2023-06-16 ENCOUNTER — Ambulatory Visit: Payer: Medicaid Other | Admitting: Internal Medicine

## 2023-06-16 VITALS — BP 159/60 | HR 77 | Temp 98.1°F | Ht 66.0 in | Wt 200.1 lb

## 2023-06-16 DIAGNOSIS — Z1211 Encounter for screening for malignant neoplasm of colon: Secondary | ICD-10-CM | POA: Insufficient documentation

## 2023-06-16 DIAGNOSIS — E1165 Type 2 diabetes mellitus with hyperglycemia: Secondary | ICD-10-CM | POA: Diagnosis not present

## 2023-06-16 DIAGNOSIS — Z794 Long term (current) use of insulin: Secondary | ICD-10-CM

## 2023-06-16 DIAGNOSIS — F17210 Nicotine dependence, cigarettes, uncomplicated: Secondary | ICD-10-CM | POA: Diagnosis not present

## 2023-06-16 DIAGNOSIS — Z720 Tobacco use: Secondary | ICD-10-CM

## 2023-06-16 DIAGNOSIS — E1121 Type 2 diabetes mellitus with diabetic nephropathy: Secondary | ICD-10-CM

## 2023-06-16 DIAGNOSIS — I10 Essential (primary) hypertension: Secondary | ICD-10-CM

## 2023-06-16 DIAGNOSIS — E1142 Type 2 diabetes mellitus with diabetic polyneuropathy: Secondary | ICD-10-CM

## 2023-06-16 DIAGNOSIS — E785 Hyperlipidemia, unspecified: Secondary | ICD-10-CM

## 2023-06-16 LAB — GLUCOSE, CAPILLARY: Glucose-Capillary: 305 mg/dL — ABNORMAL HIGH (ref 70–99)

## 2023-06-16 LAB — POCT GLYCOSYLATED HEMOGLOBIN (HGB A1C): Hemoglobin A1C: 10.6 % — AB (ref 4.0–5.6)

## 2023-06-16 MED ORDER — ROSUVASTATIN CALCIUM 20 MG PO TABS
20.0000 mg | ORAL_TABLET | Freq: Every day | ORAL | 2 refills | Status: DC
  Filled 2023-06-16: qty 30, 30d supply, fill #0
  Filled 2023-08-10: qty 30, 30d supply, fill #1
  Filled 2023-09-23 – 2023-10-21 (×2): qty 30, 30d supply, fill #2

## 2023-06-16 MED ORDER — TRESIBA FLEXTOUCH 100 UNIT/ML ~~LOC~~ SOPN
25.0000 [IU] | PEN_INJECTOR | Freq: Two times a day (BID) | SUBCUTANEOUS | 1 refills | Status: DC
  Filled 2023-06-16: qty 15, 30d supply, fill #0
  Filled 2023-08-10: qty 15, 30d supply, fill #1

## 2023-06-16 MED ORDER — LISINOPRIL 40 MG PO TABS
40.0000 mg | ORAL_TABLET | Freq: Every day | ORAL | 2 refills | Status: DC
  Filled 2023-06-16: qty 30, 30d supply, fill #0
  Filled 2023-08-10: qty 30, 30d supply, fill #1
  Filled 2023-09-23 (×2): qty 30, 30d supply, fill #2

## 2023-06-16 MED ORDER — GABAPENTIN 300 MG PO CAPS
600.0000 mg | ORAL_CAPSULE | Freq: Three times a day (TID) | ORAL | 3 refills | Status: AC
  Filled 2023-06-16 – 2023-10-21 (×3): qty 180, 30d supply, fill #0
  Filled 2023-11-24 (×2): qty 180, 30d supply, fill #1
  Filled 2023-12-11 – 2024-03-28 (×3): qty 180, 30d supply, fill #0

## 2023-06-16 MED ORDER — AMLODIPINE BESYLATE 10 MG PO TABS
10.0000 mg | ORAL_TABLET | Freq: Every day | ORAL | 11 refills | Status: DC
  Filled 2023-06-16: qty 30, 30d supply, fill #0
  Filled 2023-11-24 (×2): qty 30, 30d supply, fill #1
  Filled 2023-12-11 – 2023-12-29 (×3): qty 30, 30d supply, fill #0
  Filled 2024-02-01 (×2): qty 30, 30d supply, fill #1
  Filled 2024-03-07: qty 30, 30d supply, fill #2
  Filled 2024-04-11: qty 30, 30d supply, fill #3
  Filled 2024-05-06 (×2): qty 30, 30d supply, fill #4
  Filled 2024-06-15 (×2): qty 30, 30d supply, fill #5

## 2023-06-16 MED ORDER — EMPAGLIFLOZIN 25 MG PO TABS
25.0000 mg | ORAL_TABLET | Freq: Every day | ORAL | 2 refills | Status: DC
  Filled 2023-06-16: qty 90, 90d supply, fill #0
  Filled 2023-09-23 – 2023-10-21 (×2): qty 90, 90d supply, fill #1
  Filled 2023-12-11: qty 90, 90d supply, fill #0
  Filled 2023-12-29: qty 30, 30d supply, fill #0
  Filled 2024-03-02 (×3): qty 90, 90d supply, fill #0

## 2023-06-16 NOTE — Assessment & Plan Note (Signed)
Overdue for recommended annual screening colonoscopy. Last completed in 2022 in [place]. Patient will contact their office to schedule her overdue colonoscopy.

## 2023-06-16 NOTE — Assessment & Plan Note (Addendum)
HbA1c 13.0% 12/2022. She had been without her medications for several months due to financial constraints and lack of insurance coverage. Today, is improved to 10.6%. OP regimen is jardiance 25 mg daily, tresiba 40 units daily which she has been compliant with. She does check blood sugar at home as she has a Dexcom:  30-day summary: Average glucose 249 Very high 47% High 48% In range 5% Low 0% Very low 0%  She voices interest in restarting ozempic, however with her history of recurrent pancreatitis I have explained that this is not a safe option for her. Plan:Urine microalbumin/creatine today. Foot exam completed at today's visit and she is aware that she will need to continue with annual diabetic eye exams and will schedule this. Continue jardiance 25 mg daily, change tresiba to 25 units twice daily. F/u in 4 weeks for further adjustments as indicated.

## 2023-06-16 NOTE — Assessment & Plan Note (Signed)
Plan: Low-dose CT for lung cancer screening ordered.

## 2023-06-16 NOTE — Assessment & Plan Note (Signed)
Lipid panel last checked 07/2022 with LDL at goal, 88. OP regimen is rosuvastatin 20 mg daily which she is compliant with. Plan:Lipid panel today. Continue rosuvastatin 20 mg daily.

## 2023-06-16 NOTE — Assessment & Plan Note (Signed)
BP 173/72, on recheck is 150/70. OP regimen is lisinopril 40 mg daily which she is compliant with. She has taken this today. She does not check her BP at home. She does not wish to take an additional blood pressure medication. Plan:BMP today. Continue lisinopril 40 mg daily. Will start amlodipine 10 mg daily, and if she decides she is open to taking another medication she knows it is available for pick up. F/u in 4 weeks.

## 2023-06-16 NOTE — Patient Instructions (Addendum)
It was a pleasure to care for you!  I would like you to change how you take tresiba. Please take 25 units twice daily.  I am also adding a new blood pressure medicine called amlodipine. You will take this once daily in addition to lisinopril.  I will let you know what your lab results show as they come in.  Please return in 4 weeks for check on how you are doing with the changes to your blood pressure and diabetes medicines.  My best, Dr. August Saucer

## 2023-06-16 NOTE — Progress Notes (Signed)
   CC: routine f/u  HPI:  [name] is a 62 y.o. female with past medical history as detailed below who presents for routine f/u. Please see problem based charting for detailed assessment and plan.  Past Medical History:  Diagnosis Date   Diabetes mellitus    DVT of lower limb, acute (HCC) Age 39   Went on blood thinner for a few months   Hyperlipidemia    Left breast lump 06/01/2012   Left breast lump palpated at 3:30 o'clock 5 cm from the nipple. Patient [facility] for Diagnostic Mammogram and possible left breast ultrasound. Appointment scheduled for Wednesday, June 09, 2012 at 0900.         Pancreatitis    Vertigo    Review of Systems:  Negative unless otherwise stated.  Physical Exam:  Vitals:   06/16/23 0950 06/16/23 0958 06/16/23 1018  BP: (!) 173/72 (!) 150/70 (!) 159/60  Pulse: 80 78 77  Temp: 98.1 F (36.7 C)    TempSrc: Oral    SpO2: 100%    Weight: 200 lb 1.6 oz (90.8 kg)    Height: 5\' 6"  (1.676 m)     Cardio:Regular rate and rhythm. No murmurs, rubs, or gallops. Pulm:Clear to auscultation bilaterally. Normal work of breathing on room air. Abdomen:Soft, nontender, nondistended. EAV:[id number] for extremity edema. Skin:Warm and dry. Neuro:Alert and oriented x3. No focal deficit noted. Psych:Pleasant mood and affect.  Assessment & Plan:   See Encounters Tab for problem based charting.  Uncontrolled type 2 diabetes mellitus with hyperglycemia (HCC) HbA1c 13.0% 12/2022. She had been without her medications for several months due to financial constraints and lack of insurance coverage. Today, is improved to 10.6%. OP regimen is jardiance 25 mg daily, tresiba 40 units daily which she has been compliant with. She does check blood sugar at home as she has a Dexcom:  30-day summary: Average glucose 249 Very high 47% High 48% In range 5% Low 0% Very low 0%  She voices interest in restarting ozempic, however with her  history of recurrent pancreatitis I have explained that this is not a safe option for her. Plan:Urine microalbumin/creatine today. Foot exam completed at today's visit and she is aware that she will need to continue with annual diabetic eye exams and will schedule this. Continue jardiance 25 mg daily, change tresiba to 25 units twice daily. F/u in 4 weeks for further adjustments as indicated.  Essential hypertension BP 173/72, on recheck is 150/70. OP regimen is lisinopril 40 mg daily which she is compliant with. She has taken this today. She does not check her BP at home. She does not wish to take an additional blood pressure medication. Plan:BMP today. Continue lisinopril 40 mg daily. Will start amlodipine 10 mg daily, and if she decides she is open to taking another medication she knows it is available for pick up. F/u in 4 weeks.  Hyperlipidemia Lipid panel last checked 07/2022 with LDL at goal, 88. OP regimen is rosuvastatin 20 mg daily which she is compliant with. Plan:Lipid panel today. Continue rosuvastatin 20 mg daily.  Tobacco use Plan: Low-dose CT for lung cancer screening ordered.  Encounter for screening colonoscopy Overdue for recommended annual screening colonoscopy. Last completed in 2022 in [place]. Patient will contact their office to schedule her overdue colonoscopy.  Patient discussed with Dr.  Lafonda Mosses

## 2023-06-16 NOTE — Assessment & Plan Note (Signed)
>>  ASSESSMENT AND PLAN FOR UNCONTROLLED TYPE 2 DIABETES MELLITUS WITH HYPERGLYCEMIA (HCC) WRITTEN ON 06/16/2023 10:45 AM BY DEAN, EMILY, DO  HbA1c 13.0% 12/2022. She had been without her medications for several months due to financial constraints and lack of insurance coverage. Today, is improved to 10.6%. OP regimen is jardiance  25 mg daily, tresiba  40 units daily which she has been compliant with. She does check blood sugar at home as she has a Dexcom:  30-day summary: Average glucose 249 Very high 47% High 48% In range 5% Low 0% Very low 0%  She voices interest in restarting ozempic , however with her history of recurrent pancreatitis I have explained that this is not a safe option for her. Plan:Urine microalbumin/creatine today. Foot exam completed at today's visit and she is aware that she will need to continue with annual diabetic eye exams and will schedule this. Continue jardiance  25 mg daily, change tresiba  to 25 units twice daily. F/u in 4 weeks for further adjustments as indicated.

## 2023-06-17 LAB — LIPID PANEL
Chol/HDL Ratio: 9.1 {ratio} — ABNORMAL HIGH (ref 0.0–4.4)
Cholesterol, Total: 263 mg/dL — ABNORMAL HIGH (ref 100–199)
HDL: 29 mg/dL — ABNORMAL LOW (ref >39)
LDL Chol Calc (NIH): 107 mg/dL — ABNORMAL HIGH (ref 0–99)
Triglycerides: 721 mg/dL (ref 0–149)
VLDL Cholesterol Cal: 127 mg/dL — ABNORMAL HIGH (ref 5–40)

## 2023-06-17 LAB — BMP8+ANION GAP
Anion Gap: 13 mmol/L (ref 10.0–18.0)
BUN/Creatinine Ratio: 21 (ref 12–28)
BUN: 24 mg/dL (ref 8–27)
CO2: 19 mmol/L — ABNORMAL LOW (ref 20–29)
Calcium: 9.3 mg/dL (ref 8.7–10.3)
Chloride: 104 mmol/L (ref 96–106)
Creatinine, Ser: 1.13 mg/dL — ABNORMAL HIGH (ref 0.57–1.00)
Glucose: 315 mg/dL — ABNORMAL HIGH (ref 70–99)
Potassium: 4.8 mmol/L (ref 3.5–5.2)
Sodium: 136 mmol/L (ref 134–144)
eGFR: 56 mL/min/{1.73_m2} — ABNORMAL LOW (ref >59)

## 2023-06-17 LAB — MICROALBUMIN / CREATININE URINE RATIO
Creatinine, Urine: 30.5 mg/dL
Microalb/Creat Ratio: 1868 mg/g{creat} — ABNORMAL HIGH (ref 0–29)
Microalbumin, Urine: 569.7 ug/mL

## 2023-06-18 ENCOUNTER — Other Ambulatory Visit (HOSPITAL_COMMUNITY): Payer: Self-pay

## 2023-06-18 ENCOUNTER — Telehealth: Payer: Self-pay | Admitting: Internal Medicine

## 2023-06-18 DIAGNOSIS — E1121 Type 2 diabetes mellitus with diabetic nephropathy: Secondary | ICD-10-CM

## 2023-06-18 MED ORDER — INSULIN PEN NEEDLE 32G X 4 MM MISC
Freq: Every day | 99 refills | Status: AC
  Filled 2023-06-18 – 2023-07-09 (×2): qty 100, 90d supply, fill #0
  Filled 2023-07-30: qty 100, 100d supply, fill #0
  Filled 2023-12-11: qty 100, 90d supply, fill #0
  Filled 2023-12-11 – 2024-02-26 (×3): qty 100, 90d supply, fill #1
  Filled 2024-06-01: qty 100, 90d supply, fill #2

## 2023-06-18 NOTE — Progress Notes (Signed)
Internal Medicine Clinic Attending  Case discussed with the resident at the time of the visit.  We reviewed the resident's history and exam and pertinent patient test results.  I agree with the assessment, diagnosis, and plan of care documented in the resident's note.    Severe proteinuria warrants referral to Nephrology if patient is open to seeing them. Will repeat fasting lipid panel if she was not fasting for labs before determining final treatment plan for hyperTG

## 2023-06-18 NOTE — Telephone Encounter (Addendum)
I spoke with Ms. Westgate regarding lab results from her recent OV.  Lipid profile -She was not fasted. I recommended starting omega-3 and increasing rosuvastatin to 40 mg dose. Will have her come in for fasting labs and start a fibrate if triglycerides remain very elevated.  Kidney disease -Metabolic panel with second result in CKD stage 3a range. GFR 56. -Urine microalbumin/creatinine ratio is increased to 1868 from 1178.  -The combination of these results warrants a nephrology referral as she does not currently follow with them. -Recommended therapies:  -Does not tolerate metformin  -On jardiance at maximal dose (25 mg daily)  -On lisinopril at maximal dose (40 mg daily)   -May start nonsteroidal MRA vs diuretic for additional BP control  -Unable to take GLP-1 RA due to recurrent pancreatitis  -Unable to tolerate amlodipine due to GI side effects  -On high intensity statin   -May need to start zetia, fibrate pending repeat lipid profile while fasting  HTN -Took one dose of amlodipine and was sick to her stomach, no longer willing to take this  She also needs a refill on her pen needles.  I will reach out to the front desk to schedule a visit for first appointment if able for fasting lipid panel and to discuss her options for treating HTN. I will also refer her to nephrology at this time. Pen needles refilled.  Champ Mungo, DO

## 2023-06-19 ENCOUNTER — Other Ambulatory Visit: Payer: Self-pay | Admitting: Internal Medicine

## 2023-06-19 ENCOUNTER — Encounter: Payer: Self-pay | Admitting: Student

## 2023-06-19 DIAGNOSIS — E785 Hyperlipidemia, unspecified: Secondary | ICD-10-CM

## 2023-06-19 NOTE — Progress Notes (Signed)
Patient to come in Monday morning at 930 AM for fasting lipid panel.

## 2023-06-22 ENCOUNTER — Other Ambulatory Visit: Payer: Medicaid Other

## 2023-06-26 ENCOUNTER — Other Ambulatory Visit (HOSPITAL_COMMUNITY): Payer: Self-pay

## 2023-06-30 ENCOUNTER — Other Ambulatory Visit (HOSPITAL_COMMUNITY): Payer: Self-pay

## 2023-07-06 ENCOUNTER — Ambulatory Visit: Payer: Medicaid Other

## 2023-07-09 ENCOUNTER — Other Ambulatory Visit (HOSPITAL_COMMUNITY): Payer: Self-pay

## 2023-07-16 ENCOUNTER — Other Ambulatory Visit (HOSPITAL_COMMUNITY): Payer: Self-pay

## 2023-07-17 ENCOUNTER — Other Ambulatory Visit (HOSPITAL_COMMUNITY): Payer: Self-pay

## 2023-07-21 ENCOUNTER — Other Ambulatory Visit (HOSPITAL_COMMUNITY): Payer: Self-pay

## 2023-07-23 ENCOUNTER — Ambulatory Visit: Payer: Medicaid Other

## 2023-07-24 ENCOUNTER — Ambulatory Visit (HOSPITAL_COMMUNITY): Payer: Medicaid Other

## 2023-07-29 ENCOUNTER — Other Ambulatory Visit (HOSPITAL_COMMUNITY): Payer: Self-pay

## 2023-07-30 ENCOUNTER — Other Ambulatory Visit (HOSPITAL_COMMUNITY): Payer: Self-pay

## 2023-08-10 ENCOUNTER — Other Ambulatory Visit (HOSPITAL_COMMUNITY): Payer: Self-pay

## 2023-08-17 ENCOUNTER — Other Ambulatory Visit (HOSPITAL_COMMUNITY): Payer: Self-pay

## 2023-08-20 ENCOUNTER — Ambulatory Visit (HOSPITAL_COMMUNITY): Payer: Medicaid Other

## 2023-08-25 ENCOUNTER — Ambulatory Visit: Payer: Medicaid Other

## 2023-08-27 ENCOUNTER — Ambulatory Visit (HOSPITAL_COMMUNITY): Payer: Medicaid Other

## 2023-09-08 ENCOUNTER — Ambulatory Visit (HOSPITAL_COMMUNITY): Payer: Medicaid Other

## 2023-09-21 ENCOUNTER — Telehealth: Payer: Self-pay | Admitting: Student

## 2023-09-21 NOTE — Telephone Encounter (Signed)
 Copied from CRM [id number]. Topic: General - Other >> Sep 21, 2023  9:47 AM Hamdi H wrote: Reason for CRM: Patients prior authorization expired on March 20th. Patient will need a new one filled out before her appointment on Wednesday March 26th.  Pt has been authorized for a third time and has verbalized understanding to keep her sch appt on 09/23/2023.

## 2023-09-22 ENCOUNTER — Ambulatory Visit
Admission: RE | Admit: 2023-09-22 | Discharge: 2023-09-22 | Disposition: A | Discharge status: Home / Self Care | Payer: Medicaid Other | Source: Ambulatory Visit | Attending: Internal Medicine

## 2023-09-22 DIAGNOSIS — Z0000 Encounter for general adult medical examination without abnormal findings: Secondary | ICD-10-CM

## 2023-09-22 DIAGNOSIS — Z1231 Encounter for screening mammogram for malignant neoplasm of breast: Secondary | ICD-10-CM | POA: Diagnosis not present

## 2023-09-23 ENCOUNTER — Other Ambulatory Visit (HOSPITAL_COMMUNITY): Payer: Self-pay

## 2023-09-23 ENCOUNTER — Other Ambulatory Visit: Payer: Self-pay | Admitting: Internal Medicine

## 2023-09-23 ENCOUNTER — Ambulatory Visit (HOSPITAL_COMMUNITY)
Admission: RE | Admit: 2023-09-23 | Discharge: 2023-09-23 | Disposition: A | Discharge status: Home / Self Care | Source: Ambulatory Visit | Attending: Internal Medicine | Admitting: Internal Medicine

## 2023-09-23 DIAGNOSIS — Z720 Tobacco use: Secondary | ICD-10-CM | POA: Insufficient documentation

## 2023-09-23 DIAGNOSIS — Z87891 Personal history of nicotine dependence: Secondary | ICD-10-CM | POA: Diagnosis not present

## 2023-09-23 DIAGNOSIS — E1165 Type 2 diabetes mellitus with hyperglycemia: Secondary | ICD-10-CM

## 2023-09-23 MED ORDER — TRESIBA FLEXTOUCH 100 UNIT/ML ~~LOC~~ SOPN
25.0000 [IU] | PEN_INJECTOR | Freq: Two times a day (BID) | SUBCUTANEOUS | 1 refills | Status: DC
  Filled 2023-09-23: qty 15, 30d supply, fill #0
  Filled 2023-11-24: qty 15, 30d supply, fill #1

## 2023-09-25 ENCOUNTER — Emergency Department (HOSPITAL_BASED_OUTPATIENT_CLINIC_OR_DEPARTMENT_OTHER)
Admission: EM | Admit: 2023-09-25 | Discharge: 2023-09-25 | Disposition: A | Discharge status: Home / Self Care | Attending: Emergency Medicine | Admitting: Emergency Medicine

## 2023-09-25 ENCOUNTER — Other Ambulatory Visit: Payer: Self-pay

## 2023-09-25 ENCOUNTER — Other Ambulatory Visit (HOSPITAL_COMMUNITY): Payer: Self-pay

## 2023-09-25 ENCOUNTER — Encounter (HOSPITAL_BASED_OUTPATIENT_CLINIC_OR_DEPARTMENT_OTHER): Payer: Self-pay | Admitting: *Deleted

## 2023-09-25 ENCOUNTER — Emergency Department (HOSPITAL_BASED_OUTPATIENT_CLINIC_OR_DEPARTMENT_OTHER)

## 2023-09-25 DIAGNOSIS — R1013 Epigastric pain: Secondary | ICD-10-CM | POA: Diagnosis not present

## 2023-09-25 DIAGNOSIS — K859 Acute pancreatitis without necrosis or infection, unspecified: Secondary | ICD-10-CM | POA: Diagnosis not present

## 2023-09-25 DIAGNOSIS — R112 Nausea with vomiting, unspecified: Secondary | ICD-10-CM | POA: Insufficient documentation

## 2023-09-25 DIAGNOSIS — K5730 Diverticulosis of large intestine without perforation or abscess without bleeding: Secondary | ICD-10-CM | POA: Diagnosis not present

## 2023-09-25 DIAGNOSIS — Z7984 Long term (current) use of oral hypoglycemic drugs: Secondary | ICD-10-CM | POA: Diagnosis not present

## 2023-09-25 DIAGNOSIS — Z7982 Long term (current) use of aspirin: Secondary | ICD-10-CM | POA: Insufficient documentation

## 2023-09-25 DIAGNOSIS — Z794 Long term (current) use of insulin: Secondary | ICD-10-CM | POA: Diagnosis not present

## 2023-09-25 DIAGNOSIS — R109 Unspecified abdominal pain: Secondary | ICD-10-CM | POA: Diagnosis not present

## 2023-09-25 DIAGNOSIS — E119 Type 2 diabetes mellitus without complications: Secondary | ICD-10-CM | POA: Diagnosis not present

## 2023-09-25 DIAGNOSIS — R748 Abnormal levels of other serum enzymes: Secondary | ICD-10-CM | POA: Diagnosis not present

## 2023-09-25 DIAGNOSIS — R1011 Right upper quadrant pain: Secondary | ICD-10-CM | POA: Diagnosis not present

## 2023-09-25 LAB — URINALYSIS, MICROSCOPIC (REFLEX)

## 2023-09-25 LAB — COMPREHENSIVE METABOLIC PANEL WITH GFR
ALT: 39 U/L (ref 0–44)
AST: 17 U/L (ref 15–41)
Albumin: 3.5 g/dL (ref 3.5–5.0)
Alkaline Phosphatase: 126 U/L (ref 38–126)
Anion gap: 9 (ref 5–15)
BUN: 33 mg/dL — ABNORMAL HIGH (ref 6–20)
CO2: 22 mmol/L (ref 22–32)
Calcium: 9.1 mg/dL (ref 8.9–10.3)
Chloride: 104 mmol/L (ref 98–111)
Creatinine, Ser: 1.26 mg/dL — ABNORMAL HIGH (ref 0.44–1.00)
GFR, Estimated: 49 mL/min — ABNORMAL LOW (ref >60)
Glucose, Bld: 218 mg/dL — ABNORMAL HIGH (ref 70–99)
Potassium: 4.4 mmol/L (ref 3.5–5.1)
Sodium: 135 mmol/L (ref 135–145)
Total Bilirubin: 0.6 mg/dL (ref 0.0–1.2)
Total Protein: 7.3 g/dL (ref 6.5–8.1)

## 2023-09-25 LAB — URINALYSIS, ROUTINE W REFLEX MICROSCOPIC
Bilirubin Urine: NEGATIVE
Glucose, UA: >=500 mg/dL — AB
Ketones, ur: NEGATIVE mg/dL
Leukocytes,Ua: NEGATIVE
Nitrite: NEGATIVE
Protein, ur: >=300 mg/dL — AB
Specific Gravity, Urine: 1.015 (ref 1.005–1.030)
pH: 5.5 (ref 5.0–8.0)

## 2023-09-25 LAB — CBC
HCT: 43.2 % (ref 36.0–46.0)
Hemoglobin: 13.6 g/dL (ref 12.0–15.0)
MCH: 24.7 pg — ABNORMAL LOW (ref 26.0–34.0)
MCHC: 31.5 g/dL (ref 30.0–36.0)
MCV: 78.4 fL — ABNORMAL LOW (ref 80.0–100.0)
Platelets: 238 10*3/uL (ref 150–400)
RBC: 5.51 MIL/uL — ABNORMAL HIGH (ref 3.87–5.11)
RDW: 14.6 % (ref 11.5–15.5)
WBC: 9.7 10*3/uL (ref 4.0–10.5)
nRBC: 0 % (ref 0.0–0.2)

## 2023-09-25 LAB — LIPASE, BLOOD: Lipase: 838 U/L — ABNORMAL HIGH (ref 11–51)

## 2023-09-25 MED ORDER — OXYCODONE-ACETAMINOPHEN 5-325 MG PO TABS
1.0000 | ORAL_TABLET | Freq: Four times a day (QID) | ORAL | 0 refills | Status: AC | PRN
  Filled 2023-09-25: qty 20, 5d supply, fill #0

## 2023-09-25 MED ORDER — ONDANSETRON HCL 4 MG/2ML IJ SOLN
4.0000 mg | Freq: Once | INTRAMUSCULAR | Status: AC
  Administered 2023-09-25: 4 mg via INTRAVENOUS
  Filled 2023-09-25: qty 2

## 2023-09-25 MED ORDER — IOHEXOL 300 MG/ML  SOLN
100.0000 mL | Freq: Once | INTRAMUSCULAR | Status: AC | PRN
  Administered 2023-09-25: 100 mL via INTRAVENOUS

## 2023-09-25 MED ORDER — ONDANSETRON 4 MG PO TBDP
4.0000 mg | ORAL_TABLET | Freq: Three times a day (TID) | ORAL | 0 refills | Status: AC | PRN
  Filled 2023-09-25: qty 20, 7d supply, fill #0

## 2023-09-25 MED ORDER — SODIUM CHLORIDE 0.9 % IV SOLN: INTRAVENOUS | Status: DC

## 2023-09-25 MED ORDER — MORPHINE SULFATE (PF) 2 MG/ML IV SOLN
2.0000 mg | Freq: Once | INTRAVENOUS | Status: AC
  Administered 2023-09-25: 2 mg via INTRAVENOUS
  Filled 2023-09-25: qty 1

## 2023-09-25 NOTE — Discharge Instructions (Addendum)
 Today you are seen for epigastric pain.  Please pick up your medication and take as prescribed.  Please try to stick to a liquid diet until your symptoms resolve.  Thank you for letting us treat you today. After reviewing your labs and imaging, I feel you are safe to go home. Please follow up with your PCP in the next several days and provide them with your records from this visit. Return to the Emergency Room if pain becomes severe or symptoms worsen.

## 2023-09-25 NOTE — ED Notes (Signed)
 Reviewed discharge instructions,follow up and medications reviewed. Pts questions answered by provider. Pain free at time of discharge. Pts daughter is transporting home

## 2023-09-25 NOTE — ED Triage Notes (Signed)
 Pt is here for abdominal pain x5 days.  Pt has hof pancreatitis and this feels the same.  No fever or chills.  Pt has had nausea.  LBM last pm

## 2023-09-25 NOTE — ED Provider Notes (Signed)
 [place] [facility] AT [facility] Provider Note   CSN: [id number] Arrival date & time: 09/25/23  0848     History  Chief Complaint  Patient presents with   Abdominal Pain    Kristen Garrett is a 63 y.o. female past medical history significant for pancreatitis and diabetes presents today for epigastric abdominal pain x 5 days.  Patient states that this feels similar to previous episodes of pancreatitis.  Patient denies fever, chills, diarrhea, shortness of breath, or chest pain.  Patient endorses nausea and 1 episode of emesis last night.  Patient states her last bowel movement was last night.   Abdominal Pain Associated symptoms: nausea and vomiting        Home Medications Prior to Admission medications   Medication Sig Start Date End Date Taking? Authorizing Provider  ondansetron (ZOFRAN-ODT) 4 MG disintegrating tablet Take 1 tablet (4 mg total) by mouth every 8 (eight) hours as needed for nausea or vomiting. 09/25/23  Yes Dolphus Jenny, PA-C  oxyCODONE-acetaminophen (PERCOCET/ROXICET) 5-325 MG tablet Take 1 tablet by mouth every 6 (six) hours as needed for up to 5 days for severe pain (pain score 7-10). 09/25/23 09/30/23 Yes Dolphus Jenny, PA-C  Accu-Chek Softclix Lancets lancets Test up to 4 times daily as directed 12/30/22   Katheran James, DO  amLODipine (NORVASC) 10 MG tablet Take 1 tablet (10 mg total) by mouth daily. 06/16/23 06/15/24  Champ Mungo, DO  aspirin 81 MG chewable tablet Chew 1 tablet (81 mg total) by mouth daily. 04/05/20   [place], Carley D, DO  blood glucose meter kit and supplies KIT Use up to four times daily as directed. 12/30/22   Katheran James, DO  Blood Glucose Monitoring Suppl (RELION ULTIMA GLUCOSE SYSTEM) w/Device KIT 1 Device by Does not apply route daily. 08/02/15   Moses Manners, MD  Continuous Glucose Sensor (DEXCOM G7 SENSOR) MISC Place new sensor every 10 days. Use to monitor blood sugar continuously. 02/04/23   Champ Mungo, DO  empagliflozin (JARDIANCE) 25 MG TABS tablet Take 1 tablet (25 mg total) by mouth daily before breakfast. 06/16/23   Champ Mungo, DO  gabapentin (NEURONTIN) 300 MG capsule Take 2 capsules (600 mg total) by mouth 3 (three) times daily. 06/16/23   Champ Mungo, DO  glucose blood (RELION GLUCOSE TEST STRIPS) test strip Use as instructed 08/02/15   Moses Manners, MD  glucose blood test strip Test as directed up to 4 (four) times daily. 12/30/22   Katheran James, DO  insulin degludec (TRESIBA FLEXTOUCH) 100 UNIT/ML FlexTouch Pen Inject 25 Units into the skin 2 (two) times daily. 09/23/23   Nooruddin, Jason Fila, MD  Insulin Pen Needle 32G X 4 MM MISC Use to inject insulin one time daily. 06/18/23   Champ Mungo, DO  Lancets Misc. (RELION LANCING DEVICE) KIT 1 Device by Does not apply route daily. 08/02/15   Moses Manners, MD  lisinopril (ZESTRIL) 40 MG tablet Take 1 tablet (40 mg total) by mouth daily. 06/16/23 10/23/23  Champ Mungo, DO  Multiple Vitamin (MULTIVITAMIN WITH MINERALS) TABS tablet Take 1 tablet by mouth daily.    [provider]  rosuvastatin (CRESTOR) 20 MG tablet Take 1 tablet (20 mg total) by mouth daily. 06/16/23 10/23/23  Champ Mungo, DO      Allergies    Glipizide and Metformin and related    Review of Systems   Review of Systems  Gastrointestinal:  Positive for abdominal pain, nausea and vomiting.  Physical Exam Updated Vital Signs BP (!) 142/76   Pulse 73   Temp 98.8 F (37.1 C)   Resp 14   LMP 02/17/2015   SpO2 98%  Physical Exam Vitals and nursing note reviewed.  Constitutional:      General: She is not in acute distress.    Appearance: She is well-developed. She is not ill-appearing, toxic-appearing or diaphoretic.     Comments: Uncomfortable appearing  HENT:     Head: Normocephalic and atraumatic.     Mouth/Throat:     Mouth: Mucous membranes are moist.  Eyes:     Extraocular Movements: Extraocular movements intact.      Conjunctiva/sclera: Conjunctivae normal.  Cardiovascular:     Rate and Rhythm: Normal rate and regular rhythm.     Heart sounds: Normal heart sounds. No murmur heard. Pulmonary:     Effort: Pulmonary effort is normal. No respiratory distress.     Breath sounds: Normal breath sounds.  Abdominal:     General: Abdomen is protuberant. Bowel sounds are normal.     Palpations: Abdomen is soft.     Tenderness: There is abdominal tenderness in the right upper quadrant and epigastric area.  Musculoskeletal:        General: No swelling.     Cervical back: Neck supple.  Skin:    General: Skin is warm and dry.     Capillary Refill: Capillary refill takes less than 2 seconds.  Neurological:     General: No focal deficit present.     Mental Status: She is alert and oriented to person, place, and time.  Psychiatric:        Mood and Affect: Mood normal.     ED Results / Procedures / Treatments   Labs (all labs ordered are listed, but only abnormal results are displayed) Labs Reviewed  LIPASE, BLOOD - Abnormal; Notable for the following components:      Result Value   Lipase 838 (*)    All other components within normal limits  COMPREHENSIVE METABOLIC PANEL WITH GFR - Abnormal; Notable for the following components:   Glucose, Bld 218 (*)    BUN 33 (*)    Creatinine, Ser 1.26 (*)    GFR, Estimated 49 (*)    All other components within normal limits  CBC - Abnormal; Notable for the following components:   RBC 5.51 (*)    MCV 78.4 (*)    MCH 24.7 (*)    All other components within normal limits  URINALYSIS, ROUTINE W REFLEX MICROSCOPIC - Abnormal; Notable for the following components:   Glucose, UA >=500 (*)    Hgb urine dipstick SMALL (*)    Protein, ur >=300 (*)    All other components within normal limits  URINALYSIS, MICROSCOPIC (REFLEX) - Abnormal; Notable for the following components:   Bacteria, UA RARE (*)    All other components within normal limits    EKG EKG  Interpretation Date/Time:  Friday September 25 2023 09:38:59 EDT Ventricular Rate:  81 PR Interval:  148 QRS Duration:  96 QT Interval:  372 QTC Calculation: 432 R Axis:   30  Text Interpretation: Sinus rhythm Confirmed by Edwin Dada ([id number]) on 09/25/2023 9:46:18 AM  Radiology CT ABDOMEN PELVIS W CONTRAST Result Date: 09/25/2023 CLINICAL DATA:  Acute pancreatitis, abdominal pain EXAM: CT ABDOMEN AND PELVIS WITH CONTRAST TECHNIQUE: Multidetector CT imaging of the abdomen and pelvis was performed using the standard protocol following bolus administration of intravenous contrast. RADIATION DOSE REDUCTION: This  exam was performed according to the departmental dose-optimization program which includes automated exposure control, adjustment of the mA and/or kV according to patient size and/or use of iterative reconstruction technique. CONTRAST:  OMNIPAQUE IOHEXOL 300 MG/ML  SOLN COMPARISON:  08/04/2020 FINDINGS: Lower chest: No pleural or pericardial effusion. Mild bronchovascular crowding at the lung bases. Hepatobiliary: No focal liver abnormality is seen. No gallstones, gallbladder wall thickening, or biliary dilatation. Pancreas: Unremarkable. No pancreatic ductal dilatation or surrounding inflammatory changes. Spleen: Normal in size without focal abnormality. Adrenals/Urinary Tract: Adrenal glands are unremarkable. Kidneys are normal, without renal calculi, focal lesion, or hydronephrosis. Bladder is unremarkable. Stomach/Bowel: Stomach is incompletely distended, without acute finding. Small bowel decompressed. Normal appendix. The colon is partially distended, with a few scattered proximal sigmoid diverticula; no adjacent inflammatory change. Vascular/Lymphatic: Moderate calcified aortoiliac plaque without aneurysm. Portal vein patent. No abdominal or pelvic adenopathy. Reproductive: Status post hysterectomy. No adnexal masses. Other: Pelvic phleboliths.  No ascites.  No free air. Musculoskeletal: Small  paraumbilical hernia containing only mesenteric fat. Degenerative disc disease L3-L5. Bilateral hip DJD. IMPRESSION: 1. No acute findings. 2. Sigmoid diverticulosis. Electronically Signed   By: Corlis Leak M.D.   On: 09/25/2023 12:41    Procedures Procedures    Medications Ordered in ED Medications  0.9 %  sodium chloride infusion ( Intravenous New Bag/Given 09/25/23 1204)  morphine (PF) 2 MG/ML injection 2 mg (2 mg Intravenous Given 09/25/23 1019)  ondansetron (ZOFRAN) injection 4 mg (4 mg Intravenous Given 09/25/23 1019)  iohexol (OMNIPAQUE) 300 MG/ML solution 100 mL (100 mLs Intravenous Contrast Given 09/25/23 1108)    ED Course/ Medical Decision Making/ A&P                                 Medical Decision Making Amount and/or Complexity of Data Reviewed Labs: ordered. Radiology: ordered.  Risk Prescription drug management.   This patient presents to the ED with chief complaint(s) of abdominal pain with pertinent past medical history of pancreatitis and diabetes which further complicates the presenting complaint. The complaint involves an extensive differential diagnosis and also carries with it a high risk of complications and morbidity.    The differential diagnosis includes pancreatitis, choledocholithiasis, SBO, acute cholecystitis, volvulus, diverticulitis, ulcerative colitis, GI illness  Additional history obtained: Records reviewed Primary Care Documents  ED Course and Reassessment: Patient given morphine and Zofran for pain and nausea. Patient started on continuous fluids  Independent labs interpretation:  The following labs were independently interpreted:  CBC: No notable findings CMP: Mildly elevated creatinine at 1.26, elevated bun at 33 Lipase: 838 UA: Greater than 500 glucose, small hemoglobin, greater than 300 protein EKG: Sinus rhythm  Independent visualization of imaging: - I independently visualized the following imaging with scope of interpretation  limited to determining acute life threatening conditions related to emergency care: CT abdomen pelvis with contrast, which revealed no acute findings, sigmoid diverticulosis  Consultation: - Consulted or discussed management/test interpretation w/ external professional: None  Consideration for admission or further workup: Considered for admission and further workup however patient's vital signs, physical exam, labs, and imaging were reassuring.  Given the patient's lipase is elevated, she has tenderness in the epigastric region, and history of pancreatitis I will treat the patient outpatient as such with pain reliever and antiemetics.  Patient advised to try to stick to a liquid diet as tolerated until her symptoms have resolved.  Patient should follow-up with her  primary care if her symptoms persist for further evaluation and treatment.        Final Clinical Impression(s) / ED Diagnoses Final diagnoses:  Elevated lipase  Epigastric pain    Rx / DC Orders ED Discharge Orders          Ordered    ondansetron (ZOFRAN-ODT) 4 MG disintegrating tablet  Every 8 hours PRN        09/25/23 1308    oxyCODONE-acetaminophen (PERCOCET/ROXICET) 5-325 MG tablet  Every 6 hours PRN        09/25/23 1308              Dolphus Jenny, PA-C 09/25/23 1311    Edwin Dada P, DO 10/07/23 2358

## 2023-09-25 NOTE — ED Notes (Signed)
 In to check on the Pt.   Pt. Resting with eyes closed.  Spoke with Pt..... She asked for ice chips.  Explained to Pt. That we will have to wait on test results.

## 2023-10-05 ENCOUNTER — Other Ambulatory Visit (HOSPITAL_COMMUNITY): Payer: Self-pay

## 2023-10-13 ENCOUNTER — Ambulatory Visit: Payer: Self-pay | Admitting: Student

## 2023-10-13 ENCOUNTER — Telehealth: Payer: Self-pay | Admitting: *Deleted

## 2023-10-13 NOTE — Progress Notes (Deleted)
   Established Patient Office Visit  Subjective   Patient ID: Kristen Garrett, female    DOB: 01/03/1961  Age: 63 y.o. MRN: [id number]  No chief complaint on file.   Kristen Garrett is a 63 y.o. who presents to the clinic for ***. Please see problem based assessment and plan for additional details.  ED follow up: 3/28: epigastric abdominal pain for 5 days, N/V with a lipase of 838, CT normal : treated as patient panc: repeat CMP today.... -lisinopril can cause panc, is on it... switch to an ARB   Patient presents with a history of T2DM with a prior A1c of 10.6 in December 2024.  Patient's A1c today is ***.  They are on a regimen of Jardiance 25 mg, Tresiba 25 units BID.  Patient *** hypoglycemia.  Per review of CGM, average blood glucose is ***.  Average fasting blood glucose is ***. 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  Last metabolic panel Lab Results  Component Value Date    GLUCOSE 218 (H) 09/25/2023   NA 135 09/25/2023   K 4.4 09/25/2023   CL 104 09/25/2023   CO2 22 09/25/2023   BUN 33 (H) 09/25/2023   CREATININE 1.26 (H) 09/25/2023   GFRNONAA 49 (L) 09/25/2023   CALCIUM 9.1 09/25/2023   PHOS 4.1 03/08/2019   PROT 7.3 09/25/2023   ALBUMIN 3.5 09/25/2023   LABGLOB 2.8 02/13/2022   AGRATIO 1.4 02/13/2022   BILITOT 0.6 09/25/2023   ALKPHOS 126 09/25/2023   AST 17 09/25/2023   ALT 39 09/25/2023   ANIONGAP 9 09/25/2023   Last lipids Lab Results  Component Value Date   CHOL 263 (H) 06/16/2023   HDL 29 (L) 06/16/2023   LDLCALC 107 (H) 06/16/2023   TRIG 721 (HH) 06/16/2023   CHOLHDL 9.1 (H) 06/16/2023   Last hemoglobin A1c Lab Results  Component Value Date   HGBA1C 10.6 (A) 06/16/2023      The 10-year ASCVD risk score (Arnett DK, et al., 2019) is: 54.6%    Assessment & Plan:   Problem List Items Addressed This Visit   None   No follow-ups on file.    Aurora Lees, DO

## 2023-10-13 NOTE — Telephone Encounter (Signed)
 Copied from CRM [id number]. Topic: Appointments - Appointment Cancel/Reschedule >> Oct 13, 2023  9:11 AM Retta Caster wrote: Patient/patient representative is calling to cancel or reschedule an appointment. Refer to attachments for appointment information.

## 2023-10-14 ENCOUNTER — Telehealth: Payer: Self-pay

## 2023-10-14 ENCOUNTER — Other Ambulatory Visit (HOSPITAL_COMMUNITY): Payer: Self-pay

## 2023-10-14 ENCOUNTER — Encounter: Payer: Self-pay | Admitting: Internal Medicine

## 2023-10-14 NOTE — Telephone Encounter (Signed)
 Prior Authorization for patient (Dexcom G7 Sensor) came through on cover my meds was submitted with last office notes and labs awaiting approval or denial.  ZOX:[id number]

## 2023-10-20 ENCOUNTER — Other Ambulatory Visit (HOSPITAL_COMMUNITY): Payer: Self-pay

## 2023-10-20 NOTE — Telephone Encounter (Signed)
 Pharmacy Patient Advocate Encounter  Received notification from [facility] that Prior Authorization for [facility] G7 SENSORS has been APPROVED from 10/20/23 to 10/19/24   PA #/Case ID/Reference #: [id number]

## 2023-10-20 NOTE — Telephone Encounter (Signed)
 Resubmitted PA. Key [id number]

## 2023-10-21 ENCOUNTER — Other Ambulatory Visit (HOSPITAL_COMMUNITY): Payer: Self-pay

## 2023-11-06 ENCOUNTER — Other Ambulatory Visit: Payer: Self-pay | Admitting: Internal Medicine

## 2023-11-06 ENCOUNTER — Other Ambulatory Visit (HOSPITAL_COMMUNITY): Payer: Self-pay

## 2023-11-06 DIAGNOSIS — I10 Essential (primary) hypertension: Secondary | ICD-10-CM

## 2023-11-06 MED ORDER — LISINOPRIL 40 MG PO TABS
40.0000 mg | ORAL_TABLET | Freq: Every day | ORAL | 2 refills | Status: AC
  Filled 2023-11-06 (×2): qty 30, 30d supply, fill #0

## 2023-11-06 NOTE — Telephone Encounter (Signed)
 LOV 05/2023 w/4 weeks f/u.  I called pt to schedule an appt ; she's agreeable - call transferred to front office. Appt schedule w/Dr Teodora Fell on 5/14.

## 2023-11-10 ENCOUNTER — Other Ambulatory Visit (HOSPITAL_COMMUNITY): Payer: Self-pay

## 2023-11-10 MED ORDER — LISINOPRIL 40 MG PO TABS
40.0000 mg | ORAL_TABLET | Freq: Every day | ORAL | 3 refills | Status: AC
  Filled 2023-11-10 – 2023-12-11 (×4): qty 90, 90d supply, fill #0
  Filled 2024-03-28: qty 90, 90d supply, fill #1
  Filled 2024-06-27: qty 90, 90d supply, fill #2

## 2023-11-11 ENCOUNTER — Other Ambulatory Visit: Payer: Self-pay | Admitting: Nephrology

## 2023-11-11 ENCOUNTER — Encounter: Admitting: Student

## 2023-11-11 DIAGNOSIS — N1831 Chronic kidney disease, stage 3a: Secondary | ICD-10-CM

## 2023-11-13 ENCOUNTER — Other Ambulatory Visit (HOSPITAL_COMMUNITY): Payer: Self-pay

## 2023-11-19 ENCOUNTER — Ambulatory Visit: Admission: RE | Admit: 2023-11-19 | Source: Ambulatory Visit

## 2023-11-19 ENCOUNTER — Ambulatory Visit (INDEPENDENT_AMBULATORY_CARE_PROVIDER_SITE_OTHER): Admitting: Student

## 2023-11-19 VITALS — BP 148/90 | HR 78 | Temp 98.3°F | Wt 197.0 lb

## 2023-11-19 DIAGNOSIS — Z7984 Long term (current) use of oral hypoglycemic drugs: Secondary | ICD-10-CM | POA: Diagnosis not present

## 2023-11-19 DIAGNOSIS — I10 Essential (primary) hypertension: Secondary | ICD-10-CM

## 2023-11-19 DIAGNOSIS — Z794 Long term (current) use of insulin: Secondary | ICD-10-CM

## 2023-11-19 DIAGNOSIS — E1165 Type 2 diabetes mellitus with hyperglycemia: Secondary | ICD-10-CM | POA: Diagnosis not present

## 2023-11-19 DIAGNOSIS — E785 Hyperlipidemia, unspecified: Secondary | ICD-10-CM

## 2023-11-19 DIAGNOSIS — E1121 Type 2 diabetes mellitus with diabetic nephropathy: Secondary | ICD-10-CM | POA: Diagnosis not present

## 2023-11-19 LAB — POCT GLYCOSYLATED HEMOGLOBIN (HGB A1C): Hemoglobin A1C: 11.1 % — AB (ref 4.0–5.6)

## 2023-11-19 LAB — GLUCOSE, CAPILLARY: Glucose-Capillary: 234 mg/dL — ABNORMAL HIGH (ref 70–99)

## 2023-11-19 NOTE — Progress Notes (Signed)
 CC: FU on blood pressure  HPI:  Kristen Garrett is a 63 y.o. female living with a history stated below and presents today for FU on blood pressure.  Patient was last seen resident [facility] on 05/2023.  Since last office visit, she was seen in the ED on 03/28 for acute pancreatitis.  She was discharged with pain meds, and Zofran .  Please see problem based assessment and plan for additional details. Past Medical History:  Diagnosis Date   Diabetes mellitus    DVT of lower limb, acute (HCC) Age 52   Went on blood thinner for a few months   Hyperlipidemia    Left breast lump 06/01/2012   Left breast lump palpated at 3:30 o'clock 5 cm from the nipple. Patient referred to [facility] for Diagnostic Mammogram and possible left breast ultrasound. Appointment scheduled for Wednesday, June 09, 2012 at 0900.         Pancreatitis    Vertigo     Current Outpatient Medications on File Prior to Visit  Medication Sig Dispense Refill   Accu-Chek Softclix Lancets lancets Test up to 4 times daily as directed 100 each 0   amLODipine  (NORVASC ) 10 MG tablet Take 1 tablet (10 mg total) by mouth daily. 30 tablet 11   aspirin  81 MG chewable tablet Chew 1 tablet (81 mg total) by mouth daily. 90 tablet 2   blood glucose meter kit and supplies KIT Use up to four times daily as directed. 1 each 0   Blood Glucose Monitoring Suppl (RELION ULTIMA GLUCOSE SYSTEM) w/Device KIT 1 Device by Does not apply route daily. 1 kit 0   Continuous Glucose Sensor (DEXCOM G7 SENSOR) MISC Place new sensor every 10 days. Use to monitor blood sugar continuously. 9 each 3   empagliflozin  (JARDIANCE ) 25 MG TABS tablet Take 1 tablet (25 mg total) by mouth daily before breakfast. 90 tablet 2   gabapentin  (NEURONTIN ) 300 MG capsule Take 2 capsules (600 mg total) by mouth 3 (three) times daily. 180 capsule 3   glucose blood (RELION GLUCOSE TEST STRIPS) test strip Use as instructed 100 each 12   glucose blood test  strip Test as directed up to 4 (four) times daily. 100 each 0   insulin  degludec (TRESIBA  FLEXTOUCH) 100 UNIT/ML FlexTouch Pen Inject 25 Units into the skin 2 (two) times daily. 15 mL 1   Insulin  Pen Needle 32G X 4 MM MISC Use to inject insulin  one time daily. 100 each PRN   Lancets Misc. (RELION LANCING DEVICE) KIT 1 Device by Does not apply route daily. 1 kit 0   lisinopril  (ZESTRIL ) 40 MG tablet Take 1 tablet (40 mg total) by mouth daily. 30 tablet 2   lisinopril  (ZESTRIL ) 40 MG tablet Take 1 tablet (40 mg total) by mouth daily. 90 tablet 3   Multiple Vitamin (MULTIVITAMIN WITH MINERALS) TABS tablet Take 1 tablet by mouth daily.     ondansetron  (ZOFRAN -ODT) 4 MG disintegrating tablet Take 1 tablet (4 mg total) by mouth every 8 (eight) hours as needed for nausea or vomiting. 20 tablet 0   rosuvastatin  (CRESTOR ) 20 MG tablet Take 1 tablet (20 mg total) by mouth daily. 30 tablet 2   No current facility-administered medications on file prior to visit.     Review of Systems: ROS negative except for what is noted on the assessment and plan.  Vitals:   11/19/23 0906 11/19/23 0949  BP: (!) 142/70 (!) 148/90  Pulse: 70 78  Temp: 98.3 F (36.8 C)   TempSrc: Oral   SpO2: 100%   Weight: 197 lb (89.4 kg)     Last CBC Lab Results  Component Value Date   WBC 9.7 09/25/2023   HGB 13.6 09/25/2023   HCT 43.2 09/25/2023   MCV 78.4 (L) 09/25/2023   MCH 24.7 (L) 09/25/2023   RDW 14.6 09/25/2023   PLT 238 09/25/2023   Last hemoglobin A1c Lab Results  Component Value Date   HGBA1C 11.1 (A) 11/19/2023     Physical Exam: Constitutional: NAD Cardiovascular: regular rate and rhythm, no m/r/g Pulmonary/Chest: normal work of breathing on room air, lungs clear to auscultation bilaterally Abdominal: soft, non-tender, non-distended Psych: Depressed mood, anxious.  Denies hallucination.  Assessment & Plan:   Patient discussed with Dr. Lelia Putnam  Diabetes mellitus with microalbuminuric  diabetic nephropathy (HCC) Uncontrolled type 2 diabetes mellitus with hyperglycemia (HCC) Uncontrolled type 2 diabetes, A1c 10.6 >>11.1.  OP regimen is jardiance  25 mg daily, insulin  degludec 25 units twice daily, She has been taking a reduced dosing of the degludec as a result of diarrhea; she is doing 15 units twice daily instead.  Previously on Ozempic , which was discontinued as a result of a episode of pancreatitis.  Cannot tolerate metformin  due to GI side effects. She has a Dexcom at home, for the past 14 days, she has only been in range 5%.  Average blood glucose 260.  Patient is defiant about starting Ozempic  again, however educated patient about inability to do so due to previous history of pancreatitis.  Given uncontrolled sugars, would increase Tresiba  to 30 units, however patient is not agreeable due to her reported diarrhea with insulin  use. Explore the possibility of switching to glargine, patient is again not agreeable.  I worry and wonder if this patient really understands her disease process and her reported side effect.  It is possible that her reported diarrhea could be secondary to diabetic enteropathy, not necessarily from the insulin . Plan:  -Continue Tresiba  25 units twice daily.  Encourage patient to do 30 if she can. -Continue with Jardiance  25 mg.  Essential hypertension BP improved, but still elevated.  OP regimen is lisinopril  40 mg daily.  At last office visit, amlodipine  10 mg was added, however she is not taking this medicine, she reports having side effect from it, unable to tell me what side effect.  Plan: - BMP today.  - Continue lisinopril  40 mg daily. - Could consider spironolactone or beta-blocker for blood pressure control next office visit.  Hyperlipidemia Lipids were checked 6 months ago, OP regimen includes rosuvastatin  20 mg. Plan:   -Continue with the plan as above.  Marni Sins, MD [facility] Internal Medicine, PGY-1 Pager: [phone] Date  11/20/2023 Time 4:17 PM

## 2023-11-19 NOTE — Patient Instructions (Signed)
 It was a pleasure taking care of you today!    For diabetes, continue with the Tresiba  25 units twice daily.  Continue with Jardiance  25 mg.  2.  For hypertension, continue with lisinopril  40 mg.    3.  Please continue to monitor your blood sugars.  I have ordered the following labs for you:   Lab Orders         Glucose, capillary         BMP8         POC Hbg A1C       Follow up: 4 weeks    Should you have any questions or concerns please call the internal medicine clinic at [phone].     Marni Sins, MD  [facility]

## 2023-11-20 LAB — BMP8
BUN: 30 mg/dL — ABNORMAL HIGH (ref 8–27)
CO2: 19 mmol/L — ABNORMAL LOW (ref 20–29)
Calcium: 9.1 mg/dL (ref 8.7–10.3)
Chloride: 105 mmol/L (ref 96–106)
Creatinine, Ser: 1.28 mg/dL — ABNORMAL HIGH (ref 0.57–1.00)
Glucose: 236 mg/dL — ABNORMAL HIGH (ref 70–99)
Potassium: 5.2 mmol/L (ref 3.5–5.2)
Sodium: 139 mmol/L (ref 134–144)
eGFR: 48 mL/min/{1.73_m2} — ABNORMAL LOW (ref >59)

## 2023-11-20 NOTE — Assessment & Plan Note (Signed)
 Uncontrolled type 2 diabetes mellitus with hyperglycemia (HCC) Uncontrolled type 2 diabetes, A1c 10.6 >>11.1.  OP regimen is jardiance  25 mg daily, insulin  degludec 25 units twice daily, She has been taking a reduced dosing of the degludec as a result of diarrhea; she is doing 15 units twice daily instead.  Previously on Ozempic , which was discontinued as a result of a episode of pancreatitis.  Cannot tolerate metformin  due to GI side effects. She has a Dexcom at home, for the past 14 days, she has only been in range 5%.  Average blood glucose 260.  Patient is defiant about starting Ozempic  again, however educated patient about inability to do so due to previous history of pancreatitis.  Given uncontrolled sugars, would increase Tresiba  to 30 units, however patient is not agreeable due to her reported diarrhea with insulin  use. Explore the possibility of switching to glargine, patient is again not agreeable.  I worry and wonder if this patient really understands her disease process and her reported side effect.  It is possible that her reported diarrhea could be secondary to diabetic enteropathy, not necessarily from the insulin . Plan:  -Continue Tresiba  25 units twice daily.  Encourage patient to do 30 if she can. -Continue with Jardiance  25 mg.

## 2023-11-20 NOTE — Progress Notes (Signed)
 Internal Medicine Clinic Attending  Case discussed with the resident at the time of the visit.  We reviewed the resident's history and exam and pertinent patient test results.  I agree with the assessment, diagnosis, and plan of care documented in the resident's note.

## 2023-11-20 NOTE — Assessment & Plan Note (Signed)
 BP improved, but still elevated.  OP regimen is lisinopril  40 mg daily.  At last office visit, amlodipine  10 mg was added, however she is not taking this medicine, she reports having side effect from it, unable to tell me what side effect.  Plan: - BMP today.  - Continue lisinopril  40 mg daily. - Could consider spironolactone or beta-blocker for blood pressure control next office visit.

## 2023-11-24 ENCOUNTER — Other Ambulatory Visit (HOSPITAL_COMMUNITY): Payer: Self-pay

## 2023-11-24 ENCOUNTER — Other Ambulatory Visit: Payer: Self-pay | Admitting: Internal Medicine

## 2023-11-24 DIAGNOSIS — E785 Hyperlipidemia, unspecified: Secondary | ICD-10-CM

## 2023-11-24 MED ORDER — ROSUVASTATIN CALCIUM 20 MG PO TABS
20.0000 mg | ORAL_TABLET | Freq: Every day | ORAL | 2 refills | Status: DC
  Filled 2023-11-24 – 2024-02-01 (×5): qty 30, 30d supply, fill #0
  Filled 2024-03-28: qty 30, 30d supply, fill #1

## 2023-11-24 NOTE — Telephone Encounter (Signed)
 Medication sent to pharmacy

## 2023-11-25 ENCOUNTER — Other Ambulatory Visit

## 2023-11-26 ENCOUNTER — Other Ambulatory Visit (HOSPITAL_COMMUNITY): Payer: Self-pay

## 2023-12-02 ENCOUNTER — Ambulatory Visit
Admission: RE | Admit: 2023-12-02 | Discharge: 2023-12-02 | Disposition: A | Discharge status: Home / Self Care | Source: Ambulatory Visit | Attending: Nephrology | Admitting: Nephrology

## 2023-12-02 DIAGNOSIS — N1831 Chronic kidney disease, stage 3a: Secondary | ICD-10-CM

## 2023-12-11 ENCOUNTER — Other Ambulatory Visit (HOSPITAL_COMMUNITY): Payer: Self-pay

## 2023-12-11 ENCOUNTER — Other Ambulatory Visit: Payer: Self-pay

## 2023-12-14 ENCOUNTER — Other Ambulatory Visit (HOSPITAL_BASED_OUTPATIENT_CLINIC_OR_DEPARTMENT_OTHER): Payer: Self-pay

## 2023-12-14 ENCOUNTER — Other Ambulatory Visit (HOSPITAL_COMMUNITY): Payer: Self-pay

## 2023-12-29 ENCOUNTER — Other Ambulatory Visit: Payer: Self-pay

## 2023-12-29 ENCOUNTER — Other Ambulatory Visit (HOSPITAL_COMMUNITY): Payer: Self-pay

## 2023-12-29 ENCOUNTER — Other Ambulatory Visit: Payer: Self-pay | Admitting: Student

## 2023-12-29 DIAGNOSIS — E1121 Type 2 diabetes mellitus with diabetic nephropathy: Secondary | ICD-10-CM

## 2023-12-29 DIAGNOSIS — E1165 Type 2 diabetes mellitus with hyperglycemia: Secondary | ICD-10-CM

## 2023-12-29 MED ORDER — TRESIBA FLEXTOUCH 100 UNIT/ML ~~LOC~~ SOPN
25.0000 [IU] | PEN_INJECTOR | Freq: Two times a day (BID) | SUBCUTANEOUS | 1 refills | Status: DC
  Filled 2023-12-29: qty 15, 30d supply, fill #0
  Filled 2024-03-28 – 2024-04-11 (×3): qty 15, 30d supply, fill #1

## 2023-12-29 MED ORDER — DEXCOM G7 SENSOR MISC
3 refills | Status: AC
  Filled 2023-12-29: qty 3, 30d supply, fill #0
  Filled 2024-02-01 – 2024-02-02 (×3): qty 3, 30d supply, fill #1
  Filled 2024-02-16 – 2024-02-26 (×3): qty 3, 30d supply, fill #2
  Filled 2024-03-28: qty 3, 30d supply, fill #3
  Filled 2024-04-20: qty 3, 30d supply, fill #4
  Filled 2024-05-06 – 2024-05-18 (×4): qty 3, 30d supply, fill #5
  Filled 2024-07-11 – 2024-07-13 (×2): qty 3, 30d supply, fill #0

## 2023-12-29 NOTE — Telephone Encounter (Signed)
 Patient called. States she has been having issues getting refills. She needs everything refilled that can be refilled. Thank You

## 2023-12-30 ENCOUNTER — Other Ambulatory Visit (HOSPITAL_COMMUNITY): Payer: Self-pay

## 2023-12-30 ENCOUNTER — Telehealth: Payer: Self-pay | Admitting: *Deleted

## 2023-12-30 NOTE — Telephone Encounter (Signed)
 RTC to patient.  Patient had called earlier asking to speak with management.  Doris Leafy the Engineer, manufacturing is away today.  Patient hung up before Hme, CMA was able to transfer her.  Message was left that the clinics had returned a call to her.

## 2024-02-01 ENCOUNTER — Other Ambulatory Visit (HOSPITAL_COMMUNITY): Payer: Self-pay

## 2024-02-01 ENCOUNTER — Telehealth (HOSPITAL_COMMUNITY): Payer: Self-pay

## 2024-02-01 ENCOUNTER — Telehealth (HOSPITAL_COMMUNITY): Payer: Self-pay | Admitting: Pharmacy Technician

## 2024-02-01 NOTE — Telephone Encounter (Signed)
 Pharmacy Patient Advocate Encounter   Received notification from Pt Calls Messages that prior authorization for Dexcom G7 Sensors is required/requested.   Insurance verification completed.   The patient is insured through [facility] MEDICAID([employer]) .   Per test claim: PA required; PA submitted to above mentioned insurance via CoverMyMeds Key/confirmation #/EOC [facility] Status is pending

## 2024-02-01 NOTE — Telephone Encounter (Signed)
 PA request has been Received. New Encounter has been or will be created for follow up. For additional info see Pharmacy Prior Auth telephone encounter from 02/01/24.

## 2024-02-02 ENCOUNTER — Other Ambulatory Visit: Payer: Self-pay

## 2024-02-02 ENCOUNTER — Other Ambulatory Visit (HOSPITAL_COMMUNITY): Payer: Self-pay

## 2024-02-02 NOTE — Telephone Encounter (Signed)
 Pharmacy Patient Advocate Encounter  Received notification from [employer] that Prior Authorization for Dexcom G7 sensor has been APPROVED from 02/01/24 to 08/03/24   PA #/Case ID/Reference #: PA Case ID #: [id number]

## 2024-02-16 ENCOUNTER — Other Ambulatory Visit (HOSPITAL_COMMUNITY): Payer: Self-pay

## 2024-02-26 ENCOUNTER — Other Ambulatory Visit: Payer: Self-pay

## 2024-02-26 ENCOUNTER — Other Ambulatory Visit (HOSPITAL_COMMUNITY): Payer: Self-pay

## 2024-03-02 ENCOUNTER — Telehealth (HOSPITAL_COMMUNITY): Payer: Self-pay

## 2024-03-02 ENCOUNTER — Other Ambulatory Visit: Payer: Self-pay

## 2024-03-02 ENCOUNTER — Other Ambulatory Visit (HOSPITAL_COMMUNITY): Payer: Self-pay

## 2024-03-02 NOTE — Telephone Encounter (Signed)
 Pharmacy Patient Advocate Encounter  Received notification from [facility] MEDICAID that Prior Authorization for Jardiance  25MG  tablets  has been APPROVED from 03/02/24 to 03/02/25. Ran test claim, Copay is $4. This test claim was processed through [facility]- copay amounts may vary at other pharmacies due to pharmacy/plan contracts, or as the patient moves through the different stages of their insurance plan.   PA #/Case ID/Reference #: [id number]

## 2024-03-02 NOTE — Telephone Encounter (Signed)
 Pharmacy Patient Advocate Encounter   Received notification from Pt Calls Messages that prior authorization for Jardiance  25MG  tablets  is required/requested.   Insurance verification completed.   The patient is insured through [facility] MEDICAID .   Per test claim: PA required; PA submitted to above mentioned insurance via Latent Key/confirmation #/EOC [id number] Status is pending

## 2024-03-02 NOTE — Telephone Encounter (Signed)
 PA request has been Received. New Encounter has been or will be created for follow up. For additional info see Pharmacy Prior Auth telephone encounter from 03/03/24.

## 2024-03-03 ENCOUNTER — Other Ambulatory Visit (HOSPITAL_COMMUNITY): Payer: Self-pay

## 2024-03-07 ENCOUNTER — Other Ambulatory Visit (HOSPITAL_COMMUNITY): Payer: Self-pay

## 2024-03-28 ENCOUNTER — Other Ambulatory Visit (HOSPITAL_COMMUNITY): Payer: Self-pay

## 2024-03-28 ENCOUNTER — Telehealth (HOSPITAL_COMMUNITY): Payer: Self-pay

## 2024-03-28 NOTE — Telephone Encounter (Signed)
 Pharmacy Patient Advocate Encounter  Received notification from [facility] MEDICAID that Prior Authorization for Tresiba  FlexTouch (insulin  degludec injection) 100 Units/mL solution  has been DENIED.  See denial reason below. No denial letter attached in CMM. Will attach denial letter to Media tab once received.   PA #/Case ID/Reference #: [id number]

## 2024-03-28 NOTE — Telephone Encounter (Signed)
 Pharmacy Patient Advocate Encounter   Received notification from Pt Calls Messages that prior authorization for Tresiba  FlexTouch (insulin  degludec injection) 100 Units/mL solution  is required/requested.   Insurance verification completed.   The patient is insured through [facility] MEDICAID .   Per test claim: PA required; PA submitted to above mentioned insurance via Latent Key/confirmation #/EOC [id number] Status is pending

## 2024-03-28 NOTE — Telephone Encounter (Signed)
 PA request has been Received. New Encounter has been or will be created for follow up. For additional info see Pharmacy Prior Auth telephone encounter from 03/28/24.

## 2024-03-31 ENCOUNTER — Other Ambulatory Visit (HOSPITAL_COMMUNITY): Payer: Self-pay

## 2024-04-01 NOTE — Telephone Encounter (Signed)
 I will route to appeals team.

## 2024-04-04 ENCOUNTER — Other Ambulatory Visit (HOSPITAL_COMMUNITY): Payer: Self-pay

## 2024-04-04 NOTE — Telephone Encounter (Signed)
 Prior authorization submitted for TRESIBA  to [facility] MEDICAID via Latent.   Key: [id number]

## 2024-04-04 NOTE — Telephone Encounter (Signed)
 Will resubmit PA and include failure of Lantus  and Levemir .

## 2024-04-06 ENCOUNTER — Ambulatory Visit: Payer: Self-pay

## 2024-04-06 NOTE — Telephone Encounter (Addendum)
 Received a call from E2C[facility], I spoke to the patient she stated that her sugar has been high-around 250, right now her sugar is 311. Patient stated she has been out of her Tresiba  for a few days and her medication requires a prior authorization. Patient also stated she is having leg pain and wanted to schedule a appointment. Patient is scheduled for Monday October 13.  I called and spoke to [place] with [employer] she stated the patients insurance has denied the request- not sure who submitted this request as I do not see any documentation from the pharmacy or our pharmacists. The denial letter was sent to a different fax number. I have requested the denial letter and appeal information to be faxed to us . Patient is aware, I will give her a update as soon as I receive anything from her insurance.   Prior Authorization appeal form has been faxed by [facility] along with last office notes, labs and proof of trial/failure of levemir  and lantus .  Fax:[phone] Telephone:[phone]

## 2024-04-06 NOTE — Telephone Encounter (Signed)
 FYI Only or Action Required?: Action required by provider: clinical question for provider and update on patient condition.  Patient was last seen in primary care on 11/19/2023 by Celestina Czar, MD.  Called Nurse Triage reporting Blood Sugar Problem.  Symptoms began patient states out of Tresiba  for two days.  Triage Disposition: No disposition on file.  Patient/caregiver understands and will follow disposition?: No, wishes to speak with PCP  Transferred patient to CAL for further assistance   Copied from CRM #[id number]. Topic: Clinical - Red Word Triage >> Apr 06, 2024  8:45 AM Graeme ORN wrote: Red Word that prompted transfer to Nurse Triage: Sugar 250 or higher   ----------------------------------------------------------------------- From previous Reason for Contact - Scheduling: Patient/patient representative is calling to schedule an appointment. Refer to attachments for appointment information. Answer Assessment - Initial Assessment Questions Patient out of Tresiba ---patient states she hasn't had it in two days  Patient was states she was told she couldn't have Ozempic  and she wants to know what else she can have--she states it was stopped when she had an issue with her pancreatitis  Patient very frustrated with the triage process and unable to provider an exact blood sugar reading She states that she has a lot of things going on she states she always feels drained and her feet and legs hurt Patient states that she spoke with someone earlier and answered all these triage questions already. There was no noted previous triage by a nurse in the chart---patient states she wants an appointment with her doctor and is upset about this process. This RN attempted to explain but the patient was still frustrated about not being able to set an appointment and wanting to speak with someone directly at the office Patient did not want to answer anymore questions, stating that she had already  answered them earlier. For best patient satisfaction at this time, patient is connected with the CAL  This RN called CAL and advised them of the situation/symptoms and patient's current frustration and they advised to transfer her to them Patient is connected with CAL at this time via warm transfer for further assistance  Protocols used: Diabetes - High Blood Sugar-A-AH

## 2024-04-06 NOTE — Telephone Encounter (Signed)
 I called the patients insurance and spoke with Clotilda. Per Clotilda a decision will be made within 72 hours. I attempted to call the patient. I was unable to reach her, I lvm for her with the following information.

## 2024-04-07 ENCOUNTER — Other Ambulatory Visit (HOSPITAL_COMMUNITY): Payer: Self-pay

## 2024-04-07 NOTE — Telephone Encounter (Signed)
 Per insurance, chart notes will need to include that Lantus  was failed in the past.

## 2024-04-07 NOTE — Telephone Encounter (Signed)
 Pharmacy Patient Advocate Encounter  Received notification from [facility] MEDICAID that Prior Authorization for TRESIBA  has been DENIED.  Full denial letter will be uploaded to the media tab. See denial reason below.  Per your health plan's criteria, this drug is covered if you meet the following: If the request is for non-preferred, you have tried or cannot use one preferred long-acting insulin : Lantus  Vial, Lantus  Solostar  PA #/Case ID/Reference #: [id number]   Will reach out to insurance, lantus  failure was given in PA.

## 2024-04-11 ENCOUNTER — Other Ambulatory Visit: Payer: Self-pay

## 2024-04-11 ENCOUNTER — Other Ambulatory Visit (HOSPITAL_COMMUNITY): Payer: Self-pay

## 2024-04-11 ENCOUNTER — Ambulatory Visit (INDEPENDENT_AMBULATORY_CARE_PROVIDER_SITE_OTHER): Payer: Self-pay | Admitting: Student

## 2024-04-11 ENCOUNTER — Encounter: Payer: Self-pay | Admitting: Student

## 2024-04-11 VITALS — BP 125/81 | HR 80 | Temp 98.9°F | Ht 66.0 in | Wt 206.6 lb

## 2024-04-11 DIAGNOSIS — E782 Mixed hyperlipidemia: Secondary | ICD-10-CM

## 2024-04-11 DIAGNOSIS — E1121 Type 2 diabetes mellitus with diabetic nephropathy: Secondary | ICD-10-CM | POA: Diagnosis present

## 2024-04-11 DIAGNOSIS — Z83438 Family history of other disorder of lipoprotein metabolism and other lipidemia: Secondary | ICD-10-CM | POA: Diagnosis not present

## 2024-04-11 DIAGNOSIS — E1142 Type 2 diabetes mellitus with diabetic polyneuropathy: Secondary | ICD-10-CM

## 2024-04-11 DIAGNOSIS — Z8249 Family history of ischemic heart disease and other diseases of the circulatory system: Secondary | ICD-10-CM | POA: Diagnosis not present

## 2024-04-11 DIAGNOSIS — Z7984 Long term (current) use of oral hypoglycemic drugs: Secondary | ICD-10-CM

## 2024-04-11 DIAGNOSIS — R252 Cramp and spasm: Secondary | ICD-10-CM

## 2024-04-11 DIAGNOSIS — Z79899 Other long term (current) drug therapy: Secondary | ICD-10-CM | POA: Diagnosis not present

## 2024-04-11 DIAGNOSIS — E785 Hyperlipidemia, unspecified: Secondary | ICD-10-CM

## 2024-04-11 DIAGNOSIS — Z794 Long term (current) use of insulin: Secondary | ICD-10-CM | POA: Diagnosis not present

## 2024-04-11 DIAGNOSIS — Z833 Family history of diabetes mellitus: Secondary | ICD-10-CM

## 2024-04-11 LAB — POCT GLYCOSYLATED HEMOGLOBIN (HGB A1C): HbA1c, POC (controlled diabetic range): 11.3 % — AB (ref 0.0–7.0)

## 2024-04-11 LAB — GLUCOSE, CAPILLARY: Glucose-Capillary: 243 mg/dL — ABNORMAL HIGH (ref 70–99)

## 2024-04-11 MED ORDER — METHOCARBAMOL 500 MG PO TABS
500.0000 mg | ORAL_TABLET | Freq: Three times a day (TID) | ORAL | 2 refills | Status: AC | PRN
  Filled 2024-04-11: qty 42, 14d supply, fill #0
  Filled 2024-05-06 (×2): qty 102, 34d supply, fill #0

## 2024-04-11 NOTE — Telephone Encounter (Signed)
 I called and spoke to Sam L. With the patients insurance. The prior authorization for Tresiba  has been approved effective 04/04/2024-04/07/2025.  Patient is aware of the approval.

## 2024-04-11 NOTE — Assessment & Plan Note (Addendum)
 Progressing. On gabapentin  600 TID. Does not want to consider cymbalta. She has leg cramping as well that occurs most nights below the bilateral knees. Recent electrolytes WNL, not dehydrated. She states that foods such as bananas, greens (high K) reduce the severity. Will trial a muscle relaxer for the evenings. - methocarbamol 500 TID prn

## 2024-04-11 NOTE — Progress Notes (Addendum)
 CC: Diabetes follow up  HPI:  [name] is a 63 y.o. female with a PMH stated below who presents today for diabetes. She would like to discuss her neuropathy and leg pain  Please see problem based assessment and plan for additional details.  Past Medical History:  Diagnosis Date   Diabetes mellitus    DVT of lower limb, acute (HCC) Age 46   Went on blood thinner for a few months   Hyperlipidemia    Left breast lump 06/01/2012   Left breast lump palpated at 3:30 o'clock 5 cm from the nipple. [facility] for Diagnostic Mammogram and possible left breast ultrasound. Appointment scheduled for Wednesday, June 09, 2012 at 0900.         Pancreatitis    Vertigo     Review of Systems: ROS negative except for what is noted on the assessment and plan.  Vitals:   04/11/24 0838 04/11/24 0937  BP: 135/79 125/81  Pulse: 81 80  Temp: 98.9 F (37.2 C)   TempSrc: Oral   SpO2: 91%   Weight: 206 lb 9.6 oz (93.7 kg)   Height: 5' 6 (1.676 m)     Physical Exam: Constitutional: well-appearing woman in no acute distress Cardiovascular: regular rate and rhythm, no m/r/g Pulmonary/Chest: normal work of breathing on room air, lungs clear to auscultation bilaterally Abdominal: soft, non-tender, non-distended MSK: normal bulk and tone Skin: warm and dry  Assessment & Plan:   Patient discussed with Dr. 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 Has been elevated for a long time. Current only taking jardiance  25mg  daily. Was on insulin  degludec (Tresiba ) until she ran out a few weeks ago. That medicine also since lost insurance approval. Prior to that, was on lantus  but the regimen was very high dose and was not effective. Was also on ozempic  but discontinued in setting of pancreatitis, but she has concomitant hypertriglyceridemia. Failed metformin  due to GI effects.  Not at goal and effects of  uncontrolled diabetes are progressing. She has worsening neuropathy and has kidney disease. Continue insulin  and SGLT2 inhibitor. I believe a GLP1 agonist is appropriate as well. Link between GLP1s and pancreatitis appear weaker than believed with recent studies and her hypertriglyceridemia a more direct cause. Further diabetic control may reduce risk of pancreatitis anyways. If we can control her triglycerides, I believe we can attempt GLP1 treatment, which I shared with her.  - Continue jardiance  25 - Continue Tresiba  25 BID, approved today and she is to receive a supply shortly - Check lipids and modify as needed, and if controlled anticipate starting a GLP1 agonist.  Diabetic peripheral neuropathy (HCC) Progressing. On gabapentin  600 TID. Does not want to consider cymbalta. She has leg cramping as well that occurs most nights below the bilateral knees. Recent electrolytes WNL, not dehydrated. She states that foods such as bananas, greens (high K) reduce the severity. Will trial a muscle relaxer for the evenings. - methocarbamol 500 TID prn  Hyperlipidemia Recheck lipids today. Suspect this contributes to her history of recurrent pancreatitis. Reports adherence to crestor  20 daily. Would like to see Tgs below 500. Would add fenofibrate if higher. Anticipate GLP medicine pending this, per above.   Given poor control and medicine changes, RTC one month after starting GLP1 for medicine effect evaluation and to ensure resumption of tresiba  is going well.  ADD: triglycerides high and I am starting fenofibrate. Side effects discussed namely myopathy,  precautions for cessation and contacting clinic given. I will request a follow up in 6 weeks and that she present in a fasted state. A future lipid panel is ordered. With improvement in TGs, can consider GLP1 therapy.  Lonni Africa, D.O. [facility] Internal Medicine, PGY-2 Phone: [phone] Date 04/11/2024 Time 11:57 AM

## 2024-04-11 NOTE — Assessment & Plan Note (Signed)
 A1c above 11. Has been elevated for a long time. Current only taking jardiance  25mg  daily. Was on insulin  degludec (Tresiba ) until she ran out a few weeks ago. That medicine also since lost insurance approval. Prior to that, was on lantus  but the regimen was very high dose and was not effective. Was also on ozempic  but discontinued in setting of pancreatitis, but she has concomitant hypertriglyceridemia. Failed metformin  due to GI effects.  Not at goal and effects of uncontrolled diabetes are progressing. She has worsening neuropathy and has kidney disease. Continue insulin  and SGLT2 inhibitor. I believe a GLP1 agonist is appropriate as well. Link between the above medicines and pancreatitis appear weaker than believed with recent studies and her hypertriglyceridemia a more direct cause. Further diabetic control may reduce risk of pancreatitis anyways.  - Continue jardiance  25 - Continue Tresiba  25 BID, approved today and she is to receive a supply shortly - Check lipids and modify as needed, and if controlled anticipate starting a GLP1 agonist.

## 2024-04-11 NOTE — Assessment & Plan Note (Addendum)
 Recheck lipids today. Suspect this contributes to her history of recurrent pancreatitis. Reports adherence to crestor  20 daily. Would like to see Tgs below 500. Would add fenofibrate if higher. Anticipate GLP medicine pending this, per above.

## 2024-04-11 NOTE — Patient Instructions (Signed)
 Today I will check you blood cholesterol levels. That will help guide the best way to take care of your diabetes. Please expect a call from me later this week or early next week with the results as well as the insurance situation. We can talk about the best plan for you then.

## 2024-04-12 ENCOUNTER — Other Ambulatory Visit: Payer: Self-pay

## 2024-04-12 ENCOUNTER — Other Ambulatory Visit (HOSPITAL_COMMUNITY): Payer: Self-pay

## 2024-04-12 LAB — LIPID PANEL
Chol/HDL Ratio: 6.4 ratio — ABNORMAL HIGH (ref 0.0–4.4)
Cholesterol, Total: 192 mg/dL (ref 100–199)
HDL: 30 mg/dL — ABNORMAL LOW (ref >39)
LDL Chol Calc (NIH): 76 mg/dL (ref 0–99)
Triglycerides: 544 mg/dL — ABNORMAL HIGH (ref 0–149)
VLDL Cholesterol Cal: 86 mg/dL — ABNORMAL HIGH (ref 5–40)

## 2024-04-12 NOTE — Progress Notes (Signed)
 Internal Medicine Clinic Attending  Case discussed with the resident at the time of the visit.  We reviewed the resident's history and exam and pertinent patient test results.  I agree with the assessment, diagnosis, and plan of care documented in the resident's note.   Triglycerides >500, so with her history of pancreatitis, I would not start GLP-1 until triglycerides are better controlled.  I recommend starting fenofibrate (in addition to crestor ), and counseling patient on the risk of possible muscle toxicity. If any muscle aches, we should get a CK. She had baseline LFTs checked earlier this year, which were normal.   She could also start icosapent ethyl - either now or at her 46-month follow-up visit -- to further lower triglycerides.   She should be fasting at next appointment, so we can check fasting triglycerides then.

## 2024-04-13 ENCOUNTER — Ambulatory Visit: Payer: Self-pay | Admitting: Student

## 2024-04-13 ENCOUNTER — Other Ambulatory Visit (HOSPITAL_COMMUNITY): Payer: Self-pay

## 2024-04-13 DIAGNOSIS — E781 Pure hyperglyceridemia: Secondary | ICD-10-CM

## 2024-04-13 MED ORDER — FENOFIBRATE 48 MG PO TABS
48.0000 mg | ORAL_TABLET | Freq: Every day | ORAL | 3 refills | Status: AC
  Filled 2024-04-13 – 2024-05-06 (×3): qty 30, 30d supply, fill #0
  Filled 2024-06-15 (×2): qty 30, 30d supply, fill #1
  Filled 2024-07-28: qty 30, 30d supply, fill #2

## 2024-04-14 NOTE — Addendum Note (Signed)
 Addended by: HARRIE BRUCKNER on: 04/14/2024 10:10 AM   Modules accepted: Orders

## 2024-04-18 NOTE — Progress Notes (Signed)
 Internal Medicine Clinic Attending  Case discussed with the resident at the time of the visit.  We reviewed the resident's history and exam and pertinent patient test results.  I agree with the assessment, diagnosis, and plan of care documented in the resident's note.

## 2024-04-20 ENCOUNTER — Other Ambulatory Visit (HOSPITAL_COMMUNITY): Payer: Self-pay

## 2024-04-22 ENCOUNTER — Other Ambulatory Visit (HOSPITAL_COMMUNITY): Payer: Self-pay

## 2024-05-06 ENCOUNTER — Other Ambulatory Visit (HOSPITAL_COMMUNITY): Payer: Self-pay

## 2024-05-06 ENCOUNTER — Other Ambulatory Visit: Payer: Self-pay | Admitting: Student

## 2024-05-06 DIAGNOSIS — E785 Hyperlipidemia, unspecified: Secondary | ICD-10-CM

## 2024-05-06 DIAGNOSIS — E1165 Type 2 diabetes mellitus with hyperglycemia: Secondary | ICD-10-CM

## 2024-05-09 ENCOUNTER — Other Ambulatory Visit: Payer: Self-pay

## 2024-05-09 ENCOUNTER — Other Ambulatory Visit (HOSPITAL_COMMUNITY): Payer: Self-pay

## 2024-05-09 MED ORDER — TRESIBA FLEXTOUCH 100 UNIT/ML ~~LOC~~ SOPN
25.0000 [IU] | PEN_INJECTOR | Freq: Two times a day (BID) | SUBCUTANEOUS | 1 refills | Status: AC
  Filled 2024-05-09: qty 15, 30d supply, fill #0
  Filled 2024-06-15 (×2): qty 15, 30d supply, fill #1

## 2024-05-09 MED ORDER — ROSUVASTATIN CALCIUM 20 MG PO TABS
20.0000 mg | ORAL_TABLET | Freq: Every day | ORAL | 2 refills | Status: AC
  Filled 2024-05-09: qty 30, 30d supply, fill #0
  Filled 2024-06-15 (×2): qty 30, 30d supply, fill #1
  Filled 2024-07-28: qty 30, 30d supply, fill #2

## 2024-05-09 NOTE — Telephone Encounter (Signed)
 Medication sent to pharmacy

## 2024-05-10 ENCOUNTER — Other Ambulatory Visit: Payer: Self-pay

## 2024-05-17 LAB — COLOGUARD: COLOGUARD: POSITIVE — AB

## 2024-05-18 ENCOUNTER — Other Ambulatory Visit (HOSPITAL_COMMUNITY): Payer: Self-pay

## 2024-05-20 DIAGNOSIS — E1122 Type 2 diabetes mellitus with diabetic chronic kidney disease: Secondary | ICD-10-CM | POA: Diagnosis not present

## 2024-05-20 DIAGNOSIS — I129 Hypertensive chronic kidney disease with stage 1 through stage 4 chronic kidney disease, or unspecified chronic kidney disease: Secondary | ICD-10-CM | POA: Diagnosis not present

## 2024-05-20 DIAGNOSIS — R809 Proteinuria, unspecified: Secondary | ICD-10-CM | POA: Diagnosis not present

## 2024-05-20 DIAGNOSIS — E1029 Type 1 diabetes mellitus with other diabetic kidney complication: Secondary | ICD-10-CM | POA: Diagnosis not present

## 2024-05-20 DIAGNOSIS — N1831 Chronic kidney disease, stage 3a: Secondary | ICD-10-CM | POA: Diagnosis not present

## 2024-05-20 LAB — LAB REPORT - SCANNED
Albumin, Urine POC: 575.5
Microalb Creat Ratio: 1056

## 2024-06-01 ENCOUNTER — Other Ambulatory Visit (HOSPITAL_COMMUNITY): Payer: Self-pay

## 2024-06-01 ENCOUNTER — Other Ambulatory Visit: Payer: Self-pay | Admitting: Internal Medicine

## 2024-06-01 DIAGNOSIS — Z794 Long term (current) use of insulin: Secondary | ICD-10-CM

## 2024-06-01 DIAGNOSIS — E1121 Type 2 diabetes mellitus with diabetic nephropathy: Secondary | ICD-10-CM

## 2024-06-01 MED ORDER — EMPAGLIFLOZIN 25 MG PO TABS
25.0000 mg | ORAL_TABLET | Freq: Every day | ORAL | 2 refills | Status: AC
  Filled 2024-06-01: qty 90, 90d supply, fill #0

## 2024-06-02 ENCOUNTER — Other Ambulatory Visit (HOSPITAL_COMMUNITY): Payer: Self-pay

## 2024-06-02 ENCOUNTER — Other Ambulatory Visit: Payer: Self-pay

## 2024-06-03 ENCOUNTER — Other Ambulatory Visit (HOSPITAL_COMMUNITY): Payer: Self-pay

## 2024-06-03 ENCOUNTER — Other Ambulatory Visit: Payer: Self-pay

## 2024-06-15 ENCOUNTER — Other Ambulatory Visit (HOSPITAL_COMMUNITY): Payer: Self-pay

## 2024-06-15 ENCOUNTER — Other Ambulatory Visit: Payer: Self-pay

## 2024-06-17 ENCOUNTER — Other Ambulatory Visit (HOSPITAL_COMMUNITY): Payer: Self-pay

## 2024-06-17 ENCOUNTER — Other Ambulatory Visit: Payer: Self-pay

## 2024-06-27 ENCOUNTER — Other Ambulatory Visit (HOSPITAL_COMMUNITY): Payer: Self-pay

## 2024-07-11 ENCOUNTER — Other Ambulatory Visit: Payer: Self-pay

## 2024-07-11 ENCOUNTER — Other Ambulatory Visit (HOSPITAL_COMMUNITY): Payer: Self-pay

## 2024-07-13 ENCOUNTER — Other Ambulatory Visit: Payer: Self-pay

## 2024-07-13 ENCOUNTER — Other Ambulatory Visit (HOSPITAL_COMMUNITY): Payer: Self-pay

## 2024-07-20 ENCOUNTER — Other Ambulatory Visit: Payer: Self-pay | Admitting: Internal Medicine

## 2024-07-20 ENCOUNTER — Other Ambulatory Visit (HOSPITAL_COMMUNITY): Payer: Self-pay

## 2024-07-20 ENCOUNTER — Other Ambulatory Visit: Payer: Self-pay

## 2024-07-20 DIAGNOSIS — I10 Essential (primary) hypertension: Secondary | ICD-10-CM

## 2024-07-20 MED ORDER — AMLODIPINE BESYLATE 10 MG PO TABS
10.0000 mg | ORAL_TABLET | Freq: Every day | ORAL | 11 refills | Status: AC
  Filled 2024-07-20: qty 30, 30d supply, fill #0

## 2024-07-20 NOTE — Telephone Encounter (Signed)
 Medication sent to pharmacy

## 2024-07-28 ENCOUNTER — Other Ambulatory Visit (HOSPITAL_COMMUNITY): Payer: Self-pay

## 2024-08-10 ENCOUNTER — Ambulatory Visit: Payer: Self-pay | Admitting: Student
